# Patient Record
Sex: Male | Born: 1965
Health system: Southern US, Community
[De-identification: ages and names within clinical notes are randomized; demographics above are authoritative.]

## PROBLEM LIST (undated history)

## (undated) DIAGNOSIS — F419 Anxiety disorder, unspecified: Secondary | ICD-10-CM

## (undated) DIAGNOSIS — M51369 Other intervertebral disc degeneration, lumbar region without mention of lumbar back pain or lower extremity pain: Secondary | ICD-10-CM

## (undated) DIAGNOSIS — I471 Supraventricular tachycardia, unspecified: Secondary | ICD-10-CM

## (undated) DIAGNOSIS — E039 Hypothyroidism, unspecified: Secondary | ICD-10-CM

## (undated) DIAGNOSIS — E785 Hyperlipidemia, unspecified: Secondary | ICD-10-CM

## (undated) DIAGNOSIS — T8859XA Other complications of anesthesia, initial encounter: Secondary | ICD-10-CM

## (undated) DIAGNOSIS — F32A Depression, unspecified: Secondary | ICD-10-CM

## (undated) DIAGNOSIS — M5136 Other intervertebral disc degeneration, lumbar region: Secondary | ICD-10-CM

## (undated) DIAGNOSIS — I10 Essential (primary) hypertension: Secondary | ICD-10-CM

## (undated) HISTORY — DX: Other intervertebral disc degeneration, lumbar region without mention of lumbar back pain or lower extremity pain: M51.369

## (undated) HISTORY — PX: BACK SURGERY: SHX140

## (undated) HISTORY — DX: Other intervertebral disc degeneration, lumbar region: M51.36

## (undated) HISTORY — DX: Hyperlipidemia, unspecified: E78.5

## (undated) HISTORY — DX: Depression, unspecified: F32.A

## (undated) HISTORY — DX: Supraventricular tachycardia, unspecified: I47.10

## (undated) HISTORY — DX: Anxiety disorder, unspecified: F41.9

---

## 1998-04-01 ENCOUNTER — Ambulatory Visit (HOSPITAL_COMMUNITY): Admission: RE | Admit: 1998-04-01 | Discharge: 1998-04-01 | Payer: Self-pay

## 1998-08-24 ENCOUNTER — Encounter: Payer: Self-pay | Admitting: Family Medicine

## 1998-08-24 ENCOUNTER — Ambulatory Visit (HOSPITAL_COMMUNITY): Admission: RE | Admit: 1998-08-24 | Discharge: 1998-08-24 | Payer: Self-pay | Admitting: Family Medicine

## 2002-06-10 ENCOUNTER — Ambulatory Visit (HOSPITAL_COMMUNITY): Admission: RE | Admit: 2002-06-10 | Discharge: 2002-06-10 | Payer: Self-pay | Admitting: Family Medicine

## 2002-06-10 ENCOUNTER — Encounter: Payer: Self-pay | Admitting: Family Medicine

## 2006-03-05 ENCOUNTER — Encounter: Payer: Self-pay | Admitting: Cardiology

## 2006-03-05 ENCOUNTER — Ambulatory Visit: Payer: Self-pay

## 2008-09-10 HISTORY — PX: THYROIDECTOMY: SHX17

## 2008-10-18 ENCOUNTER — Encounter: Admission: RE | Admit: 2008-10-18 | Discharge: 2008-10-18 | Payer: Self-pay | Admitting: Endocrinology

## 2008-10-20 ENCOUNTER — Encounter: Admission: RE | Admit: 2008-10-20 | Discharge: 2008-10-20 | Payer: Self-pay | Admitting: Endocrinology

## 2008-10-20 ENCOUNTER — Encounter (INDEPENDENT_AMBULATORY_CARE_PROVIDER_SITE_OTHER): Payer: Self-pay | Admitting: Diagnostic Radiology

## 2008-10-20 ENCOUNTER — Other Ambulatory Visit: Admission: RE | Admit: 2008-10-20 | Discharge: 2008-10-20 | Payer: Self-pay | Admitting: Diagnostic Radiology

## 2009-01-04 ENCOUNTER — Ambulatory Visit (HOSPITAL_COMMUNITY): Admission: RE | Admit: 2009-01-04 | Discharge: 2009-01-05 | Payer: Self-pay | Admitting: Surgery

## 2009-01-04 ENCOUNTER — Encounter (INDEPENDENT_AMBULATORY_CARE_PROVIDER_SITE_OTHER): Payer: Self-pay | Admitting: Surgery

## 2010-09-11 ENCOUNTER — Emergency Department (HOSPITAL_COMMUNITY)
Admission: EM | Admit: 2010-09-11 | Discharge: 2010-09-11 | Payer: Self-pay | Source: Home / Self Care | Admitting: Emergency Medicine

## 2010-12-20 LAB — COMPREHENSIVE METABOLIC PANEL
ALT: 60 U/L — ABNORMAL HIGH (ref 0–53)
Albumin: 3.9 g/dL (ref 3.5–5.2)
Alkaline Phosphatase: 62 U/L (ref 39–117)
Potassium: 4.6 mEq/L (ref 3.5–5.1)
Sodium: 140 mEq/L (ref 135–145)
Total Protein: 6.9 g/dL (ref 6.0–8.3)

## 2010-12-20 LAB — CBC
Hemoglobin: 16.2 g/dL (ref 13.0–17.0)
Platelets: 226 10*3/uL (ref 150–400)
RDW: 12.8 % (ref 11.5–15.5)
WBC: 8.5 10*3/uL (ref 4.0–10.5)

## 2010-12-20 LAB — CALCIUM
Calcium: 8.5 mg/dL (ref 8.4–10.5)
Calcium: 8.8 mg/dL (ref 8.4–10.5)

## 2010-12-20 LAB — DIFFERENTIAL
Basophils Relative: 0 % (ref 0–1)
Eosinophils Absolute: 0.1 10*3/uL (ref 0.0–0.7)
Eosinophils Relative: 1 % (ref 0–5)
Monocytes Absolute: 0.7 10*3/uL (ref 0.1–1.0)
Monocytes Relative: 8 % (ref 3–12)

## 2010-12-20 LAB — URINALYSIS, ROUTINE W REFLEX MICROSCOPIC
Bilirubin Urine: NEGATIVE
Glucose, UA: NEGATIVE mg/dL
Ketones, ur: NEGATIVE mg/dL
pH: 6 (ref 5.0–8.0)

## 2011-01-23 NOTE — Op Note (Signed)
Kenneth [name], Kenneth [name]              ACCOUNT NO.:  [id number]   MEDICAL RECORD NO.:  [id number]          PATIENT TYPE:  OIB   LOCATION:  1527                         FACILITY:  [facility]   PHYSICIAN:  Velora Heckler, MD      DATE OF BIRTH:  Jan 29, 1966   DATE OF PROCEDURE:  01/04/2009  DATE OF DISCHARGE:                               OPERATIVE REPORT   PREOPERATIVE DIAGNOSIS:  Bilateral thyroid nodules with cytologic  atypia.   POSTOPERATIVE DIAGNOSIS:  Bilateral thyroid nodules with cytologic  atypia.   PROCEDURE:  Total thyroidectomy.   SURGEON:  Velora Heckler, MD, FACS   ASSISTANT:  Anselm Pancoast. Zachery Dakins, M.D., FACS   ANESTHESIA:  General per Dr. Ronelle Nigh.   ESTIMATED BLOOD LOSS:  Minimal.   PREPARATION:  ChloraPrep.   COMPLICATIONS:  None.   INDICATIONS:  The patient is a 45 year old white male from [place],  [place].  He had undergone a screening ultrasound examination in  November of 2009 at his church.  On imaging of the carotid arteries, he  was noted with an incidental finding of thyroid nodule.  His primary  physician arranged for endocrine consultation with Dr. Dorisann Frames.  The patient underwent thyroid ultrasound in February of 2010, which  showed a normal size thyroid gland.  However, there was a 1.7-cm complex  nodule with calcifications in the left thyroid lobe and a 1.2-cm nodule  in the right thyroid lobe, again, with calcifications.  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  The  patient is now referred for resection for definitive diagnosis.   DESCRIPTION OF PROCEDURE:  The procedure was done in OR #11 at the  [facility].  The patient was brought to the  operating room, placed in a supine position on the operating room table.  Following administration of general anesthesia, the patient was prepped  and draped in the usual strict aseptic fashion.  After ascertaining that  an adequate  level of anesthesia been achieved, a Kocher incision was  made a #15 blade.  Dissection was carried through subcutaneous tissues  and platysma.  Hemostasis was obtained with electrocautery.  Skin flaps  were elevated cephalad and caudad from the thyroid notch to the sternal  notch.  A Mahorner self-retaining retractor was placed for exposure.  The strap muscles were incised in the midline with the electrocautery.  Dissection was begun on the left.  The strap muscles were reflected  laterally.  The left thyroid lobe was exposed.  The middle thyroid vein  was divided between Ligaclips with the Harmonic scalpel.  The inferior  venous tributaries were divided between Ligaclips with the Harmonic  scalpel.  The superior pole was dissected out.  The superior pole  vessels were ligated in continuity with 2-0 silk ties and divided with  the Harmonic scalpel.  The gland was rolled anteriorly.  Branches of the  inferior thyroid artery were divided between small Ligaclips with the  Harmonic scalpel.  The superior parathyroid gland was identified and  preserved.  The recurrent laryngeal nerve was identified and preserved.  The ligament of Allyson Sabal was transected with the electrocautery and the  gland was mobilized up and onto the anterior trachea.  There was a small  pyramidal lobe, which was dissected off of the thyroid cartilage with  the electrocautery and resected en bloc with the thyroid.  The isthmus  was mobilized across the midline.  A dry pack was placed in the left  neck.   Next we turned our attention to the right thyroid lobe.  Again, the  strap muscles were reflected laterally.  The right lobe was exposed.  It  contained multiple palpable nodules.  The middle vein was divided  between Ligaclips with the Harmonic scalpel.  The inferior venous  tributaries were ligated in continuity with 2-0 silk ties and divided  with the Harmonic scalpel.  The superior pole was dissected out and  divided  between medium Ligaclips with the Harmonic scalpel.  The gland  was rolled anteriorly.  Branches of the inferior thyroid artery were  divided between small Ligaclips.  The recurrent nerve was identified and  preserved.  The superior parathyroid gland was identified and preserved.  The ligament of Allyson Sabal was transected with the electrocautery and the  gland was excised off the anterior trachea.  The specimen was marked  with a suture in the left superior pole.  The entire thyroid gland was  submitted to pathology for review.  The neck was irrigated with warm  saline.  Surgicel was placed in the operative field bilaterally.  The  strap muscles were reapproximated in the midline with interrupted 3-0  Vicryl sutures.  The platysma was closed with interrupted 3-0 Vicryl  sutures.  The skin was closed with running 4-0 Monocryl subcuticular  suture.  The wound was washed and dried and benzoin and Steri-Strips  were applied.  Sterile dressings were applied.  The patient was awakened  from anesthesia and brought to the recovery room in stable condition.  The patient tolerated the procedure well.      Velora Heckler, MD  Electronically Signed     TMG/MEDQ  D:  01/04/2009  T:  01/04/2009  Job:  [id number]   cc:   Dorisann Frames, M.D.  Fax: [phone]   Ernestina Penna, M.D.  Fax: [phone]

## 2011-03-28 ENCOUNTER — Other Ambulatory Visit: Payer: Self-pay | Admitting: Neurosurgery

## 2011-03-28 DIAGNOSIS — M549 Dorsalgia, unspecified: Secondary | ICD-10-CM

## 2011-03-28 DIAGNOSIS — M5410 Radiculopathy, site unspecified: Secondary | ICD-10-CM

## 2011-04-02 MED ORDER — DEXTROSE 5 % IV SOLN
1.0000 g | Freq: Once | INTRAVENOUS | Status: AC
  Administered 2011-04-03: 1 g via INTRAVENOUS

## 2011-04-02 MED ORDER — SODIUM CHLORIDE 0.9 % IV SOLN
Freq: Once | INTRAVENOUS | Status: AC
  Administered 2011-04-03: 08:00:00 via INTRAVENOUS

## 2011-04-03 ENCOUNTER — Ambulatory Visit
Admission: RE | Admit: 2011-04-03 | Discharge: 2011-04-03 | Disposition: A | Discharge status: Home / Self Care | Payer: BC Managed Care – PPO | Source: Ambulatory Visit | Attending: Neurosurgery | Admitting: Neurosurgery

## 2011-04-03 ENCOUNTER — Other Ambulatory Visit: Payer: Self-pay | Admitting: Neurosurgery

## 2011-04-03 VITALS — BP 105/69 | HR 75 | Temp 98.4°F | Resp 14 | Ht 70.0 in | Wt 168.0 lb

## 2011-04-03 DIAGNOSIS — M5410 Radiculopathy, site unspecified: Secondary | ICD-10-CM

## 2011-04-03 DIAGNOSIS — M549 Dorsalgia, unspecified: Secondary | ICD-10-CM

## 2011-04-03 HISTORY — DX: Hypothyroidism, unspecified: E03.9

## 2011-04-03 HISTORY — DX: Essential (primary) hypertension: I10

## 2011-04-03 MED ORDER — FENTANYL CITRATE 0.05 MG/ML IJ SOLN
25.0000 ug | INTRAMUSCULAR | Status: DC | PRN
  Administered 2011-04-03: 100 ug via INTRAVENOUS

## 2011-04-03 MED ORDER — IOHEXOL 180 MG/ML  SOLN: 4.0000 mL | Freq: Once | INTRAMUSCULAR | Status: DC | PRN

## 2011-04-03 MED ORDER — IOHEXOL 180 MG/ML  SOLN
4.0000 mL | Freq: Once | INTRAMUSCULAR | Status: AC | PRN
  Administered 2011-04-03: 4 mL

## 2011-04-03 MED ORDER — KETOROLAC TROMETHAMINE 30 MG/ML IJ SOLN
30.0000 mg | Freq: Once | INTRAMUSCULAR | Status: AC
  Administered 2011-04-03: 30 mg via INTRAVENOUS

## 2011-04-03 MED ORDER — DEXTROSE-NACL 5-0.45 % IV SOLN: INTRAVENOUS | Status: DC

## 2011-04-03 MED ORDER — MIDAZOLAM HCL 2 MG/2ML IJ SOLN
1.0000 mg | INTRAMUSCULAR | Status: DC | PRN
  Administered 2011-04-03 (×2): 1 mg via INTRAVENOUS

## 2011-04-03 NOTE — Progress Notes (Addendum)
Reviewed health hx. with pt.  Takes thyroid meds, and has high bp. No other surgeries or problems noted. [phone] lungs clear bilaterally and heart sounds regular. Denies smoking.dd

## 2011-07-10 ENCOUNTER — Ambulatory Visit: Payer: BC Managed Care – PPO | Attending: Neurosurgery | Admitting: Physical Therapy

## 2011-07-10 DIAGNOSIS — M545 Low back pain, unspecified: Secondary | ICD-10-CM | POA: Insufficient documentation

## 2011-07-10 DIAGNOSIS — IMO0001 Reserved for inherently not codable concepts without codable children: Secondary | ICD-10-CM | POA: Insufficient documentation

## 2011-07-10 DIAGNOSIS — R5381 Other malaise: Secondary | ICD-10-CM | POA: Insufficient documentation

## 2011-07-12 ENCOUNTER — Ambulatory Visit: Payer: BC Managed Care – PPO | Attending: Neurosurgery | Admitting: *Deleted

## 2011-07-12 DIAGNOSIS — M545 Low back pain, unspecified: Secondary | ICD-10-CM | POA: Insufficient documentation

## 2011-07-12 DIAGNOSIS — R5381 Other malaise: Secondary | ICD-10-CM | POA: Insufficient documentation

## 2011-07-12 DIAGNOSIS — IMO0001 Reserved for inherently not codable concepts without codable children: Secondary | ICD-10-CM | POA: Insufficient documentation

## 2011-07-17 ENCOUNTER — Ambulatory Visit: Payer: BC Managed Care – PPO | Admitting: *Deleted

## 2011-07-19 ENCOUNTER — Ambulatory Visit: Payer: BC Managed Care – PPO | Admitting: *Deleted

## 2011-07-24 ENCOUNTER — Ambulatory Visit: Payer: BC Managed Care – PPO | Admitting: *Deleted

## 2011-07-26 ENCOUNTER — Ambulatory Visit: Payer: BC Managed Care – PPO | Admitting: *Deleted

## 2011-07-31 ENCOUNTER — Ambulatory Visit: Payer: BC Managed Care – PPO | Admitting: *Deleted

## 2011-08-07 ENCOUNTER — Encounter: Payer: BC Managed Care – PPO | Admitting: *Deleted

## 2011-08-09 ENCOUNTER — Ambulatory Visit: Payer: BC Managed Care – PPO | Admitting: *Deleted

## 2011-08-14 ENCOUNTER — Ambulatory Visit: Payer: BC Managed Care – PPO | Attending: Neurosurgery | Admitting: *Deleted

## 2011-08-14 DIAGNOSIS — M545 Low back pain, unspecified: Secondary | ICD-10-CM | POA: Insufficient documentation

## 2011-08-14 DIAGNOSIS — R5381 Other malaise: Secondary | ICD-10-CM | POA: Insufficient documentation

## 2011-08-14 DIAGNOSIS — IMO0001 Reserved for inherently not codable concepts without codable children: Secondary | ICD-10-CM | POA: Insufficient documentation

## 2011-08-16 ENCOUNTER — Ambulatory Visit: Payer: BC Managed Care – PPO | Admitting: Physical Therapy

## 2011-08-21 ENCOUNTER — Ambulatory Visit: Payer: BC Managed Care – PPO | Admitting: *Deleted

## 2011-08-23 ENCOUNTER — Ambulatory Visit: Payer: BC Managed Care – PPO | Admitting: *Deleted

## 2011-08-28 ENCOUNTER — Ambulatory Visit: Payer: BC Managed Care – PPO | Admitting: *Deleted

## 2011-08-30 ENCOUNTER — Ambulatory Visit: Payer: BC Managed Care – PPO | Admitting: *Deleted

## 2011-09-06 ENCOUNTER — Ambulatory Visit: Payer: BC Managed Care – PPO | Admitting: Physical Therapy

## 2011-09-10 ENCOUNTER — Ambulatory Visit: Payer: BC Managed Care – PPO | Admitting: Physical Therapy

## 2011-09-13 ENCOUNTER — Encounter: Payer: BC Managed Care – PPO | Admitting: *Deleted

## 2012-12-04 ENCOUNTER — Other Ambulatory Visit: Payer: Self-pay | Admitting: Family Medicine

## 2012-12-04 MED ORDER — OXYCODONE-ACETAMINOPHEN 10-325 MG PO TABS: 1.0000 | ORAL_TABLET | Freq: Three times a day (TID) | ORAL | Status: DC | PRN

## 2012-12-04 MED ORDER — ZOLPIDEM TARTRATE 10 MG PO TABS: 10.0000 mg | ORAL_TABLET | Freq: Every evening | ORAL | Status: DC | PRN

## 2012-12-04 MED ORDER — ALPRAZOLAM 0.5 MG PO TABS: 0.5000 mg | ORAL_TABLET | Freq: Every evening | ORAL | Status: DC | PRN

## 2012-12-04 NOTE — Telephone Encounter (Signed)
RX UP FRONT TO PICK UP AND PT AWARE THAT HE NEEDS TO CALL PHARMACY AND REQUEST MEDS

## 2012-12-04 NOTE — Telephone Encounter (Signed)
Pt to pick up Rx. Have him call pharmacy in future to send electronically so can see when had last filled.

## 2012-12-06 ENCOUNTER — Telehealth: Payer: Self-pay | Admitting: *Deleted

## 2012-12-08 ENCOUNTER — Other Ambulatory Visit: Payer: Self-pay | Admitting: Nurse Practitioner

## 2012-12-08 MED ORDER — LISINOPRIL-HYDROCHLOROTHIAZIDE 10-12.5 MG PO TABS: 1.0000 | ORAL_TABLET | Freq: Every day | ORAL | Status: DC

## 2012-12-08 NOTE — Telephone Encounter (Signed)
Done this morning 

## 2012-12-08 NOTE — Telephone Encounter (Signed)
Needs his blood pressure med sent into the pharmacy

## 2012-12-08 NOTE — Telephone Encounter (Signed)
Thought this was about pain meds. Approved RX. Sent to [facility]

## 2012-12-08 NOTE — Telephone Encounter (Signed)
Patient notiifed that meds were sent to pharmacy

## 2013-01-15 ENCOUNTER — Other Ambulatory Visit: Payer: Self-pay | Admitting: Nurse Practitioner

## 2013-01-16 NOTE — Telephone Encounter (Signed)
Last TSH 11-06-11,  Has appt at the end of May

## 2013-01-26 ENCOUNTER — Ambulatory Visit (INDEPENDENT_AMBULATORY_CARE_PROVIDER_SITE_OTHER): Payer: BC Managed Care – PPO | Admitting: Nurse Practitioner

## 2013-01-26 ENCOUNTER — Encounter: Payer: Self-pay | Admitting: Nurse Practitioner

## 2013-01-26 VITALS — BP 142/89 | HR 84 | Temp 98.6°F | Wt 173.5 lb

## 2013-01-26 DIAGNOSIS — E785 Hyperlipidemia, unspecified: Secondary | ICD-10-CM

## 2013-01-26 DIAGNOSIS — M5030 Other cervical disc degeneration, unspecified cervical region: Secondary | ICD-10-CM

## 2013-01-26 DIAGNOSIS — F32A Depression, unspecified: Secondary | ICD-10-CM

## 2013-01-26 DIAGNOSIS — M5136 Other intervertebral disc degeneration, lumbar region: Secondary | ICD-10-CM | POA: Insufficient documentation

## 2013-01-26 DIAGNOSIS — R911 Solitary pulmonary nodule: Secondary | ICD-10-CM

## 2013-01-26 DIAGNOSIS — F3289 Other specified depressive episodes: Secondary | ICD-10-CM

## 2013-01-26 DIAGNOSIS — R9439 Abnormal result of other cardiovascular function study: Secondary | ICD-10-CM

## 2013-01-26 DIAGNOSIS — E039 Hypothyroidism, unspecified: Secondary | ICD-10-CM

## 2013-01-26 DIAGNOSIS — F329 Major depressive disorder, single episode, unspecified: Secondary | ICD-10-CM

## 2013-01-26 LAB — COMPLETE METABOLIC PANEL WITH GFR
ALT: 26 U/L (ref 0–53)
AST: 25 U/L (ref 0–37)
Albumin: 4.8 g/dL (ref 3.5–5.2)
Calcium: 9.5 mg/dL (ref 8.4–10.5)
Chloride: 105 mEq/L (ref 96–112)
Potassium: 4.1 mEq/L (ref 3.5–5.3)
Sodium: 140 mEq/L (ref 135–145)

## 2013-01-26 LAB — THYROID PANEL WITH TSH
Free Thyroxine Index: 3.3 (ref 1.0–3.9)
TSH: 19.561 u[IU]/mL — ABNORMAL HIGH (ref 0.350–4.500)

## 2013-01-26 MED ORDER — BUPROPION HCL ER (XL) 150 MG PO TB24: 150.0000 mg | ORAL_TABLET | Freq: Every day | ORAL | Status: DC

## 2013-01-26 MED ORDER — GABAPENTIN 300 MG PO CAPS: 300.0000 mg | ORAL_CAPSULE | Freq: Three times a day (TID) | ORAL | Status: DC

## 2013-01-26 NOTE — Progress Notes (Signed)
Subjective:    Patient ID: Kenneth [name], male    DOB: 08/06/1966, 47 y.o.   MRN: [id number]  Hyperlipidemia This is a chronic problem. The current episode started more than 1 year ago. The problem is controlled. Recent lipid tests were reviewed and are normal. Exacerbating diseases include hypothyroidism. There are no known factors aggravating his hyperlipidemia. Pertinent negatives include no chest pain, leg pain, myalgias or shortness of breath. Current antihyperlipidemic treatment includes fibric acid derivatives. The current treatment provides moderate improvement of lipids. Risk factors for coronary artery disease include hypertension and male sex.  Thyroid Problem Presents for follow-up visit. Patient reports no anxiety, diaphoresis, fatigue, hair loss, hoarse voice, leg swelling, nail problem, palpitations, weight gain or weight loss. The symptoms have been stable. His past medical history is significant for hyperlipidemia.  Hypertension This is a chronic problem. The current episode started more than 1 year ago. The problem is unchanged. The problem is controlled (had severla days at home where he was fatigued and blood pressure was in the 80's systolic. Stopped blood pressure meds for a few days. Came up and now back on all meds now.). Pertinent negatives include no chest pain, palpitations, peripheral edema or shortness of breath. There are no associated agents to hypertension. Risk factors for coronary artery disease include dyslipidemia and male gender. Past treatments include ACE inhibitors. The current treatment provides moderate improvement. Hypertensive end-organ damage includes a thyroid problem.  depression celexa- working well. Able to tolerate day to day situations with out getting angry- Doesn't seem to be working as well as it use to. Patient says he has been having sexual side effects- patient wants to switch to wellbutrin. Insomnia Sleeping okay on ambien. Usually feels  rested in AM. DDD Only having nerve pain- Only takes percocet as needed- Use o be on gabapentin and wondered if will help if takes again.   Review of Systems  Constitutional: Negative for weight loss, weight gain, diaphoresis and fatigue.  HENT: Negative for hoarse voice.   Respiratory: Negative for shortness of breath.   Cardiovascular: Negative for chest pain and palpitations.  Musculoskeletal: Negative for myalgias.  All other systems reviewed and are negative.       Objective:   Physical Exam  Constitutional: He is oriented to person, place, and time. He appears well-developed and well-nourished.  HENT:  Head: Normocephalic.  Right Ear: External ear normal.  Left Ear: External ear normal.  Nose: Nose normal.  Mouth/Throat: Oropharynx is clear and moist.  Eyes: EOM are normal. Pupils are equal, round, and reactive to light.  Neck: Normal range of motion. Neck supple. No thyromegaly present.  Cardiovascular: Normal rate, regular rhythm, normal heart sounds and intact distal pulses.   No murmur heard. Pulmonary/Chest: Effort normal and breath sounds normal. He has no wheezes. He has no rales.  Abdominal: Soft. Bowel sounds are normal.  Musculoskeletal: Normal range of motion.  Neurological: He is alert and oriented to person, place, and time.  Skin: Skin is warm and dry.  Psychiatric: He has a normal mood and affect. His behavior is normal. Judgment and thought content normal.   BP 142/89  Pulse 84  Temp(Src) 98.6 F (37 C) (Oral)  Wt 173 lb 8 oz (78.699 kg)  BMI 24.89 kg/m2        Assessment & Plan:  1. Hyperlipidemia Low fat diet and exercise - COMPLETE METABOLIC PANEL WITH GFR - NMR Lipoprofile with Lipids  2. Hypothyroidism Labs pending Continue current dose of  levothyroxine - Thyroid Panel With TSH  3. DDD (degenerative disc disease), cervical Rest - gabapentin (NEURONTIN) 300 MG capsule; Take 1 capsule (300 mg total) by mouth 3 (three) times daily.   Dispense: 90 capsule; Refill: 3  4. Depression Stress management Stop celexa - buPROPion (WELLBUTRIN XL) 150 MG 24 hr tablet; Take 1 tablet (150 mg total) by mouth daily.  Dispense: 30 tablet; Refill: 5  5. Abnormal stress test Repeat stress test from 5 years ago - Ambulatory referral to Cardiology  6. Lung nodule Follow- up chest ct   - CT Chest W Contrast; Future  Mary-Margaret Daphine Deutscher, FNP

## 2013-01-26 NOTE — Patient Instructions (Signed)
Health Maintenance, Males A healthy lifestyle and preventative care can promote health and wellness.  Maintain regular health, dental, and eye exams.  Eat a healthy diet. Foods like vegetables, fruits, whole grains, low-fat dairy products, and lean protein foods contain the nutrients you need without too many calories. Decrease your intake of foods high in solid fats, added sugars, and salt. Get information about a proper diet from your caregiver, if necessary.  Regular physical exercise is one of the most important things you can do for your health. Most adults should get at least 150 minutes of moderate-intensity exercise (any activity that increases your heart rate and causes you to sweat) each week. In addition, most adults need muscle-strengthening exercises on 2 or more days a week.   Maintain a healthy weight. The body mass index (BMI) is a screening tool to identify possible weight problems. It provides an estimate of body fat based on height and weight. Your caregiver can help determine your BMI, and can help you achieve or maintain a healthy weight. For adults 20 years and older:  A BMI below 18.5 is considered underweight.  A BMI of 18.5 to 24.9 is normal.  A BMI of 25 to 29.9 is considered overweight.  A BMI of 30 and above is considered obese.  Maintain normal blood lipids and cholesterol by exercising and minimizing your intake of saturated fat. Eat a balanced diet with plenty of fruits and vegetables. Blood tests for lipids and cholesterol should begin at age 20 and be repeated every 5 years. If your lipid or cholesterol levels are high, you are over 50, or you are a high risk for heart disease, you may need your cholesterol levels checked more frequently.Ongoing high lipid and cholesterol levels should be treated with medicines, if diet and exercise are not effective.  If you smoke, find out from your caregiver how to quit. If you do not use tobacco, do not start.  If you  choose to drink alcohol, do not exceed 2 drinks per day. One drink is considered to be 12 ounces (355 mL) of beer, 5 ounces (148 mL) of wine, or 1.5 ounces (44 mL) of liquor.  Avoid use of street drugs. Do not share needles with anyone. Ask for help if you need support or instructions about stopping the use of drugs.  High blood pressure causes heart disease and increases the risk of stroke. Blood pressure should be checked at least every 1 to 2 years. Ongoing high blood pressure should be treated with medicines if weight loss and exercise are not effective.  If you are 45 to 46 years old, ask your caregiver if you should take aspirin to prevent heart disease.  Diabetes screening involves taking a blood sample to check your fasting blood sugar level. This should be done once every 3 years, after age 45, if you are within normal weight and without risk factors for diabetes. Testing should be considered at a younger age or be carried out more frequently if you are overweight and have at least 1 risk factor for diabetes.  Colorectal cancer can be detected and often prevented. Most routine colorectal cancer screening begins at the age of 50 and continues through age 75. However, your caregiver may recommend screening at an earlier age if you have risk factors for colon cancer. On a yearly basis, your caregiver may provide home test kits to check for hidden blood in the stool. Use of a small camera at the end of a tube,   to directly examine the colon (sigmoidoscopy or colonoscopy), can detect the earliest forms of colorectal cancer. Talk to your caregiver about this at age 50, when routine screening begins. Direct examination of the colon should be repeated every 5 to 10 years through age 75, unless early forms of pre-cancerous polyps or small growths are found.  Hepatitis C blood testing is recommended for all people born from 1945 through 1965 and any individual with known risks for hepatitis C.  Healthy  men should no longer receive prostate-specific antigen (PSA) blood tests as part of routine cancer screening. Consult with your caregiver about prostate cancer screening.  Testicular cancer screening is not recommended for adolescents or adult males who have no symptoms. Screening includes self-exam, caregiver exam, and other screening tests. Consult with your caregiver about any symptoms you have or any concerns you have about testicular cancer.  Practice safe sex. Use condoms and avoid high-risk sexual practices to reduce the spread of sexually transmitted infections (STIs).  Use sunscreen with a sun protection factor (SPF) of 30 or greater. Apply sunscreen liberally and repeatedly throughout the day. You should seek shade when your shadow is shorter than you. Protect yourself by wearing long sleeves, pants, a wide-brimmed hat, and sunglasses year round, whenever you are outdoors.  Notify your caregiver of new moles or changes in moles, especially if there is a change in shape or color. Also notify your caregiver if a mole is larger than the size of a pencil eraser.  A one-time screening for abdominal aortic aneurysm (AAA) and surgical repair of large AAAs by sound wave imaging (ultrasonography) is recommended for ages 65 to 75 years who are current or former smokers.  Stay current with your immunizations. Document Released: 02/23/2008 Document Revised: 11/19/2011 Document Reviewed: 01/22/2011 Exit[employer] Patient Information 2013 [employer].  

## 2013-01-27 ENCOUNTER — Other Ambulatory Visit: Payer: Self-pay

## 2013-01-27 DIAGNOSIS — R918 Other nonspecific abnormal finding of lung field: Secondary | ICD-10-CM

## 2013-01-30 ENCOUNTER — Ambulatory Visit (HOSPITAL_COMMUNITY)
Admission: RE | Admit: 2013-01-30 | Discharge: 2013-01-30 | Disposition: A | Discharge status: Home / Self Care | Payer: BC Managed Care – PPO | Source: Ambulatory Visit | Attending: Nurse Practitioner | Admitting: Nurse Practitioner

## 2013-01-30 DIAGNOSIS — R918 Other nonspecific abnormal finding of lung field: Secondary | ICD-10-CM

## 2013-01-30 DIAGNOSIS — R911 Solitary pulmonary nodule: Secondary | ICD-10-CM | POA: Insufficient documentation

## 2013-01-30 LAB — NMR LIPOPROFILE WITH LIPIDS
Cholesterol, Total: 285 mg/dL — ABNORMAL HIGH (ref <200)
HDL Particle Number: 34.5 umol/L (ref >=30.5)
LDL (calc): 213 mg/dL — ABNORMAL HIGH (ref <100)
LP-IR Score: 77 — ABNORMAL HIGH (ref <=45)
Large VLDL-P: 4.6 nmol/L — ABNORMAL HIGH (ref <=2.7)
Small LDL Particle Number: 1319 nmol/L — ABNORMAL HIGH (ref <=527)

## 2013-02-03 ENCOUNTER — Other Ambulatory Visit: Payer: Self-pay | Admitting: Nurse Practitioner

## 2013-02-03 MED ORDER — ATORVASTATIN CALCIUM 40 MG PO TABS: 40.0000 mg | ORAL_TABLET | Freq: Every day | ORAL | Status: DC

## 2013-02-03 MED ORDER — CHOLINE FENOFIBRATE 135 MG PO CPDR: 135.0000 mg | DELAYED_RELEASE_CAPSULE | Freq: Every day | ORAL | Status: DC

## 2013-02-03 MED ORDER — LEVOTHYROXINE SODIUM 150 MCG PO TABS: 150.0000 ug | ORAL_TABLET | Freq: Every day | ORAL | Status: DC

## 2013-02-04 ENCOUNTER — Other Ambulatory Visit: Payer: Self-pay | Admitting: Nurse Practitioner

## 2013-02-04 MED ORDER — ZOLPIDEM TARTRATE 10 MG PO TABS: 10.0000 mg | ORAL_TABLET | Freq: Every evening | ORAL | Status: DC | PRN

## 2013-02-04 NOTE — Progress Notes (Signed)
Script called to [facility]. 

## 2013-02-15 ENCOUNTER — Other Ambulatory Visit: Payer: Self-pay | Admitting: Nurse Practitioner

## 2013-02-16 ENCOUNTER — Other Ambulatory Visit: Payer: Self-pay | Admitting: Nurse Practitioner

## 2013-02-16 MED ORDER — OXYCODONE-ACETAMINOPHEN 10-325 MG PO TABS: 1.0000 | ORAL_TABLET | Freq: Three times a day (TID) | ORAL | Status: DC | PRN

## 2013-03-11 ENCOUNTER — Encounter: Payer: Self-pay | Admitting: Cardiovascular Disease

## 2013-03-11 ENCOUNTER — Ambulatory Visit (INDEPENDENT_AMBULATORY_CARE_PROVIDER_SITE_OTHER): Payer: BC Managed Care – PPO | Admitting: Cardiovascular Disease

## 2013-03-11 VITALS — BP 132/90 | HR 80 | Ht 70.0 in | Wt 170.0 lb

## 2013-03-11 DIAGNOSIS — E785 Hyperlipidemia, unspecified: Secondary | ICD-10-CM

## 2013-03-11 NOTE — Progress Notes (Signed)
[name] Date of Birth  [birth year]       [facility]    [place] [address]  [address] [place], [place]  [address]   [place], [place]  [address] [phone]     [phone]   Fax  [phone]    Fax [phone]  Problem List: 1. hyperlipidemia 2. Hypertension 3. Hypothyroidism 4. S/p thyroidectomy   History of Present Illness:  Illene [name]  presents today for advice an cardiac disease.  Both his parents have had CAD and he wants to be proactive .  He has not had any problems of CP  Or dyspnea.  His mother has HOCM - his echo showed no HOCM.  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    He has not been exercising as much over the past 2 years because of back surgeries.    He has some nerve damage in his legs.  He is able to work out on the elliptical.    His triglycerides have been as high as 2300.  His LDL particle number is 2340.  Father has had CABG. Mother has HOCM, DM   Current Outpatient Prescriptions on File Prior to Visit  Medication Sig Dispense Refill  . ALPRAZolam (XANAX) 0.5 MG tablet Take 1 tablet (0.5 mg total) by mouth at bedtime as needed for sleep.  60 tablet  0  . atorvastatin (LIPITOR) 40 MG tablet Take 1 tablet (40 mg total) by mouth daily.  30 tablet  3  . buPROPion (WELLBUTRIN XL) 150 MG 24 hr tablet Take 1 tablet (150 mg total) by mouth daily.  30 tablet  5  . Choline Fenofibrate (TRILIPIX) 135 MG capsule Take 1 capsule (135 mg total) by mouth daily.  30 capsule  5  . gabapentin (NEURONTIN) 300 MG capsule Take 1 capsule (300 mg total) by mouth 3 (three) times daily.  90 capsule  3  . levothyroxine (SYNTHROID, LEVOTHROID) 150 MCG tablet Take 1 tablet (150 mcg total) by mouth daily.  30 tablet  3  . lisinopril-hydrochlorothiazide (PRINZIDE,ZESTORETIC) 10-12.5 MG per tablet Take 1 tablet by mouth daily.  90 tablet  1  . oxyCODONE-acetaminophen (PERCOCET) 10-325 MG per tablet Take 1  tablet by mouth every 8 (eight) hours as needed for pain.  60 tablet  0  . zolpidem (AMBIEN) 10 MG tablet Take 1 tablet (10 mg total) by mouth at bedtime as needed for sleep.  30 tablet  0   No current facility-administered medications on file prior to visit.    No Known Allergies  Past Medical History  Diagnosis Date  . Hypertension   . Hyperlipidemia   . DDD (degenerative disc disease), lumbar   . Hypothyroidism     Past Surgical History  Procedure Laterality Date  . Back surgery      disectomy  . Thyroidectomy  2010    History  Smoking status  . Never Smoker   Smokeless tobacco  . Not on file    History  Alcohol Use No    Family History  Problem Relation Age of Onset  . Diabetes Mother   . Hypertension Mother   . Stroke Mother   . Hyperlipidemia Father   . Hypertension Father   . Heart attack Father   . Diabetes Father     Reviw of Systems:  Reviewed in the HPI.  All other systems are negative.  Physical Exam: Blood pressure  132/90, pulse 80, height 5\' 10"  (1.778 m), weight 170 lb (77.111 kg). General: Well developed, well nourished, in no acute distress.  Head: Normocephalic, atraumatic, sclera non-icteric, mucus membranes are moist,   Neck: Supple. Carotids are 2 + without bruits. No JVD   Lungs: Clear   Heart: RR, normal S1, s2  Abdomen: Soft, non-tender, non-distended with normal bowel sounds.  Msk:  Strength and tone are normal   Extremities: No clubbing or cyanosis. No edema.  Distal pedal pulses are 2+ and equal    Neuro: CN II - XII intact.  Alert and oriented X 3.   Psych:  Normal   ECG: March 11, 2013:  NSR at 80.  Normal   Assessment / Plan:

## 2013-03-11 NOTE — Patient Instructions (Addendum)
Your physician recommends that you schedule a follow-up appointment in: as needed basis/ call if need cardiac appointment.  Your physician recommends that you continue on your current medications as directed. Please refer to the Current Medication list given to you today.

## 2013-03-11 NOTE — Assessment & Plan Note (Signed)
Kenneth [name] is has made progress with his hyperlipidemia. He's currently on atorvastatin and fenofibrate. His triglyceride levels have improved dramatically. His LDL particle number still very elevated-2340.  I would continue to titrate up his atorvastatin as needed. If the atorvastatin does not lower his cholesterol and off we could consider changing to Crestor. We also could consider adding Zetia for further cholesterol lowering.  We will see him on as needed basis.

## 2013-03-24 ENCOUNTER — Other Ambulatory Visit (INDEPENDENT_AMBULATORY_CARE_PROVIDER_SITE_OTHER): Payer: BC Managed Care – PPO

## 2013-03-24 DIAGNOSIS — I10 Essential (primary) hypertension: Secondary | ICD-10-CM

## 2013-03-24 DIAGNOSIS — E039 Hypothyroidism, unspecified: Secondary | ICD-10-CM

## 2013-03-24 LAB — BASIC METABOLIC PANEL WITH GFR
BUN: 18 mg/dL (ref 6–23)
Chloride: 103 mEq/L (ref 96–112)
Creat: 1.38 mg/dL — ABNORMAL HIGH (ref 0.50–1.35)
GFR, Est Non African American: 61 mL/min

## 2013-03-24 LAB — THYROID PANEL WITH TSH: Free Thyroxine Index: 4 — ABNORMAL HIGH (ref 1.0–3.9)

## 2013-03-27 ENCOUNTER — Telehealth: Payer: Self-pay | Admitting: Nurse Practitioner

## 2013-03-30 NOTE — Telephone Encounter (Signed)
Pt aware.

## 2013-04-01 ENCOUNTER — Other Ambulatory Visit: Payer: Self-pay

## 2013-04-01 MED ORDER — ALPRAZOLAM 0.5 MG PO TABS: 0.5000 mg | ORAL_TABLET | Freq: Every evening | ORAL | Status: DC | PRN

## 2013-04-01 NOTE — Telephone Encounter (Signed)
Last seen 01/26/13  MMM   If approved route to nurse to phone in and notify patient

## 2013-04-01 NOTE — Telephone Encounter (Signed)
This is okay.

## 2013-04-02 ENCOUNTER — Other Ambulatory Visit: Payer: Self-pay

## 2013-04-02 NOTE — Telephone Encounter (Signed)
Last seen MMM 01/26/13  If approved route and have nurse call in

## 2013-04-03 MED ORDER — ZOLPIDEM TARTRATE 10 MG PO TABS: 10.0000 mg | ORAL_TABLET | Freq: Every evening | ORAL | Status: DC | PRN

## 2013-04-06 NOTE — Telephone Encounter (Signed)
Called into [facility].  

## 2013-04-17 ENCOUNTER — Other Ambulatory Visit: Payer: Self-pay | Admitting: *Deleted

## 2013-04-17 MED ORDER — OXYCODONE-ACETAMINOPHEN 10-325 MG PO TABS: 1.0000 | ORAL_TABLET | Freq: Three times a day (TID) | ORAL | Status: DC | PRN

## 2013-04-17 NOTE — Telephone Encounter (Signed)
Patient last seen in office on 01-26-13. Rx last filled on 02-27-13 for #60. Please advise. Rx will print. Please route to Pool B so nurse can call pt to pick up

## 2013-04-17 NOTE — Telephone Encounter (Signed)
rx ready for pickup 

## 2013-04-17 NOTE — Telephone Encounter (Signed)
Up front 

## 2013-05-21 ENCOUNTER — Encounter: Payer: Self-pay | Admitting: Nurse Practitioner

## 2013-05-21 ENCOUNTER — Ambulatory Visit (INDEPENDENT_AMBULATORY_CARE_PROVIDER_SITE_OTHER): Payer: BC Managed Care – PPO | Admitting: Nurse Practitioner

## 2013-05-21 VITALS — BP 113/76 | HR 83 | Temp 98.3°F | Ht 71.0 in | Wt 172.0 lb

## 2013-05-21 DIAGNOSIS — M545 Low back pain, unspecified: Secondary | ICD-10-CM

## 2013-05-21 MED ORDER — ALPRAZOLAM 0.5 MG PO TABS: 0.5000 mg | ORAL_TABLET | Freq: Every evening | ORAL | Status: DC | PRN

## 2013-05-21 MED ORDER — OXYCODONE-ACETAMINOPHEN 10-325 MG PO TABS: 1.0000 | ORAL_TABLET | Freq: Three times a day (TID) | ORAL | Status: DC | PRN

## 2013-05-21 MED ORDER — METHYLPREDNISOLONE ACETATE 80 MG/ML IJ SUSP
80.0000 mg | Freq: Once | INTRAMUSCULAR | Status: AC
  Administered 2013-05-21: 80 mg via INTRAMUSCULAR

## 2013-05-21 MED ORDER — PREDNISONE 20 MG PO TABS: ORAL_TABLET | ORAL | Status: DC

## 2013-05-21 MED ORDER — ZOLPIDEM TARTRATE 10 MG PO TABS: 10.0000 mg | ORAL_TABLET | Freq: Every evening | ORAL | Status: DC | PRN

## 2013-05-21 NOTE — Addendum Note (Signed)
Addended by: Bennie Pierini on: 05/21/2013 10:49 AM   Modules accepted: Orders

## 2013-05-21 NOTE — Patient Instructions (Signed)
Back Pain, Adult  Low back pain is very common. About 1 in 5 people have back pain. The cause of low back pain is rarely dangerous. The pain often gets better over time. About half of people with a sudden onset of back pain feel better in just 2 weeks. About 8 in 10 people feel better by 6 weeks.   CAUSES  Some common causes of back pain include:  · Strain of the muscles or ligaments supporting the spine.  · Wear and tear (degeneration) of the spinal discs.  · Arthritis.  · Direct injury to the back.  DIAGNOSIS  Most of the time, the direct cause of low back pain is not known. However, back pain can be treated effectively even when the exact cause of the pain is unknown. Answering your caregiver's questions about your overall health and symptoms is one of the most accurate ways to make sure the cause of your pain is not dangerous. If your caregiver needs more information, he or she may order lab work or imaging tests (X-rays or MRIs). However, even if imaging tests show changes in your back, this usually does not require surgery.  HOME CARE INSTRUCTIONS  For many people, back pain returns. Since low back pain is rarely dangerous, it is often a condition that people can learn to manage on their own.   · Remain active. It is stressful on the back to sit or stand in one place. Do not sit, drive, or stand in one place for more than 30 minutes at a time. Take short walks on level surfaces as soon as pain allows. Try to increase the length of time you walk each day.  · Do not stay in bed. Resting more than 1 or 2 days can delay your recovery.  · Do not avoid exercise or work. Your body is made to move. It is not dangerous to be active, even though your back may hurt. Your back will likely heal faster if you return to being active before your pain is gone.  · Pay attention to your body when you  bend and lift. Many people have less discomfort when lifting if they bend their knees, keep the load close to their bodies, and  avoid twisting. Often, the most comfortable positions are those that put less stress on your recovering back.  · Find a comfortable position to sleep. Use a firm mattress and lie on your side with your knees slightly bent. If you lie on your back, put a pillow under your knees.  · Only take over-the-counter or prescription medicines as directed by your caregiver. Over-the-counter medicines to reduce pain and inflammation are often the most helpful. Your caregiver may prescribe muscle relaxant drugs. These medicines help dull your pain so you can more quickly return to your normal activities and healthy exercise.  · Put ice on the injured area.  · Put ice in a plastic bag.  · Place a towel between your skin and the bag.  · Leave the ice on for 15-20 minutes, 3-4 times a day for the first 2 to 3 days. After that, ice and heat may be alternated to reduce pain and spasms.  · Ask your caregiver about trying back exercises and gentle massage. This may be of some benefit.  · Avoid feeling anxious or stressed. Stress increases muscle tension and can worsen back pain. It is important to recognize when you are anxious or stressed and learn ways to manage it. Exercise is a great option.  SEEK MEDICAL CARE IF:  · You have pain that is not relieved with rest or   medicine.  · You have pain that does not improve in 1 week.  · You have new symptoms.  · You are generally not feeling well.  SEEK IMMEDIATE MEDICAL CARE IF:   · You have pain that radiates from your back into your legs.  · You develop new bowel or bladder control problems.  · You have unusual weakness or numbness in your arms or legs.  · You develop nausea or vomiting.  · You develop abdominal pain.  · You feel faint.  Document Released: 08/27/2005 Document Revised: 02/26/2012 Document Reviewed: 01/15/2011  Exit[employer]® Patient Information ©2014 [employer].

## 2013-05-21 NOTE — Progress Notes (Signed)
  Subjective:    Patient ID: Kenneth [name], male    DOB: 1966/05/18, 47 y.o.   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 Pain meds help.    Review of Systems  All other systems reviewed and are negative.       Objective:   Physical Exam  Constitutional: He appears well-developed and well-nourished.  Cardiovascular: Normal rate, regular rhythm and normal heart sounds.   Pulmonary/Chest: Effort normal and breath sounds normal.  Musculoskeletal:  Decreased ROM of lumbar spine with pain on flexion and extension-  Motor strength and sensation distally intact (+) SLR at 45 degrees on right (-) SLR left    BP 113/76  Pulse 83  Temp(Src) 98.3 F (36.8 C) (Oral)  Ht 5\' 11"  (1.803 m)  Wt 172 lb (78.019 kg)  BMI 24 kg/m2       Assessment & Plan:  1. Low back pain Moist heat Rest No lifting ,bending or stooping RTO prn - methylPREDNISolone acetate (DEPO-MEDROL) injection 80 mg; Inject 1 mL (80 mg total) into the muscle once. - predniSONE (DELTASONE) 20 MG tablet; 2 po at sametime daily for 5 days- start Friday  Dispense: 10 tablet; Refill: 0 - oxyCODONE-acetaminophen (PERCOCET) 10-325 MG per tablet; Take 1 tablet by mouth every 8 (eight) hours as needed for pain.  Dispense: 60 tablet; Refill: 0  Mary-Margaret Daphine Deutscher, FNP

## 2013-05-27 ENCOUNTER — Other Ambulatory Visit: Payer: Self-pay | Admitting: Nurse Practitioner

## 2013-05-27 MED ORDER — ALPRAZOLAM 0.5 MG PO TABS: 0.5000 mg | ORAL_TABLET | Freq: Every evening | ORAL | Status: DC | PRN

## 2013-05-27 NOTE — Progress Notes (Signed)
Called in.

## 2013-06-02 ENCOUNTER — Encounter: Payer: Self-pay | Admitting: Nurse Practitioner

## 2013-06-02 ENCOUNTER — Other Ambulatory Visit: Payer: Self-pay | Admitting: Nurse Practitioner

## 2013-06-02 MED ORDER — GABAPENTIN 600 MG PO TABS: 600.0000 mg | ORAL_TABLET | Freq: Three times a day (TID) | ORAL | Status: DC

## 2013-06-09 ENCOUNTER — Encounter: Payer: Self-pay | Admitting: Nurse Practitioner

## 2013-06-12 ENCOUNTER — Other Ambulatory Visit: Payer: Self-pay | Admitting: Nurse Practitioner

## 2013-07-06 ENCOUNTER — Other Ambulatory Visit: Payer: Self-pay | Admitting: Nurse Practitioner

## 2013-07-08 NOTE — Telephone Encounter (Signed)
LAST LIPIDS 01/26/13. LAST OV 05/21/13.

## 2013-08-11 ENCOUNTER — Other Ambulatory Visit: Payer: Self-pay | Admitting: Nurse Practitioner

## 2013-09-09 ENCOUNTER — Other Ambulatory Visit: Payer: Self-pay | Admitting: Nurse Practitioner

## 2013-09-14 NOTE — Telephone Encounter (Signed)
NTBS- please call in xanax rx 

## 2013-09-14 NOTE — Telephone Encounter (Signed)
Last seen 05/21/13  Last lipid 01/26/13  MMM  If approved route to nurse to call into [facility]

## 2013-09-14 NOTE — Telephone Encounter (Signed)
rx called into pharmacy

## 2013-10-13 ENCOUNTER — Other Ambulatory Visit: Payer: Self-pay | Admitting: Nurse Practitioner

## 2013-10-14 NOTE — Telephone Encounter (Signed)
Patient last seen in office on 05-21-13 and rx last filled then from [place] as well. Please advise. If approved please print and route to Pool B so nurse can call patient to pick up

## 2013-10-14 NOTE — Telephone Encounter (Signed)
Patient aware rx up front to pick up 

## 2013-10-14 NOTE — Telephone Encounter (Signed)
rx ready for pickup 

## 2013-10-26 ENCOUNTER — Other Ambulatory Visit: Payer: Self-pay | Admitting: Nurse Practitioner

## 2013-10-29 NOTE — Telephone Encounter (Signed)
Called in.

## 2013-10-29 NOTE — Telephone Encounter (Signed)
Last seen 05/21/13  MMM Last lipid 5/14

## 2013-10-29 NOTE — Telephone Encounter (Signed)
Please call in xanax with 1 refills 

## 2013-12-07 ENCOUNTER — Other Ambulatory Visit: Payer: Self-pay | Admitting: Nurse Practitioner

## 2013-12-08 NOTE — Telephone Encounter (Signed)
Last ov 9/14 

## 2013-12-15 ENCOUNTER — Other Ambulatory Visit: Payer: Self-pay | Admitting: Nurse Practitioner

## 2013-12-16 NOTE — Telephone Encounter (Signed)
Patient NTBS for follow up and lab work Please call in [place] with 0 refills

## 2013-12-16 NOTE — Telephone Encounter (Signed)
Last ov 9/14. Last lipomed 5/14. Ntbs. Last refill on ambien 10/13/13 and last refill on xanax 10/29/13. If approved call in xanax and [place] to [facility].

## 2013-12-17 ENCOUNTER — Telehealth: Payer: Self-pay

## 2013-12-17 NOTE — Telephone Encounter (Signed)
REFILLS CALLED

## 2013-12-17 NOTE — Telephone Encounter (Signed)
rx for alprazolam and zolpoidem called to [facility] rx per mmm request

## 2013-12-29 ENCOUNTER — Other Ambulatory Visit: Payer: Self-pay | Admitting: Nurse Practitioner

## 2013-12-31 NOTE — Telephone Encounter (Signed)
Patient NTBS for follow up and lab work  

## 2013-12-31 NOTE — Telephone Encounter (Signed)
Last seen 05/21/13  MMM  Requesting 90 day supply

## 2014-01-13 ENCOUNTER — Other Ambulatory Visit: Payer: Self-pay | Admitting: Nurse Practitioner

## 2014-01-15 NOTE — Telephone Encounter (Signed)
rx ready for pick up Patient NTBS for follow up and lab work  

## 2014-01-15 NOTE — Telephone Encounter (Signed)
Last seen 05/2013 

## 2014-01-15 NOTE — Telephone Encounter (Signed)
Patient aware up front  

## 2014-01-25 ENCOUNTER — Other Ambulatory Visit: Payer: Self-pay | Admitting: Nurse Practitioner

## 2014-01-26 ENCOUNTER — Encounter: Payer: Self-pay | Admitting: Nurse Practitioner

## 2014-01-26 NOTE — Telephone Encounter (Signed)
Patient aware and will call to schedule

## 2014-01-26 NOTE — Telephone Encounter (Signed)
Patient NTBS for follow up and lab work  

## 2014-01-26 NOTE — Telephone Encounter (Signed)
Last seen 05/2013 

## 2014-01-28 ENCOUNTER — Encounter: Payer: Self-pay | Admitting: Nurse Practitioner

## 2014-01-28 ENCOUNTER — Ambulatory Visit (INDEPENDENT_AMBULATORY_CARE_PROVIDER_SITE_OTHER): Payer: BC Managed Care – PPO | Admitting: Nurse Practitioner

## 2014-01-28 VITALS — BP 127/84 | HR 85 | Temp 98.0°F | Ht 71.0 in | Wt 178.8 lb

## 2014-01-28 DIAGNOSIS — F32A Depression, unspecified: Secondary | ICD-10-CM

## 2014-01-28 DIAGNOSIS — F3289 Other specified depressive episodes: Secondary | ICD-10-CM

## 2014-01-28 DIAGNOSIS — F411 Generalized anxiety disorder: Secondary | ICD-10-CM | POA: Insufficient documentation

## 2014-01-28 DIAGNOSIS — M5030 Other cervical disc degeneration, unspecified cervical region: Secondary | ICD-10-CM

## 2014-01-28 DIAGNOSIS — E039 Hypothyroidism, unspecified: Secondary | ICD-10-CM

## 2014-01-28 DIAGNOSIS — G4700 Insomnia, unspecified: Secondary | ICD-10-CM

## 2014-01-28 DIAGNOSIS — Z125 Encounter for screening for malignant neoplasm of prostate: Secondary | ICD-10-CM

## 2014-01-28 DIAGNOSIS — F329 Major depressive disorder, single episode, unspecified: Secondary | ICD-10-CM

## 2014-01-28 DIAGNOSIS — E785 Hyperlipidemia, unspecified: Secondary | ICD-10-CM

## 2014-01-28 MED ORDER — ATORVASTATIN CALCIUM 40 MG PO TABS: ORAL_TABLET | ORAL | Status: DC

## 2014-01-28 MED ORDER — BUPROPION HCL ER (XL) 150 MG PO TB24: ORAL_TABLET | ORAL | Status: DC

## 2014-01-28 MED ORDER — FENOFIBRIC ACID 135 MG PO CPDR: DELAYED_RELEASE_CAPSULE | ORAL | Status: DC

## 2014-01-28 MED ORDER — GABAPENTIN 600 MG PO TABS: ORAL_TABLET | ORAL | Status: DC

## 2014-01-28 NOTE — Progress Notes (Signed)
Subjective:    Patient ID: Kenneth [name], male    DOB: 02-05-1966, 48 y.o.   MRN: [id number]  Patient here today for follow up of chronic medical problems.  Hyperlipidemia This is a chronic problem. The current episode started more than 1 year ago. The problem is controlled. Recent lipid tests were reviewed and are normal. Exacerbating diseases include hypothyroidism. There are no known factors aggravating his hyperlipidemia. Pertinent negatives include no chest pain, leg pain, myalgias or shortness of breath. Current antihyperlipidemic treatment includes fibric acid derivatives. The current treatment provides moderate improvement of lipids. Risk factors for coronary artery disease include hypertension and male sex.  Thyroid Problem Presents for follow-up visit. Patient reports no anxiety, diaphoresis, fatigue, hair loss, hoarse voice, leg swelling, nail problem, palpitations, weight gain or weight loss. The symptoms have been stable. His past medical history is significant for hyperlipidemia.  Hypertension This is a chronic problem. The current episode started more than 1 year ago. The problem is unchanged. The problem is controlled (had severla days at home where he was fatigued and blood pressure was in the 59'R systolic. Stopped blood pressure meds for a few days. Came up and now back on all meds now.). Pertinent negatives include no chest pain, palpitations, peripheral edema or shortness of breath. There are no associated agents to hypertension. Risk factors for coronary artery disease include dyslipidemia and male gender. Past treatments include ACE inhibitors. The current treatment provides moderate improvement. Hypertensive end-organ damage includes a thyroid problem.  depression celexa- working well. Able to tolerate day to day situations with out getting angry- Doesn't seem to be working as well as it use to. Patient says he has been having sexual side effects- patient wants to switch to  wellbutrin. Insomnia Sleeping okay on ambien. Usually feels rested in AM. DDD Only having nerve pain- Only takes percocet as needed- Use o be on gabapentin and wondered if will help if takes again.   Review of Systems  Constitutional: Negative for weight loss, weight gain, diaphoresis and fatigue.  HENT: Negative for hoarse voice.   Respiratory: Negative for shortness of breath.   Cardiovascular: Negative for chest pain and palpitations.  Musculoskeletal: Negative for myalgias.  All other systems reviewed and are negative.      Objective:   Physical Exam  Constitutional: He is oriented to person, place, and time. He appears well-developed and well-nourished.  HENT:  Head: Normocephalic.  Right Ear: External ear normal.  Left Ear: External ear normal.  Nose: Nose normal.  Mouth/Throat: Oropharynx is clear and moist.  Eyes: EOM are normal. Pupils are equal, round, and reactive to light.  Neck: Normal range of motion. Neck supple. No thyromegaly present.  Cardiovascular: Normal rate, regular rhythm, normal heart sounds and intact distal pulses.   No murmur heard. Pulmonary/Chest: Effort normal and breath sounds normal. He has no wheezes. He has no rales.  Abdominal: Soft. Bowel sounds are normal.  Musculoskeletal: Normal range of motion.  Neurological: He is alert and oriented to person, place, and time.  Skin: Skin is warm and dry.  Psychiatric: He has a normal mood and affect. His behavior is normal. Judgment and thought content normal.   BP 127/84  Pulse 85  Temp(Src) 98 F (36.7 C) (Oral)  Ht '5\' 11"'  (1.803 m)  Wt 178 lb 12.8 oz (81.103 kg)  BMI 24.95 kg/m2        Assessment & Plan:   1. Hypothyroidism   2. Hyperlipidemia   3. GAD (  generalized anxiety disorder)   4. Depression   5. Insomnia   6. DDD (degenerative disc disease), cervical   7. Prostate cancer screening    Orders Placed This Encounter  Procedures  . CMP14+EGFR  . NMR, lipoprofile  . PSA,  total and free  . Thyroid Panel With TSH   Meds ordered this encounter  Medications  . atorvastatin (LIPITOR) 40 MG tablet    Sig: TAKE 1 TABLET ONCE A DAY    Dispense:  30 tablet    Refill:  0    Order Specific Question:  Supervising Provider    Answer:  Chipper Herb [[id number]]  . buPROPion (WELLBUTRIN XL) 150 MG 24 hr tablet    Sig: TAKE 1 TABLET ONCE A DAY    Dispense:  30 tablet    Refill:  5    Order Specific Question:  Supervising Provider    Answer:  Chipper Herb [[id number]]  . Choline Fenofibrate (FENOFIBRIC ACID) 135 MG CPDR    Sig: TAKE (1) CAPSULE DAILY    Dispense:  30 capsule    Refill:  5    Order Specific Question:  Supervising Provider    Answer:  Chipper Herb [[id number]]  . gabapentin (NEURONTIN) 600 MG tablet    Sig: TAKE (1) TABLET THREE TIMES DAILY.    Dispense:  90 tablet    Refill:  5    Order Specific Question:  Supervising Provider    Answer:  Chipper Herb [[id number]]    Labs pending Health maintenance reviewed Diet and exercise encouraged Continue all meds Follow up  In 3 months   [place], FNP

## 2014-01-28 NOTE — Patient Instructions (Signed)

## 2014-01-29 ENCOUNTER — Other Ambulatory Visit: Payer: Self-pay | Admitting: Nurse Practitioner

## 2014-01-29 LAB — CMP14+EGFR
A/G RATIO: 2.1 (ref 1.1–2.5)
ALBUMIN: 4.5 g/dL (ref 3.5–5.5)
ALT: 87 IU/L — ABNORMAL HIGH (ref 0–44)
AST: 41 IU/L — ABNORMAL HIGH (ref 0–40)
Alkaline Phosphatase: 119 IU/L — ABNORMAL HIGH (ref 39–117)
BUN / CREAT RATIO: 10 (ref 9–20)
BUN: 12 mg/dL (ref 6–24)
CALCIUM: 9.3 mg/dL (ref 8.7–10.2)
CO2: 23 mmol/L (ref 18–29)
Chloride: 102 mmol/L (ref 97–108)
Creatinine, Ser: 1.22 mg/dL (ref 0.76–1.27)
GFR calc Af Amer: 82 mL/min/{1.73_m2} (ref >59)
GFR, EST NON AFRICAN AMERICAN: 71 mL/min/{1.73_m2} (ref >59)
GLOBULIN, TOTAL: 2.1 g/dL (ref 1.5–4.5)
Glucose: 107 mg/dL — ABNORMAL HIGH (ref 65–99)
Potassium: 4.1 mmol/L (ref 3.5–5.2)
SODIUM: 143 mmol/L (ref 134–144)
Total Bilirubin: 1.3 mg/dL — ABNORMAL HIGH (ref 0.0–1.2)
Total Protein: 6.6 g/dL (ref 6.0–8.5)

## 2014-01-29 LAB — THYROID PANEL WITH TSH
FREE THYROXINE INDEX: 3.4 (ref 1.2–4.9)
T3 UPTAKE RATIO: 29 % (ref 24–39)
T4 TOTAL: 11.7 ug/dL (ref 4.5–12.0)
TSH: 0.133 u[IU]/mL — AB (ref 0.450–4.500)

## 2014-01-29 LAB — NMR, LIPOPROFILE
CHOLESTEROL: 224 mg/dL — AB (ref 100–199)
HDL CHOLESTEROL BY NMR: 38 mg/dL — AB (ref >39)
HDL Particle Number: 31.3 umol/L (ref >=30.5)
LDL Particle Number: 1579 nmol/L — ABNORMAL HIGH (ref <1000)
LDL Size: 20 nm (ref >20.5)
LDLC SERPL CALC-MCNC: 133 mg/dL — AB (ref 0–99)
LP-IR Score: 74 — ABNORMAL HIGH (ref <=45)
Small LDL Particle Number: 1036 nmol/L — ABNORMAL HIGH (ref <=527)
Triglycerides by NMR: 265 mg/dL — ABNORMAL HIGH (ref 0–149)

## 2014-01-29 LAB — PSA, TOTAL AND FREE
PSA, Free Pct: 31.3 %
PSA, Free: 0.25 ng/mL
PSA: 0.8 ng/mL (ref 0.0–4.0)

## 2014-01-29 MED ORDER — LEVOTHYROXINE SODIUM 125 MCG PO TABS: 125.0000 ug | ORAL_TABLET | Freq: Every day | ORAL | Status: DC

## 2014-03-10 ENCOUNTER — Encounter: Payer: Self-pay | Admitting: Nurse Practitioner

## 2014-03-11 ENCOUNTER — Other Ambulatory Visit: Payer: Self-pay | Admitting: Nurse Practitioner

## 2014-03-11 MED ORDER — HYDROCORTISONE 2.5 % RE CREA: 1.0000 "application " | TOPICAL_CREAM | Freq: Two times a day (BID) | RECTAL | Status: DC

## 2014-03-29 ENCOUNTER — Other Ambulatory Visit: Payer: Self-pay | Admitting: Nurse Practitioner

## 2014-03-29 ENCOUNTER — Encounter: Payer: Self-pay | Admitting: Nurse Practitioner

## 2014-03-29 MED ORDER — HYDROCORTISONE 2.5 % RE CREA: 1.0000 "application " | TOPICAL_CREAM | Freq: Two times a day (BID) | RECTAL | Status: DC

## 2014-04-02 ENCOUNTER — Other Ambulatory Visit: Payer: Self-pay | Admitting: Nurse Practitioner

## 2014-04-07 ENCOUNTER — Other Ambulatory Visit: Payer: Self-pay | Admitting: Nurse Practitioner

## 2014-04-08 NOTE — Telephone Encounter (Signed)
Patient last seen in office on 01-28-14. Rx last filled on 01-18-14 for#60. Please advise. If approved please print and route to Pool B so nurse can call patient to pick up

## 2014-04-09 NOTE — Telephone Encounter (Signed)
rx ready for pickup 

## 2014-04-20 ENCOUNTER — Other Ambulatory Visit: Payer: Self-pay | Admitting: Nurse Practitioner

## 2014-04-21 NOTE — Telephone Encounter (Signed)
Patient last seen in office on 01-28-14. Rx last filled on 03-09-14 for #30. Please advise. If approved please route to Pool B so nurse can phone in to pharmacy

## 2014-04-21 NOTE — Telephone Encounter (Signed)
Called into pharmacy

## 2014-04-21 NOTE — Telephone Encounter (Signed)
Please call in ambien  with 0 refills 

## 2014-05-06 ENCOUNTER — Encounter: Payer: Self-pay | Admitting: Nurse Practitioner

## 2014-05-06 ENCOUNTER — Ambulatory Visit (INDEPENDENT_AMBULATORY_CARE_PROVIDER_SITE_OTHER): Payer: BC Managed Care – PPO | Admitting: Nurse Practitioner

## 2014-05-06 VITALS — BP 107/71 | HR 73 | Temp 97.6°F | Ht 71.0 in | Wt 165.0 lb

## 2014-05-06 DIAGNOSIS — F3289 Other specified depressive episodes: Secondary | ICD-10-CM

## 2014-05-06 DIAGNOSIS — F329 Major depressive disorder, single episode, unspecified: Secondary | ICD-10-CM

## 2014-05-06 DIAGNOSIS — E034 Atrophy of thyroid (acquired): Secondary | ICD-10-CM

## 2014-05-06 DIAGNOSIS — E785 Hyperlipidemia, unspecified: Secondary | ICD-10-CM

## 2014-05-06 DIAGNOSIS — E038 Other specified hypothyroidism: Secondary | ICD-10-CM

## 2014-05-06 DIAGNOSIS — G4700 Insomnia, unspecified: Secondary | ICD-10-CM

## 2014-05-06 DIAGNOSIS — M5030 Other cervical disc degeneration, unspecified cervical region: Secondary | ICD-10-CM

## 2014-05-06 DIAGNOSIS — I10 Essential (primary) hypertension: Secondary | ICD-10-CM

## 2014-05-06 DIAGNOSIS — F32A Depression, unspecified: Secondary | ICD-10-CM

## 2014-05-06 DIAGNOSIS — E0789 Other specified disorders of thyroid: Secondary | ICD-10-CM

## 2014-05-06 DIAGNOSIS — F411 Generalized anxiety disorder: Secondary | ICD-10-CM

## 2014-05-06 MED ORDER — FENTANYL 25 MCG/HR TD PT72: 25.0000 ug | MEDICATED_PATCH | TRANSDERMAL | Status: DC

## 2014-05-06 NOTE — Progress Notes (Signed)
Subjective:    Patient ID: Kenneth [name], male    DOB: 08-09-1966, 48 y.o.   MRN: [id number]  Patient here today for follow up of chronic medical problems.  Hyperlipidemia This is a chronic problem. The current episode started more than 1 year ago. The problem is controlled. Recent lipid tests were reviewed and are normal. Exacerbating diseases include hypothyroidism. There are no known factors aggravating his hyperlipidemia. Pertinent negatives include no chest pain, leg pain, myalgias or shortness of breath. Current antihyperlipidemic treatment includes fibric acid derivatives. The current treatment provides moderate improvement of lipids. Risk factors for coronary artery disease include hypertension and male sex.  Thyroid Problem Presents for follow-up visit. Patient reports no anxiety, diaphoresis, fatigue, hair loss, hoarse voice, leg swelling, nail problem, palpitations, weight gain or weight loss. The symptoms have been stable. His past medical history is significant for hyperlipidemia.  Hypertension This is a chronic problem. The current episode started more than 1 year ago. The problem is unchanged. The problem is controlled (had severla days at home where he was fatigued and blood pressure was in the 68'L systolic. Stopped blood pressure meds for a few days. Came up and now back on all meds now.). Pertinent negatives include no chest pain, palpitations, peripheral edema or shortness of breath. There are no associated agents to hypertension. Risk factors for coronary artery disease include dyslipidemia and male gender. Past treatments include ACE inhibitors. The current treatment provides moderate improvement. Hypertensive end-organ damage includes a thyroid problem.  depression celexa- working well. Able to tolerate day to day situations with out getting angry- Doesn't seem to be working as well as it use to. Patient says he has been having sexual side effects- patient wants to switch to  wellbutrin. Insomnia Sleeping okay on ambien. Usually feels rested in AM. DDD Only having nerve pain- Only takes percocet as needed- IS  on gabapentin which helps a little No real change on his back pain. Some days are worse then others. He does have constipation and insomnia from pain meds. ( has had 2 back surgeries and they want him to have another one)   Review of Systems  Constitutional: Negative for weight loss, weight gain, diaphoresis and fatigue.  HENT: Negative for hoarse voice.   Respiratory: Negative for shortness of breath.   Cardiovascular: Negative for chest pain and palpitations.  Musculoskeletal: Negative for myalgias.  All other systems reviewed and are negative.      Objective:   Physical Exam  Constitutional: He is oriented to person, place, and time. He appears well-developed and well-nourished.  HENT:  Head: Normocephalic.  Right Ear: External ear normal.  Left Ear: External ear normal.  Nose: Nose normal.  Mouth/Throat: Oropharynx is clear and moist.  Eyes: EOM are normal. Pupils are equal, round, and reactive to light.  Neck: Normal range of motion. Neck supple. No thyromegaly present.  Cardiovascular: Normal rate, regular rhythm, normal heart sounds and intact distal pulses.   No murmur heard. Pulmonary/Chest: Effort normal and breath sounds normal. He has no wheezes. He has no rales.  Abdominal: Soft. Bowel sounds are normal.  Musculoskeletal: Normal range of motion.  Neurological: He is alert and oriented to person, place, and time.  Skin: Skin is warm and dry.  Psychiatric: He has a normal mood and affect. His behavior is normal. Judgment and thought content normal.   BP 107/71  Pulse 73  Temp(Src) 97.6 F (36.4 C) (Oral)  Ht '5\' 11"'  (1.803 m)  Wt 165  lb (74.844 kg)  BMI 23.02 kg/m2        Assessment & Plan:   1. Insomnia   2. Hypothyroidism due to acquired atrophy of thyroid   3. Hyperlipidemia   4. GAD (generalized anxiety disorder)    5. Depression   6. DDD (degenerative disc disease), cervical    Orders Placed This Encounter  Procedures  . CMP14+EGFR  . NMR, lipoprofile  . Thyroid Panel With TSH     Meds ordered this encounter  Medications  . fentaNYL (DURAGESIC) 25 MCG/HR patch    Sig: Place 1 patch (25 mcg total) onto the skin every 3 (three) days.    Dispense:  10 patch    Refill:  0    Order Specific Question:  Supervising Provider    Answer:  Chipper Herb [[id number]]   Pain contract signed Labs pending Health maintenance reviewed Diet and exercise encouraged Continue all meds Follow up  In 3 months   [place], FNP

## 2014-05-06 NOTE — Patient Instructions (Signed)

## 2014-05-07 LAB — CMP14+EGFR
ALK PHOS: 63 IU/L (ref 39–117)
ALT: 29 IU/L (ref 0–44)
AST: 22 IU/L (ref 0–40)
Albumin/Globulin Ratio: 2 (ref 1.1–2.5)
Albumin: 4.5 g/dL (ref 3.5–5.5)
BILIRUBIN TOTAL: 0.8 mg/dL (ref 0.0–1.2)
BUN / CREAT RATIO: 11 (ref 9–20)
BUN: 14 mg/dL (ref 6–24)
CALCIUM: 9.2 mg/dL (ref 8.7–10.2)
CO2: 25 mmol/L (ref 18–29)
CREATININE: 1.26 mg/dL (ref 0.76–1.27)
Chloride: 99 mmol/L (ref 97–108)
GFR, EST AFRICAN AMERICAN: 78 mL/min/{1.73_m2} (ref >59)
GFR, EST NON AFRICAN AMERICAN: 67 mL/min/{1.73_m2} (ref >59)
GLOBULIN, TOTAL: 2.3 g/dL (ref 1.5–4.5)
Glucose: 93 mg/dL (ref 65–99)
POTASSIUM: 4.8 mmol/L (ref 3.5–5.2)
Sodium: 139 mmol/L (ref 134–144)
Total Protein: 6.8 g/dL (ref 6.0–8.5)

## 2014-05-07 LAB — NMR, LIPOPROFILE
Cholesterol: 171 mg/dL (ref 100–199)
HDL CHOLESTEROL BY NMR: 41 mg/dL (ref >39)
HDL PARTICLE NUMBER: 28.6 umol/L — AB (ref >=30.5)
LDL Particle Number: 1363 nmol/L — ABNORMAL HIGH (ref <1000)
LDL SIZE: 20.4 nm (ref >20.5)
LDLC SERPL CALC-MCNC: 111 mg/dL — ABNORMAL HIGH (ref 0–99)
LP-IR Score: 69 — ABNORMAL HIGH (ref <=45)
SMALL LDL PARTICLE NUMBER: 731 nmol/L — AB (ref <=527)
Triglycerides by NMR: 93 mg/dL (ref 0–149)

## 2014-05-07 LAB — THYROID PANEL WITH TSH
Free Thyroxine Index: 2.9 (ref 1.2–4.9)
T3 Uptake Ratio: 27 % (ref 24–39)
T4 TOTAL: 10.6 ug/dL (ref 4.5–12.0)
TSH: 2.77 u[IU]/mL (ref 0.450–4.500)

## 2014-05-10 ENCOUNTER — Other Ambulatory Visit: Payer: Self-pay | Admitting: Nurse Practitioner

## 2014-06-03 ENCOUNTER — Encounter: Payer: Self-pay | Admitting: Nurse Practitioner

## 2014-06-03 ENCOUNTER — Other Ambulatory Visit: Payer: Self-pay | Admitting: Nurse Practitioner

## 2014-06-03 MED ORDER — FENTANYL 25 MCG/HR TD PT72: 25.0000 ug | MEDICATED_PATCH | TRANSDERMAL | Status: DC

## 2014-06-09 ENCOUNTER — Other Ambulatory Visit: Payer: Self-pay | Admitting: Nurse Practitioner

## 2014-06-10 NOTE — Telephone Encounter (Signed)
Patient last seen in office on 05-06-14. Ambien last filled on 04-21-14 for #30. Xanax last filled on 10-29-13 for #60. Please advise. If approved please route to pool B so nurse can phone in to pharmacy

## 2014-06-13 NOTE — Telephone Encounter (Signed)
Please call in anbien and xanax with 0 refills

## 2014-06-14 NOTE — Telephone Encounter (Signed)
Called into pharmacy

## 2014-06-28 ENCOUNTER — Other Ambulatory Visit: Payer: Self-pay | Admitting: Nurse Practitioner

## 2014-06-28 ENCOUNTER — Encounter: Payer: Self-pay | Admitting: Nurse Practitioner

## 2014-06-28 MED ORDER — FENTANYL 12 MCG/HR TD PT72
12.5000 ug | MEDICATED_PATCH | TRANSDERMAL | Status: DC
Start: 2014-06-28 — End: 2014-07-22

## 2014-07-22 ENCOUNTER — Other Ambulatory Visit: Payer: Self-pay | Admitting: Nurse Practitioner

## 2014-07-23 NOTE — Telephone Encounter (Signed)
Last written 06/28/14 for 10 patches please advise and route pack to pool b

## 2014-07-26 ENCOUNTER — Other Ambulatory Visit: Payer: Self-pay | Admitting: Nurse Practitioner

## 2014-07-26 MED ORDER — FENTANYL 12 MCG/HR TD PT72: MEDICATED_PATCH | TRANSDERMAL | Status: DC

## 2014-07-26 NOTE — Telephone Encounter (Signed)
[facility] rx ready for pick up

## 2014-07-26 NOTE — Telephone Encounter (Signed)
Aware ,script ready. 

## 2014-08-02 ENCOUNTER — Encounter: Payer: Self-pay | Admitting: Nurse Practitioner

## 2014-08-03 ENCOUNTER — Other Ambulatory Visit (INDEPENDENT_AMBULATORY_CARE_PROVIDER_SITE_OTHER): Payer: BC Managed Care – PPO

## 2014-08-03 DIAGNOSIS — E0590 Thyrotoxicosis, unspecified without thyrotoxic crisis or storm: Secondary | ICD-10-CM

## 2014-08-04 LAB — THYROID PANEL WITH TSH
Free Thyroxine Index: 3.2 (ref 1.2–4.9)
T3 Uptake Ratio: 29 % (ref 24–39)
T4 TOTAL: 11.1 ug/dL (ref 4.5–12.0)
TSH: 1.01 u[IU]/mL (ref 0.450–4.500)

## 2014-08-16 ENCOUNTER — Encounter: Payer: Self-pay | Admitting: Nurse Practitioner

## 2014-08-24 ENCOUNTER — Encounter: Payer: Self-pay | Admitting: Nurse Practitioner

## 2014-08-24 ENCOUNTER — Ambulatory Visit (INDEPENDENT_AMBULATORY_CARE_PROVIDER_SITE_OTHER): Payer: BC Managed Care – PPO | Admitting: Nurse Practitioner

## 2014-08-24 ENCOUNTER — Other Ambulatory Visit: Payer: Self-pay | Admitting: Nurse Practitioner

## 2014-08-24 ENCOUNTER — Other Ambulatory Visit: Payer: Self-pay | Admitting: *Deleted

## 2014-08-24 VITALS — BP 131/88 | HR 80 | Temp 97.3°F | Ht 71.0 in | Wt 160.0 lb

## 2014-08-24 DIAGNOSIS — M5136 Other intervertebral disc degeneration, lumbar region: Secondary | ICD-10-CM

## 2014-08-24 DIAGNOSIS — F411 Generalized anxiety disorder: Secondary | ICD-10-CM

## 2014-08-24 DIAGNOSIS — F32A Depression, unspecified: Secondary | ICD-10-CM

## 2014-08-24 DIAGNOSIS — F329 Major depressive disorder, single episode, unspecified: Secondary | ICD-10-CM

## 2014-08-24 MED ORDER — OXYCODONE-ACETAMINOPHEN 10-325 MG PO TABS: ORAL_TABLET | ORAL | Status: DC

## 2014-08-24 MED ORDER — ESCITALOPRAM OXALATE 20 MG PO TABS: 20.0000 mg | ORAL_TABLET | Freq: Every day | ORAL | Status: DC

## 2014-08-24 MED ORDER — ALPRAZOLAM 0.5 MG PO TABS: ORAL_TABLET | ORAL | Status: DC

## 2014-08-24 NOTE — Telephone Encounter (Signed)
Please call in ambien with 1 refills 

## 2014-08-24 NOTE — Progress Notes (Signed)
   Subjective:    Patient ID: Kenneth [name], male    DOB: Nov 06, 1965, 48 y.o.   MRN: [id number]  HPI Patient in today to discuss thyroid levels- they were drawn several weeks ago and TSH was 1.010. He says that he is really here to discuss his anxiety level. He says that he is very anxious and depressed- Says he doesn't want to get out of bed, go to work and basically not do anything. He thinks that this started when we switched him to the fentanyl patch. He is taking hs wellbutrin daily and has been taking xanax more then he should over the last several weeks- use to only tae prn but is needing it daily now.    Review of Systems  Constitutional: Negative.   HENT: Negative.   Respiratory: Negative.   Cardiovascular: Negative.   Gastrointestinal: Negative.   Genitourinary: Negative.   Musculoskeletal: Positive for back pain (chronic).  Neurological: Negative.   Psychiatric/Behavioral: Positive for dysphoric mood. Negative for suicidal ideas. The patient is nervous/anxious.        Objective:   Physical Exam  Constitutional: He is oriented to person, place, and time. He appears well-developed and well-nourished.  Cardiovascular: Normal rate, regular rhythm and normal heart sounds.   Pulmonary/Chest: Effort normal and breath sounds normal.  Neurological: He is alert and oriented to person, place, and time.  Skin: Skin is warm and dry.  Psychiatric:  Good eye contact Answers all questions appropriately    BP 131/88 mmHg  Pulse 80  Temp(Src) 97.3 F (36.3 C) (Oral)  Ht 5\' 11"  (1.803 m)  Wt 160 lb (72.576 kg)  BMI 22.33 kg/m2       Assessment & Plan:  1. DDD (degenerative disc disease), lumbar Stop fentanyl patch - oxyCODONE-acetaminophen (PERCOCET) 10-325 MG per tablet; TAKE  (1)  TABLET  EVERY EIGHT HOURS AS NEEDED FOR PAIN.    - MAY MAKE DROWSY -  Dispense: 60 tablet; Refill: 0  2. Depression Stop wellbutrin - escitalopram (LEXAPRO) 20 MG tablet; Take 1 tablet (20 mg  total) by mouth daily.  Dispense: 30 tablet; Refill: 3  3. GAD (generalized anxiety disorder) Stress management - ALPRAZolam (XANAX) 0.5 MG tablet; TAKE  (1)  TABLET TWICE A DAY AS NEEDED.  Dispense: 60 tablet; Refill: 0   Mary-Margaret Hassell Done, FNP

## 2014-08-24 NOTE — Patient Instructions (Signed)
Stress and Stress Management Stress is a normal reaction to life events. It is what you feel when life demands more than you are used to or more than you can handle. Some stress can be useful. For example, the stress reaction can help you catch the last bus of the day, study for a test, or meet a deadline at work. But stress that occurs too often or for too long can cause problems. It can affect your emotional health and interfere with relationships and normal daily activities. Too much stress can weaken your immune system and increase your risk for physical illness. If you already have a medical problem, stress can make it worse. CAUSES  All sorts of life events may cause stress. An event that causes stress for one person may not be stressful for another person. Major life events commonly cause stress. These may be positive or negative. Examples include losing your job, moving into a new home, getting married, having a baby, or losing a loved one. Less obvious life events may also cause stress, especially if they occur day after day or in combination. Examples include working long hours, driving in traffic, caring for children, being in debt, or being in a difficult relationship. SIGNS AND SYMPTOMS Stress may cause emotional symptoms including, the following:  Anxiety. This is feeling worried, afraid, on edge, overwhelmed, or out of control.  Anger. This is feeling irritated or impatient.  Depression. This is feeling sad, down, helpless, or guilty.  Difficulty focusing, remembering, or making decisions. Stress may cause physical symptoms, including the following:   Aches and pains. These may affect your head, neck, back, stomach, or other areas of your body.  Tight muscles or clenched jaw.  Low energy or trouble sleeping. Stress may cause unhealthy behaviors, including the following:   Eating to feel better (overeating) or skipping meals.  Sleeping too little, too much, or both.  Working  too much or putting off tasks (procrastination).  Smoking, drinking alcohol, or using drugs to feel better. DIAGNOSIS  Stress is diagnosed through an assessment by your health care provider. Your health care provider will ask questions about your symptoms and any stressful life events.Your health care provider will also ask about your medical history and may order blood tests or other tests. Certain medical conditions and medicine can cause physical symptoms similar to stress. Mental illness can cause emotional symptoms and unhealthy behaviors similar to stress. Your health care provider may refer you to a mental health professional for further evaluation.  TREATMENT  Stress management is the recommended treatment for stress.The goals of stress management are reducing stressful life events and coping with stress in healthy ways.  Techniques for reducing stressful life events include the following:  Stress identification. Self-monitor for stress and identify what causes stress for you. These skills may help you to avoid some stressful events.  Time management. Set your priorities, keep a calendar of events, and learn to say "no." These tools can help you avoid making too many commitments. Techniques for coping with stress include the following:  Rethinking the problem. Try to think realistically about stressful events rather than ignoring them or overreacting. Try to find the positives in a stressful situation rather than focusing on the negatives.  Exercise. Physical exercise can release both physical and emotional tension. The key is to find a form of exercise you enjoy and do it regularly.  Relaxation techniques. These relax the body and mind. Examples include yoga, meditation, tai chi, biofeedback, deep  breathing, progressive muscle relaxation, listening to music, being out in nature, journaling, and other hobbies. Again, the key is to find one or more that you enjoy and can do  regularly.  Healthy lifestyle. Eat a balanced diet, get plenty of sleep, and do not smoke. Avoid using alcohol or drugs to relax.  Strong support network. Spend time with family, friends, or other people you enjoy being around.Express your feelings and talk things over with someone you trust. Counseling or talktherapy with a mental health professional may be helpful if you are having difficulty managing stress on your own. Medicine is typically not recommended for the treatment of stress.Talk to your health care provider if you think you need medicine for symptoms of stress. HOME CARE INSTRUCTIONS  Keep all follow-up visits as directed by your health care provider.  Take all medicines as directed by your health care provider. SEEK MEDICAL CARE IF:  Your symptoms get worse or you start having new symptoms.  You feel overwhelmed by your problems and can no longer manage them on your own. SEEK IMMEDIATE MEDICAL CARE IF:  You feel like hurting yourself or someone else. Document Released: 02/20/2001 Document Revised: 01/11/2014 Document Reviewed: 04/21/2013 Exit[employer] Patient Information 2015 [employer]. This information is not intended to replace advice given to you by your health care provider. Make sure you discuss any questions you have with your health care provider.  

## 2014-08-24 NOTE — Telephone Encounter (Signed)
Last seen 08/24/14 MMM If approved route to nurse to call into [facility] 

## 2014-08-24 NOTE — Telephone Encounter (Signed)
Refill called in. 

## 2014-10-05 ENCOUNTER — Other Ambulatory Visit: Payer: Self-pay | Admitting: Nurse Practitioner

## 2014-10-06 NOTE — Telephone Encounter (Signed)
Percocet rx ready for pick up.

## 2014-10-06 NOTE — Telephone Encounter (Signed)
Last seen 08/24/14  MMM If approved print and route to nurse

## 2014-10-07 NOTE — Telephone Encounter (Signed)
Patient notified that rx up front ready for pick up

## 2014-11-18 ENCOUNTER — Other Ambulatory Visit: Payer: Self-pay | Admitting: Nurse Practitioner

## 2014-11-18 NOTE — Telephone Encounter (Signed)
Last filled 10/07/14, last seen 08/24/14.

## 2014-11-18 NOTE — Telephone Encounter (Signed)
Percocet rx ready for pick up no more refills without being seen

## 2014-11-19 NOTE — Telephone Encounter (Signed)
Patient notified that rx up front and ready to pick up. 

## 2014-12-10 ENCOUNTER — Other Ambulatory Visit: Payer: Self-pay | Admitting: Nurse Practitioner

## 2014-12-10 NOTE — Telephone Encounter (Signed)
Please call in ambien with 1 refills 

## 2014-12-10 NOTE — Telephone Encounter (Signed)
Last seen 08/24/14 MMM If approved route to nurse to call into [facility]

## 2014-12-11 NOTE — Telephone Encounter (Signed)
rx called into pharmacy

## 2014-12-22 ENCOUNTER — Other Ambulatory Visit: Payer: Self-pay | Admitting: Nurse Practitioner

## 2014-12-23 NOTE — Telephone Encounter (Signed)
Last seen 08/24/14 MMM  If approved route to nurse to call into [facility]

## 2014-12-23 NOTE — Telephone Encounter (Signed)
Please call in xanax with 1 refills 

## 2014-12-24 ENCOUNTER — Telehealth: Payer: Self-pay | Admitting: *Deleted

## 2014-12-24 NOTE — Telephone Encounter (Signed)
Script for xanax called in.

## 2015-01-03 ENCOUNTER — Other Ambulatory Visit: Payer: Self-pay | Admitting: Nurse Practitioner

## 2015-01-03 NOTE — Telephone Encounter (Signed)
rx ready for pickup 

## 2015-01-03 NOTE — Telephone Encounter (Signed)
Please advise on refill- patient last seen 08/24/14- no refill scheduled.

## 2015-01-04 NOTE — Telephone Encounter (Signed)
Pt aware written refill is at front desk ready to be picked up

## 2015-01-24 ENCOUNTER — Other Ambulatory Visit: Payer: Self-pay | Admitting: Nurse Practitioner

## 2015-02-16 ENCOUNTER — Other Ambulatory Visit: Payer: Self-pay | Admitting: Nurse Practitioner

## 2015-03-01 ENCOUNTER — Other Ambulatory Visit: Payer: Self-pay | Admitting: Nurse Practitioner

## 2015-03-02 ENCOUNTER — Other Ambulatory Visit: Payer: Self-pay | Admitting: *Deleted

## 2015-03-02 DIAGNOSIS — E785 Hyperlipidemia, unspecified: Secondary | ICD-10-CM

## 2015-03-02 DIAGNOSIS — E034 Atrophy of thyroid (acquired): Secondary | ICD-10-CM

## 2015-03-02 NOTE — Telephone Encounter (Signed)
rx ready for pickup 

## 2015-03-02 NOTE — Telephone Encounter (Signed)
Last seen 08/24/14 MMM if approved print

## 2015-03-04 ENCOUNTER — Other Ambulatory Visit (INDEPENDENT_AMBULATORY_CARE_PROVIDER_SITE_OTHER): Payer: BLUE CROSS/BLUE SHIELD

## 2015-03-04 DIAGNOSIS — E785 Hyperlipidemia, unspecified: Secondary | ICD-10-CM

## 2015-03-04 DIAGNOSIS — E034 Atrophy of thyroid (acquired): Secondary | ICD-10-CM

## 2015-03-04 NOTE — Progress Notes (Signed)
Lab only 

## 2015-03-05 LAB — THYROID PANEL WITH TSH
Free Thyroxine Index: 3.5 (ref 1.2–4.9)
T3 UPTAKE RATIO: 30 % (ref 24–39)
T4 TOTAL: 11.6 ug/dL (ref 4.5–12.0)
TSH: 1.68 u[IU]/mL (ref 0.450–4.500)

## 2015-03-05 LAB — LIPID PANEL
CHOLESTEROL TOTAL: 267 mg/dL — AB (ref 100–199)
Chol/HDL Ratio: 6.5 ratio units — ABNORMAL HIGH (ref 0.0–5.0)
HDL: 41 mg/dL (ref >39)
LDL Calculated: 158 mg/dL — ABNORMAL HIGH (ref 0–99)
TRIGLYCERIDES: 341 mg/dL — AB (ref 0–149)
VLDL Cholesterol Cal: 68 mg/dL — ABNORMAL HIGH (ref 5–40)

## 2015-03-05 LAB — CMP14+EGFR
A/G RATIO: 1.8 (ref 1.1–2.5)
ALBUMIN: 4.4 g/dL (ref 3.5–5.5)
ALT: 58 IU/L — AB (ref 0–44)
AST: 38 IU/L (ref 0–40)
Alkaline Phosphatase: 71 IU/L (ref 39–117)
BUN/Creatinine Ratio: 10 (ref 9–20)
BUN: 10 mg/dL (ref 6–24)
Bilirubin Total: 0.8 mg/dL (ref 0.0–1.2)
CHLORIDE: 99 mmol/L (ref 97–108)
CO2: 25 mmol/L (ref 18–29)
CREATININE: 1.01 mg/dL (ref 0.76–1.27)
Calcium: 9.1 mg/dL (ref 8.7–10.2)
GFR calc Af Amer: 102 mL/min/{1.73_m2} (ref >59)
GFR, EST NON AFRICAN AMERICAN: 88 mL/min/{1.73_m2} (ref >59)
Globulin, Total: 2.5 g/dL (ref 1.5–4.5)
Glucose: 85 mg/dL (ref 65–99)
Potassium: 4.3 mmol/L (ref 3.5–5.2)
Sodium: 140 mmol/L (ref 134–144)
TOTAL PROTEIN: 6.9 g/dL (ref 6.0–8.5)

## 2015-03-08 ENCOUNTER — Encounter: Payer: Self-pay | Admitting: Nurse Practitioner

## 2015-03-08 ENCOUNTER — Ambulatory Visit (INDEPENDENT_AMBULATORY_CARE_PROVIDER_SITE_OTHER): Payer: BLUE CROSS/BLUE SHIELD | Admitting: Nurse Practitioner

## 2015-03-08 VITALS — BP 122/84 | HR 79 | Temp 97.8°F | Ht 71.0 in | Wt 170.8 lb

## 2015-03-08 DIAGNOSIS — F411 Generalized anxiety disorder: Secondary | ICD-10-CM

## 2015-03-08 DIAGNOSIS — G4700 Insomnia, unspecified: Secondary | ICD-10-CM | POA: Diagnosis not present

## 2015-03-08 DIAGNOSIS — E038 Other specified hypothyroidism: Secondary | ICD-10-CM | POA: Diagnosis not present

## 2015-03-08 DIAGNOSIS — E034 Atrophy of thyroid (acquired): Secondary | ICD-10-CM | POA: Diagnosis not present

## 2015-03-08 DIAGNOSIS — F329 Major depressive disorder, single episode, unspecified: Secondary | ICD-10-CM | POA: Diagnosis not present

## 2015-03-08 DIAGNOSIS — F32A Depression, unspecified: Secondary | ICD-10-CM

## 2015-03-08 DIAGNOSIS — E785 Hyperlipidemia, unspecified: Secondary | ICD-10-CM

## 2015-03-08 DIAGNOSIS — I10 Essential (primary) hypertension: Secondary | ICD-10-CM | POA: Diagnosis not present

## 2015-03-08 DIAGNOSIS — M5136 Other intervertebral disc degeneration, lumbar region: Secondary | ICD-10-CM

## 2015-03-08 MED ORDER — FENOFIBRIC ACID 135 MG PO CPDR: DELAYED_RELEASE_CAPSULE | ORAL | Status: DC

## 2015-03-08 MED ORDER — LEVOTHYROXINE SODIUM 125 MCG PO TABS: 125.0000 ug | ORAL_TABLET | Freq: Every day | ORAL | Status: DC

## 2015-03-08 MED ORDER — ATORVASTATIN CALCIUM 40 MG PO TABS: ORAL_TABLET | ORAL | Status: DC

## 2015-03-08 MED ORDER — ALPRAZOLAM 1 MG PO TABS: 1.0000 mg | ORAL_TABLET | Freq: Two times a day (BID) | ORAL | Status: DC | PRN

## 2015-03-08 MED ORDER — LISINOPRIL-HYDROCHLOROTHIAZIDE 10-12.5 MG PO TABS: 1.0000 | ORAL_TABLET | Freq: Every day | ORAL | Status: DC

## 2015-03-08 NOTE — Patient Instructions (Signed)
Fat and Cholesterol Control Diet Fat and cholesterol levels in your blood and organs are influenced by your diet. High levels of fat and cholesterol may lead to diseases of the heart, small and large blood vessels, gallbladder, liver, and pancreas. CONTROLLING FAT AND CHOLESTEROL WITH DIET Although exercise and lifestyle factors are important, your diet is key. That is because certain foods are known to raise cholesterol and others to lower it. The goal is to balance foods for their effect on cholesterol and more importantly, to replace saturated and trans fat with other types of fat, such as monounsaturated fat, polyunsaturated fat, and omega-3 fatty acids. On average, a person should consume no more than 15 to 17 g of saturated fat daily. Saturated and trans fats are considered "bad" fats, and they will raise LDL cholesterol. Saturated fats are primarily found in animal products such as meats, butter, and cream. However, that does not mean you need to give up all your favorite foods. Today, there are good tasting, low-fat, low-cholesterol substitutes for most of the things you like to eat. Choose low-fat or nonfat alternatives. Choose round or loin cuts of red meat. These types of cuts are lowest in fat and cholesterol. Chicken (without the skin), fish, veal, and ground turkey breast are great choices. Eliminate fatty meats, such as hot dogs and salami. Even shellfish have little or no saturated fat. Have a 3 oz (85 g) portion when you eat lean meat, poultry, or fish. Trans fats are also called "partially hydrogenated oils." They are oils that have been scientifically manipulated so that they are solid at room temperature resulting in a longer shelf life and improved taste and texture of foods in which they are added. Trans fats are found in stick margarine, some tub margarines, cookies, crackers, and baked goods.  When baking and cooking, oils are a great substitute for butter. The monounsaturated oils are  especially beneficial since it is believed they lower LDL and raise HDL. The oils you should avoid entirely are saturated tropical oils, such as coconut and palm.  Remember to eat a lot from food groups that are naturally free of saturated and trans fat, including fish, fruit, vegetables, beans, grains (barley, rice, couscous, bulgur wheat), and pasta (without cream sauces).  IDENTIFYING FOODS THAT LOWER FAT AND CHOLESTEROL  Soluble fiber may lower your cholesterol. This type of fiber is found in fruits such as apples, vegetables such as broccoli, potatoes, and carrots, legumes such as beans, peas, and lentils, and grains such as barley. Foods fortified with plant sterols (phytosterol) may also lower cholesterol. You should eat at least 2 g per day of these foods for a cholesterol lowering effect.  Read package labels to identify low-saturated fats, trans fat free, and low-fat foods at the supermarket. Select cheeses that have only 2 to 3 g saturated fat per ounce. Use a heart-healthy tub margarine that is free of trans fats or partially hydrogenated oil. When buying baked goods (cookies, crackers), avoid partially hydrogenated oils. Breads and muffins should be made from whole grains (whole-wheat or whole oat flour, instead of "flour" or "enriched flour"). Buy non-creamy canned soups with reduced salt and no added fats.  FOOD PREPARATION TECHNIQUES  Never deep-fry. If you must fry, either stir-fry, which uses very little fat, or use non-stick cooking sprays. When possible, broil, bake, or roast meats, and steam vegetables. Instead of putting butter or margarine on vegetables, use lemon and herbs, applesauce, and cinnamon (for squash and sweet potatoes). Use nonfat   yogurt, salsa, and low-fat dressings for salads.  LOW-SATURATED FAT / LOW-FAT FOOD SUBSTITUTES Meats / Saturated Fat (g)  Avoid: Steak, marbled (3 oz/85 g) / 11 g  Choose: Steak, lean (3 oz/85 g) / 4 g  Avoid: Hamburger (3 oz/85 g) / 7  g  Choose: Hamburger, lean (3 oz/85 g) / 5 g  Avoid: Ham (3 oz/85 g) / 6 g  Choose: Ham, lean cut (3 oz/85 g) / 2.4 g  Avoid: Chicken, with skin, dark meat (3 oz/85 g) / 4 g  Choose: Chicken, skin removed, dark meat (3 oz/85 g) / 2 g  Avoid: Chicken, with skin, light meat (3 oz/85 g) / 2.5 g  Choose: Chicken, skin removed, light meat (3 oz/85 g) / 1 g Dairy / Saturated Fat (g)  Avoid: Whole milk (1 cup) / 5 g  Choose: Low-fat milk, 2% (1 cup) / 3 g  Choose: Low-fat milk, 1% (1 cup) / 1.5 g  Choose: Skim milk (1 cup) / 0.3 g  Avoid: Hard cheese (1 oz/28 g) / 6 g  Choose: Skim milk cheese (1 oz/28 g) / 2 to 3 g  Avoid: Cottage cheese, 4% fat (1 cup) / 6.5 g  Choose: Low-fat cottage cheese, 1% fat (1 cup) / 1.5 g  Avoid: Ice cream (1 cup) / 9 g  Choose: Sherbet (1 cup) / 2.5 g  Choose: Nonfat frozen yogurt (1 cup) / 0.3 g  Choose: Frozen fruit bar / trace  Avoid: Whipped cream (1 tbs) / 3.5 g  Choose: Nondairy whipped topping (1 tbs) / 1 g Condiments / Saturated Fat (g)  Avoid: Mayonnaise (1 tbs) / 2 g  Choose: Low-fat mayonnaise (1 tbs) / 1 g  Avoid: Butter (1 tbs) / 7 g  Choose: Extra light margarine (1 tbs) / 1 g  Avoid: Coconut oil (1 tbs) / 11.8 g  Choose: Olive oil (1 tbs) / 1.8 g  Choose: Corn oil (1 tbs) / 1.7 g  Choose: Safflower oil (1 tbs) / 1.2 g  Choose: Sunflower oil (1 tbs) / 1.4 g  Choose: Soybean oil (1 tbs) / 2.4 g  Choose: Canola oil (1 tbs) / 1 g Document Released: 08/27/2005 Document Revised: 12/22/2012 Document Reviewed: 11/25/2013 ExitCare Patient Information 2015 [employer]. This information is not intended to replace advice given to you by your health care provider. Make sure you discuss any questions you have with your health care provider.  

## 2015-03-08 NOTE — Addendum Note (Signed)
Addended by: Chevis Pretty on: 03/08/2015 04:33 PM   Modules accepted: Orders, Medications

## 2015-03-08 NOTE — Progress Notes (Signed)
Subjective:    Patient ID: Kenneth [name], male    DOB: 03/10/1966, 49 y.o.   MRN: [id number]  Patient here today for follow up of chronic medical problems. No change since previous visit.  Hyperlipidemia This is a chronic problem. The current episode started more than 1 year ago. The problem is controlled. Exacerbating diseases include hypothyroidism. He has no history of diabetes or obesity. Pertinent negatives include no chest pain, myalgias or shortness of breath. Current antihyperlipidemic treatment includes statins and fibric acid derivatives. The current treatment provides moderate improvement of lipids. Compliance problems include adherence to diet and adherence to exercise.  Risk factors for coronary artery disease include dyslipidemia, hypertension and male sex.  Thyroid Problem Presents for follow-up visit. Patient reports no diaphoresis, fatigue or palpitations. The symptoms have been stable. His past medical history is significant for hyperlipidemia. There is no history of diabetes.  Hypertension This is a chronic problem. The current episode started more than 1 year ago. The problem is unchanged. The problem is controlled. Pertinent negatives include no chest pain, palpitations or shortness of breath. Risk factors for coronary artery disease include dyslipidemia and male gender. Past treatments include ACE inhibitors and diuretics. The current treatment provides moderate improvement. Compliance problems include diet and exercise.  Hypertensive end-organ damage includes a thyroid problem. There is no history of CAD/MI or CVA.  depression celexa- working well. Able to tolerate day to day situations with out getting angry- Doesn't seem to be working as well as it use to. Patient says he has been having sexual side effects- patient wants to switch to wellbutrin. Insomnia Sleeping okay on ambien. Usually feels rested in AM. DDD Only having nerve pain- Only takes percocet as needed- IS  on  gabapentin which helps a little No real change on his back pain. Some days are worse then others. He does have constipation and insomnia from pain meds. ( has had 2 back surgeries and they want him to have another one) His pain going down his leg has gone away- patient wants ti stoppercocet and has not had one since last Thursday- pain is 4/10 which is tolerable- If back gets worse he may go ahead and have surgery. Has increased his xanax to stay of  Pain pills.  Review of Systems  Constitutional: Negative for diaphoresis and fatigue.  HENT: Negative.   Respiratory: Negative for shortness of breath.   Cardiovascular: Negative for chest pain and palpitations.  Genitourinary: Negative.   Musculoskeletal: Negative for myalgias.  Neurological: Negative.   Psychiatric/Behavioral: Negative.   All other systems reviewed and are negative.      Objective:   Physical Exam  Constitutional: He is oriented to person, place, and time. He appears well-developed and well-nourished.  HENT:  Head: Normocephalic.  Right Ear: External ear normal.  Left Ear: External ear normal.  Nose: Nose normal.  Mouth/Throat: Oropharynx is clear and moist.  Eyes: EOM are normal. Pupils are equal, round, and reactive to light.  Neck: Normal range of motion. Neck supple. No thyromegaly present.  Cardiovascular: Normal rate, regular rhythm, normal heart sounds and intact distal pulses.   No murmur heard. Pulmonary/Chest: Effort normal and breath sounds normal. He has no wheezes. He has no rales.  Abdominal: Soft. Bowel sounds are normal.  Musculoskeletal: Normal range of motion.  Neurological: He is alert and oriented to person, place, and time.  Skin: Skin is warm and dry.  Psychiatric: He has a normal mood and affect. His behavior is normal.  Judgment and thought content normal.     BP 122/84 mmHg  Pulse 79  Temp(Src) 97.8 F (36.6 C) (Oral)  Ht 5\' 11"  (1.803 m)  Wt 170 lb 12.8 oz (77.474 kg)  BMI 23.83  kg/m2       Assessment & Plan:   1. Hypothyroidism due to acquired atrophy of thyroid - levothyroxine (SYNTHROID, LEVOTHROID) 125 MCG tablet; Take 1 tablet (125 mcg total) by mouth daily.  Dispense: 90 tablet; Refill: 3  2. DDD (degenerative disc disease), lumbar  3. Insomnia Bedtime ritual  4. Hyperlipidemia Low fat diet - Choline Fenofibrate (FENOFIBRIC ACID) 135 MG CPDR; TAKE (1) CAPSULE DAILY  Dispense: 30 capsule; Refill: 5 - atorvastatin (LIPITOR) 40 MG tablet; TAKE 1 TABLET ONCE A DAY  Dispense: 30 tablet; Refill: 0  5. Depression Stress management  6. GAD (generalized anxiety disorder) - ALPRAZolam (XANAX) 1 MG tablet; Take 1 tablet (1 mg total) by mouth 2 (two) times daily as needed for anxiety.  Dispense: 60 tablet; Refill: 1  7. Essential hypertension Do not add salt to diet - lisinopril-hydrochlorothiazide (PRINZIDE,ZESTORETIC) 10-12.5 MG per tablet; Take 1 tablet by mouth daily.  Dispense: 90 tablet; Refill: 1    Labs pending Health maintenance reviewed Diet and exercise encouraged Continue all meds Follow up  In 3 months    [place], FNP

## 2015-03-16 ENCOUNTER — Other Ambulatory Visit: Payer: Self-pay | Admitting: Nurse Practitioner

## 2015-03-17 NOTE — Telephone Encounter (Signed)
Please call in ambien with 1 refills 

## 2015-03-17 NOTE — Telephone Encounter (Signed)
Refill called to pharmacy.

## 2015-03-17 NOTE — Telephone Encounter (Signed)
Last seen 03/08/15 MMM If approved route to nurse to call into [facility]

## 2015-03-21 ENCOUNTER — Other Ambulatory Visit: Payer: Self-pay | Admitting: Nurse Practitioner

## 2015-05-11 ENCOUNTER — Other Ambulatory Visit: Payer: Self-pay | Admitting: Nurse Practitioner

## 2015-05-11 NOTE — Telephone Encounter (Signed)
Please call in xanax with 1 refills 

## 2015-05-11 NOTE — Telephone Encounter (Signed)
Last filled 04/11/15, last seen 03/08/15. Call in at [facility]

## 2015-05-12 NOTE — Telephone Encounter (Signed)
rx called to pharmacy 

## 2015-07-04 ENCOUNTER — Other Ambulatory Visit: Payer: Self-pay | Admitting: Nurse Practitioner

## 2015-07-05 NOTE — Telephone Encounter (Signed)
Last seen 03/08/15 MMM If approved route to nurse to call into [facility] 

## 2015-07-07 NOTE — Telephone Encounter (Signed)
Please call in ambien with 1 refills 

## 2015-07-07 NOTE — Telephone Encounter (Signed)
Called into [facility].  

## 2015-07-13 ENCOUNTER — Other Ambulatory Visit: Payer: Self-pay | Admitting: Nurse Practitioner

## 2015-07-14 NOTE — Telephone Encounter (Signed)
Last seen 03/08/15 MMM If approved route to nurse to call into [facility] 

## 2015-07-15 NOTE — Telephone Encounter (Signed)
Called to pharmacy 

## 2015-07-15 NOTE — Telephone Encounter (Signed)
Please call in xanax with 1 refills 

## 2015-08-26 ENCOUNTER — Other Ambulatory Visit: Payer: Self-pay | Admitting: Family

## 2015-09-29 ENCOUNTER — Other Ambulatory Visit: Payer: Self-pay | Admitting: Nurse Practitioner

## 2015-09-29 NOTE — Telephone Encounter (Signed)
Last refill without being seen 

## 2015-09-29 NOTE — Telephone Encounter (Signed)
Last seen 03/08/15 MMM

## 2015-10-11 ENCOUNTER — Other Ambulatory Visit: Payer: Self-pay | Admitting: Nurse Practitioner

## 2015-10-11 NOTE — Telephone Encounter (Signed)
Last filled 08/15/15, last seen 03/08/15. Pt uses [facility]

## 2015-10-11 NOTE — Telephone Encounter (Signed)
Please call in xanax with 1 refills 

## 2015-10-12 NOTE — Telephone Encounter (Signed)
Refill called to [facility] 

## 2015-11-04 ENCOUNTER — Other Ambulatory Visit: Payer: Self-pay | Admitting: Nurse Practitioner

## 2015-11-04 NOTE — Telephone Encounter (Signed)
Last seen 03/08/15 MMM If approved route to nurse to call into [facility] 

## 2015-11-04 NOTE — Telephone Encounter (Signed)
Please call in ambien with 1 refills 

## 2015-11-04 NOTE — Telephone Encounter (Signed)
rx called into pharmacy

## 2015-11-23 ENCOUNTER — Other Ambulatory Visit: Payer: Self-pay | Admitting: Nurse Practitioner

## 2015-12-06 ENCOUNTER — Encounter: Payer: Self-pay | Admitting: Nurse Practitioner

## 2015-12-06 ENCOUNTER — Ambulatory Visit (INDEPENDENT_AMBULATORY_CARE_PROVIDER_SITE_OTHER): Payer: BLUE CROSS/BLUE SHIELD | Admitting: Nurse Practitioner

## 2015-12-06 VITALS — BP 122/87 | HR 85 | Temp 97.4°F | Ht 71.0 in | Wt 189.0 lb

## 2015-12-06 DIAGNOSIS — E785 Hyperlipidemia, unspecified: Secondary | ICD-10-CM | POA: Diagnosis not present

## 2015-12-06 DIAGNOSIS — E034 Atrophy of thyroid (acquired): Secondary | ICD-10-CM | POA: Diagnosis not present

## 2015-12-06 DIAGNOSIS — F329 Major depressive disorder, single episode, unspecified: Secondary | ICD-10-CM | POA: Diagnosis not present

## 2015-12-06 DIAGNOSIS — Z125 Encounter for screening for malignant neoplasm of prostate: Secondary | ICD-10-CM | POA: Diagnosis not present

## 2015-12-06 DIAGNOSIS — M5136 Other intervertebral disc degeneration, lumbar region: Secondary | ICD-10-CM | POA: Diagnosis not present

## 2015-12-06 DIAGNOSIS — G4700 Insomnia, unspecified: Secondary | ICD-10-CM

## 2015-12-06 DIAGNOSIS — E038 Other specified hypothyroidism: Secondary | ICD-10-CM | POA: Diagnosis not present

## 2015-12-06 DIAGNOSIS — I10 Essential (primary) hypertension: Secondary | ICD-10-CM | POA: Diagnosis not present

## 2015-12-06 DIAGNOSIS — F411 Generalized anxiety disorder: Secondary | ICD-10-CM

## 2015-12-06 DIAGNOSIS — F32A Depression, unspecified: Secondary | ICD-10-CM

## 2015-12-06 MED ORDER — LISINOPRIL-HYDROCHLOROTHIAZIDE 10-12.5 MG PO TABS: 1.0000 | ORAL_TABLET | Freq: Every day | ORAL | Status: DC

## 2015-12-06 MED ORDER — FENOFIBRIC ACID 135 MG PO CPDR: DELAYED_RELEASE_CAPSULE | ORAL | Status: DC

## 2015-12-06 MED ORDER — LEVOTHYROXINE SODIUM 125 MCG PO TABS: 125.0000 ug | ORAL_TABLET | Freq: Every day | ORAL | Status: DC

## 2015-12-06 MED ORDER — ALPRAZOLAM 1 MG PO TABS: ORAL_TABLET | ORAL | Status: DC

## 2015-12-06 MED ORDER — ZOLPIDEM TARTRATE 10 MG PO TABS: 10.0000 mg | ORAL_TABLET | Freq: Every day | ORAL | Status: DC

## 2015-12-06 MED ORDER — GABAPENTIN 300 MG PO CAPS: 300.0000 mg | ORAL_CAPSULE | Freq: Three times a day (TID) | ORAL | Status: DC

## 2015-12-06 MED ORDER — ATORVASTATIN CALCIUM 40 MG PO TABS: ORAL_TABLET | ORAL | Status: DC

## 2015-12-06 MED ORDER — ESCITALOPRAM OXALATE 20 MG PO TABS: 20.0000 mg | ORAL_TABLET | Freq: Every day | ORAL | Status: DC

## 2015-12-06 NOTE — Addendum Note (Signed)
Addended by: Chevis Pretty on: 12/06/2015 09:57 AM   Modules accepted: Orders

## 2015-12-06 NOTE — Progress Notes (Addendum)
Subjective:    Patient ID: IRVEN INGALSBE, male    DOB: 11-Dec-1965, 50 y.o.   MRN: [id number]  Patient here today for follow up of chronic medical problems.  Outpatient Encounter Prescriptions as of 12/06/2015  Medication Sig  . ALPRAZolam (XANAX) 1 MG tablet TAKE  (1)  TABLET TWICE A DAY AS NEEDED.  Marland Kitchen atorvastatin (LIPITOR) 40 MG tablet TAKE 1 TABLET ONCE A DAY  . Choline Fenofibrate (FENOFIBRIC ACID) 135 MG CPDR TAKE (1) CAPSULE DAILY  . escitalopram (LEXAPRO) 20 MG tablet TAKE 1 TABLET DAILY  . hydrocortisone (ANUSOL-HC) 2.5 % rectal cream Place 1 application rectally 2 (two) times daily. Let patient know when rx is ready for pick up  . levothyroxine (SYNTHROID, LEVOTHROID) 125 MCG tablet Take 1 tablet (125 mcg total) by mouth daily.  Marland Kitchen lisinopril-hydrochlorothiazide (PRINZIDE,ZESTORETIC) 10-12.5 MG tablet Take 1 tablet by mouth daily.  Marland Kitchen zolpidem (AMBIEN) 10 MG tablet TAKE ONE TABLET AT BEDTIME  . gabapentin (NEURONTIN) 600 MG tablet TAKE (1) TABLET THREE TIMES DAILY. (Patient not taking: Reported on 03/08/2015)    Hyperlipidemia This is a chronic problem. The current episode started more than 1 year ago. The problem is controlled. Exacerbating diseases include hypothyroidism. He has no history of diabetes or obesity. Pertinent negatives include no chest pain, myalgias or shortness of breath. Current antihyperlipidemic treatment includes statins and fibric acid derivatives. The current treatment provides moderate improvement of lipids. Compliance problems include adherence to diet and adherence to exercise.  Risk factors for coronary artery disease include dyslipidemia, hypertension and male sex.  Thyroid Problem Presents for follow-up visit. Patient reports no diaphoresis, fatigue or palpitations. The symptoms have been stable. His past medical history is significant for hyperlipidemia. There is no history of diabetes.  Hypertension This is a chronic problem. The current episode started  more than 1 year ago. The problem is unchanged. The problem is controlled. Pertinent negatives include no chest pain, palpitations or shortness of breath. Risk factors for coronary artery disease include dyslipidemia and male gender. Past treatments include ACE inhibitors and diuretics. The current treatment provides moderate improvement. Compliance problems include diet and exercise.  Hypertensive end-organ damage includes a thyroid problem. There is no history of CAD/MI or CVA.  depression lexapro- working well. Able to tolerate day to day situations with out getting angry- Doesn't seem to be working as well as it use to. Patient says he has been having sexual side effects- patient wants to switch to wellbutrin. Insomnia Sleeping okay on ambien. Usually feels rested in AM. DDD Patient has had back problems for many years. He is currently not taking anything other than goody powders- was on percocet in the past and gabapentin. Has not had theses in [place]. Has been taking a lot of tylenol and has had elevated LFT's. Rates pain right now a 4-5/10 and it radiates down left leg at times- says left toes are num a lot. Joint pain and stiffness worse in mornings. Would like to try backon gabapentin and hod off on percocet for now.     Review of Systems  Constitutional: Negative for diaphoresis and fatigue.  HENT: Negative.   Respiratory: Negative for shortness of breath.   Cardiovascular: Negative for chest pain and palpitations.  Genitourinary: Negative.   Musculoskeletal: Negative for myalgias.  Neurological: Negative.   Psychiatric/Behavioral: Negative.   All other systems reviewed and are negative.      Objective:   Physical Exam  Constitutional: He is oriented to person, place, and time.  He appears well-developed and well-nourished.  HENT:  Head: Normocephalic.  Right Ear: External ear normal.  Left Ear: External ear normal.  Nose: Nose normal.  Mouth/Throat: Oropharynx is clear and  moist.  Eyes: EOM are normal. Pupils are equal, round, and reactive to light.  Neck: Normal range of motion. Neck supple. No thyromegaly present.  Cardiovascular: Normal rate, regular rhythm, normal heart sounds and intact distal pulses.   No murmur heard. Pulmonary/Chest: Effort normal and breath sounds normal. He has no wheezes. He has no rales.  Abdominal: Soft. Bowel sounds are normal.  Musculoskeletal: Normal range of motion.  Neurological: He is alert and oriented to person, place, and time.  Skin: Skin is warm and dry.  Psychiatric: He has a normal mood and affect. His behavior is normal. Judgment and thought content normal.     BP 122/87 mmHg  Pulse 85  Temp(Src) 97.4 F (36.3 C) (Oral)  Ht '5\' 11"'  (1.803 m)  Wt 189 lb (85.73 kg)  BMI 26.37 kg/m2       Assessment & Plan:   1. Hyperlipidemia  Low fat diet - Choline Fenofibrate (FENOFIBRIC ACID) 135 MG CPDR; TAKE (1) CAPSULE DAILY  Dispense: 30 capsule; Refill: 5 - atorvastatin (LIPITOR) 40 MG tablet; TAKE 1 TABLET ONCE A DAY  Dispense: 30 tablet; Refill: 0  2. Hypothyroidism due to acquired atrophy of thyroid - levothyroxine (SYNTHROID, LEVOTHROID) 125 MCG tablet; Take 1 tablet (125 mcg total) by mouth daily.  Dispense: 90 tablet; Refill: 3  3. Insomnia Bedtime ritual - zolpidem (AMBIEN) 10 MG tablet; Take 1 tablet (10 mg total) by mouth at bedtime.  Dispense: 30 tablet; Refill: 2  4. Depression Stress management - escitalopram (LEXAPRO) 20 MG tablet; Take 1 tablet (20 mg total) by mouth daily.  Dispense: 90 tablet; Refill: 1  5. GAD (generalized anxiety disorder) - ALPRAZolam (XANAX) 1 MG tablet; TAKE  (1)  TABLET TWICE A DAY AS NEEDED.  Dispense: 60 tablet; Refill: 2  6. DDD (degenerative disc disease), lumbar Added gabapentin - gabapentin (NEURONTIN) 300 MG capsule; Take 1 capsule (300 mg total) by mouth 3 (three) times daily.  Dispense: 90 capsule; Refill: 3  7. Essential hypertension Do not add salt to  diet - lisinopril-hydrochlorothiazide (PRINZIDE,ZESTORETIC) 10-12.5 MG tablet; Take 1 tablet by mouth daily.  Dispense: 90 tablet; Refill: 1  Orders Placed This Encounter  Procedures  . CMP14+EGFR  . Lipid panel  . PSA, total and free     Labs pending Health maintenance reviewed Diet and exercise encouraged Continue all meds Follow up  In 6 month   [place], FNP

## 2015-12-06 NOTE — Patient Instructions (Signed)

## 2015-12-07 ENCOUNTER — Other Ambulatory Visit: Payer: Self-pay | Admitting: Nurse Practitioner

## 2015-12-07 LAB — CMP14+EGFR
ALBUMIN: 4.5 g/dL (ref 3.5–5.5)
ALT: 123 IU/L — ABNORMAL HIGH (ref 0–44)
AST: 44 IU/L — ABNORMAL HIGH (ref 0–40)
Albumin/Globulin Ratio: 1.8 (ref 1.2–2.2)
Alkaline Phosphatase: 120 IU/L — ABNORMAL HIGH (ref 39–117)
BILIRUBIN TOTAL: 0.9 mg/dL (ref 0.0–1.2)
BUN / CREAT RATIO: 13 (ref 9–20)
BUN: 13 mg/dL (ref 6–24)
CO2: 26 mmol/L (ref 18–29)
CREATININE: 0.99 mg/dL (ref 0.76–1.27)
Calcium: 9.3 mg/dL (ref 8.7–10.2)
Chloride: 100 mmol/L (ref 96–106)
GFR calc non Af Amer: 90 mL/min/{1.73_m2} (ref >59)
GFR, EST AFRICAN AMERICAN: 104 mL/min/{1.73_m2} (ref >59)
GLOBULIN, TOTAL: 2.5 g/dL (ref 1.5–4.5)
GLUCOSE: 110 mg/dL — AB (ref 65–99)
Potassium: 4.3 mmol/L (ref 3.5–5.2)
SODIUM: 142 mmol/L (ref 134–144)
TOTAL PROTEIN: 7 g/dL (ref 6.0–8.5)

## 2015-12-07 LAB — LIPID PANEL
CHOL/HDL RATIO: 5.5 ratio — AB (ref 0.0–5.0)
Cholesterol, Total: 231 mg/dL — ABNORMAL HIGH (ref 100–199)
HDL: 42 mg/dL (ref >39)
LDL CALC: 149 mg/dL — AB (ref 0–99)
Triglycerides: 199 mg/dL — ABNORMAL HIGH (ref 0–149)
VLDL CHOLESTEROL CAL: 40 mg/dL (ref 5–40)

## 2015-12-07 LAB — PSA, TOTAL AND FREE
PROSTATE SPECIFIC AG, SERUM: 0.6 ng/mL (ref 0.0–4.0)
PSA FREE PCT: 33.3 %
PSA FREE: 0.2 ng/mL

## 2015-12-09 ENCOUNTER — Encounter: Payer: Self-pay | Admitting: Nurse Practitioner

## 2015-12-09 LAB — HEPATITIS PANEL, ACUTE
HEP A IGM: NEGATIVE
Hep B C IgM: NEGATIVE
Hepatitis B Surface Ag: NEGATIVE

## 2015-12-09 LAB — SPECIMEN STATUS REPORT

## 2015-12-10 LAB — THYROID PANEL WITH TSH
FREE THYROXINE INDEX: 2.6 (ref 1.2–4.9)
T3 Uptake Ratio: 24 % (ref 24–39)
T4 TOTAL: 10.8 ug/dL (ref 4.5–12.0)
TSH: 0.957 u[IU]/mL (ref 0.450–4.500)

## 2015-12-10 LAB — SPECIMEN STATUS REPORT

## 2015-12-26 ENCOUNTER — Other Ambulatory Visit: Payer: Self-pay | Admitting: Nurse Practitioner

## 2015-12-26 NOTE — Telephone Encounter (Signed)
rx called into pharmacy

## 2015-12-26 NOTE — Telephone Encounter (Signed)
Last seen 12/06/15  MMM If approved route to nurse to call into [facility]

## 2015-12-26 NOTE — Telephone Encounter (Signed)
Please call in xanax with 1 refills 

## 2016-02-14 ENCOUNTER — Other Ambulatory Visit: Payer: Self-pay | Admitting: Nurse Practitioner

## 2016-02-14 NOTE — Telephone Encounter (Signed)
rx called into pharmacy

## 2016-02-14 NOTE — Telephone Encounter (Signed)
Please call in ambien with 1 refills 

## 2016-02-14 NOTE — Telephone Encounter (Signed)
Last seen 12/06/15  MMM  If approved route to nurse to call into [facility]

## 2016-03-01 ENCOUNTER — Other Ambulatory Visit: Payer: Self-pay | Admitting: Nurse Practitioner

## 2016-03-01 NOTE — Telephone Encounter (Signed)
rx called into pharmacy

## 2016-03-01 NOTE — Telephone Encounter (Signed)
Last seen 12/06/15 MMM If approved route to nurse to call into [facility]

## 2016-04-26 ENCOUNTER — Encounter: Payer: Self-pay | Admitting: Nurse Practitioner

## 2016-04-26 ENCOUNTER — Ambulatory Visit (INDEPENDENT_AMBULATORY_CARE_PROVIDER_SITE_OTHER): Payer: BLUE CROSS/BLUE SHIELD | Admitting: Nurse Practitioner

## 2016-04-26 VITALS — BP 127/79 | HR 83 | Temp 97.3°F | Ht 71.0 in | Wt 192.0 lb

## 2016-04-26 DIAGNOSIS — J0101 Acute recurrent maxillary sinusitis: Secondary | ICD-10-CM | POA: Diagnosis not present

## 2016-04-26 MED ORDER — DOXYCYCLINE HYCLATE 100 MG PO TABS: 100.0000 mg | ORAL_TABLET | Freq: Two times a day (BID) | ORAL | 0 refills | Status: DC

## 2016-04-26 NOTE — Progress Notes (Signed)
Subjective:     Kenneth [name] is a 50 y.o. male who presents for evaluation of sinus pain. Symptoms include: clear rhinorrhea, congestion, cough, foul rhinorrhea, headaches, nasal congestion and sore throat. Onset of symptoms was 2 weeks ago. Symptoms have been gradually worsening since that time. Past history is significant for frequent episodes of bronchitis. Patient is a non-smoker.  * he is also C/co severe fatigue that started about a week after getting a tick bite. The following portions of the patient's history were reviewed and updated as appropriate: allergies, current medications, past family history, past medical history, past social history, past surgical history and problem list.  Review of Systems Pertinent items noted in HPI and remainder of comprehensive ROS otherwise negative.   Objective:    BP 127/79   Pulse 83   Temp 97.3 F (36.3 C) (Oral)   Ht 5\' 11"  (1.803 m)   Wt 192 lb (87.1 kg)   BMI 26.78 kg/m  General appearance: alert and cooperative Ears: normal TM's and external ear canals both ears Nose: clear and copious discharge, mild congestion, turbinates red, sinus tenderness bilateral Throat: lips, mucosa, and tongue normal; teeth and gums normal Neck: no adenopathy, no JVD, supple, symmetrical, trachea midline and thyroid not enlarged, symmetric, no tenderness/mass/nodules Lungs: clear to auscultation bilaterally Heart: regular rate and rhythm, S1, S2 normal, no murmur, click, rub or gallop    Assessment:    Acute bacterial sinusitis Fatigue/tick bite    Plan:   1. Take meds as prescribed 2. Use a cool mist humidifier especially during the winter months and when heat has been humid. 3. Use saline nose sprays frequently 4. Saline irrigations of the nose can be very helpful if done frequently.  * 4X daily for 1 week*  * Use of a nettie pot can be helpful with this. Follow directions with this* 5. Drink plenty of fluids 6. Keep thermostat turn down  low 7.For any cough or congestion  Use plain Mucinex- regular strength or max strength is fine   * Children- consult with Pharmacist for dosing 8. For fever or aces or pains- take tylenol or ibuprofen appropriate for age and weight.  * for fevers greater than 101 orally you may alternate ibuprofen and tylenol every  3 hours.  Meds ordered this encounter  Medications  . doxycycline (VIBRA-TABS) 100 MG tablet    Sig: Take 1 tablet (100 mg total) by mouth 2 (two) times daily. 1 po bid    Dispense:  28 tablet    Refill:  0    Order Specific Question:   Supervising Provider    Answer:   Eustaquio Maize [[id number]]   Mary-Margaret Hassell Done, FNP

## 2016-04-26 NOTE — Patient Instructions (Signed)

## 2016-05-08 ENCOUNTER — Other Ambulatory Visit: Payer: Self-pay | Admitting: Nurse Practitioner

## 2016-05-08 NOTE — Telephone Encounter (Signed)
Please call in ambien with 1 refills 

## 2016-05-08 NOTE — Telephone Encounter (Signed)
Last filled 02/14/16. If approved please route to pool and have nurse call into pharmacy

## 2016-05-08 NOTE — Telephone Encounter (Signed)
Prescription called in

## 2016-05-30 ENCOUNTER — Encounter: Payer: Self-pay | Admitting: Nurse Practitioner

## 2016-05-31 ENCOUNTER — Other Ambulatory Visit: Payer: Self-pay | Admitting: Nurse Practitioner

## 2016-05-31 ENCOUNTER — Encounter: Payer: Self-pay | Admitting: Nurse Practitioner

## 2016-05-31 DIAGNOSIS — M5136 Other intervertebral disc degeneration, lumbar region: Secondary | ICD-10-CM

## 2016-06-05 ENCOUNTER — Encounter: Payer: Self-pay | Admitting: Nurse Practitioner

## 2016-06-05 ENCOUNTER — Ambulatory Visit (INDEPENDENT_AMBULATORY_CARE_PROVIDER_SITE_OTHER): Payer: BLUE CROSS/BLUE SHIELD | Admitting: Nurse Practitioner

## 2016-06-05 VITALS — BP 120/86 | HR 77 | Temp 96.9°F | Ht 71.0 in | Wt 191.0 lb

## 2016-06-05 DIAGNOSIS — M5136 Other intervertebral disc degeneration, lumbar region: Secondary | ICD-10-CM

## 2016-06-05 DIAGNOSIS — Z23 Encounter for immunization: Secondary | ICD-10-CM

## 2016-06-05 MED ORDER — TRAMADOL HCL 50 MG PO TABS
50.0000 mg | ORAL_TABLET | Freq: Three times a day (TID) | ORAL | 0 refills | Status: DC | PRN
Start: 2016-06-05 — End: 2016-08-24

## 2016-06-05 MED ORDER — GABAPENTIN 600 MG PO TABS: 600.0000 mg | ORAL_TABLET | Freq: Three times a day (TID) | ORAL | 2 refills | Status: DC

## 2016-06-05 NOTE — Progress Notes (Signed)
   Subjective:    Patient ID: Kenneth [name], male    DOB: Jul 16, 1966, 50 y.o.   MRN: [id number]  HPI Patient in today c/o increasing back pain- he increased his neurotin 300mg  TID which has helped some. He use to be percocet but was abe to come off of those over a year ago. He wants to increase the neurotin and try to avoid percocet. Pain is in lower back and radiates down right leg all the way down to toes. Neurosurgeon says that he has nerve damage because did not have back surgery soon enough.    Review of Systems  Constitutional: Negative.   HENT: Negative.   Respiratory: Negative.   Cardiovascular: Negative.   Genitourinary: Negative.   Neurological: Negative.   Psychiatric/Behavioral: Negative.   All other systems reviewed and are negative.      Objective:   Physical Exam  Constitutional: He is oriented to person, place, and time. He appears well-developed and well-nourished.  Cardiovascular: Normal rate, regular rhythm and normal heart sounds.   Pulmonary/Chest: Effort normal.  Musculoskeletal:  FROM of lumbar spine with pain on flexion and extension (+) SLR on right at 90 degrees Right leg weaker then left  Neurological: He is alert and oriented to person, place, and time. He has normal reflexes. No cranial nerve deficit.  Skin: Skin is warm.  Psychiatric: He has a normal mood and affect. His behavior is normal. Judgment and thought content normal.   BP 120/86   Pulse 77   Temp (!) 96.9 F (36.1 C) (Oral)   Ht 5\' 11"  (1.803 m)   Wt 191 lb (86.6 kg)   BMI 26.64 kg/m       Assessment & Plan:  1. DDD (degenerative disc disease), lumbar Back stretches Moist heat RTO prn - gabapentin (NEURONTIN) 600 MG tablet; Take 1 tablet (600 mg total) by mouth 3 (three) times daily. Gradually increase to full tablet TID  Dispense: 90 tablet; Refill: 2 - traMADol (ULTRAM) 50 MG tablet; Take 1 tablet (50 mg total) by mouth every 8 (eight) hours as needed.  Dispense: 30 tablet;  Refill: 0  Mary-Margaret Hassell Done, FNP

## 2016-06-12 ENCOUNTER — Other Ambulatory Visit: Payer: Self-pay | Admitting: Family

## 2016-06-12 NOTE — Telephone Encounter (Signed)
Please call in xanax with 1 refills 

## 2016-06-12 NOTE — Telephone Encounter (Signed)
Refill called to [facility] 

## 2016-06-20 ENCOUNTER — Other Ambulatory Visit: Payer: Self-pay | Admitting: *Deleted

## 2016-06-20 DIAGNOSIS — I10 Essential (primary) hypertension: Secondary | ICD-10-CM

## 2016-06-20 MED ORDER — LISINOPRIL-HYDROCHLOROTHIAZIDE 10-12.5 MG PO TABS: 1.0000 | ORAL_TABLET | Freq: Every day | ORAL | 1 refills | Status: DC

## 2016-07-11 ENCOUNTER — Other Ambulatory Visit: Payer: Self-pay | Admitting: Nurse Practitioner

## 2016-07-11 NOTE — Telephone Encounter (Signed)
Last filled 04/28/16. Call in

## 2016-07-12 NOTE — Telephone Encounter (Signed)
Please call in amben with 1 refills

## 2016-08-20 ENCOUNTER — Other Ambulatory Visit: Payer: Self-pay | Admitting: Nurse Practitioner

## 2016-08-20 DIAGNOSIS — M5136 Other intervertebral disc degeneration, lumbar region: Secondary | ICD-10-CM

## 2016-08-21 ENCOUNTER — Other Ambulatory Visit: Payer: Self-pay | Admitting: Nurse Practitioner

## 2016-08-21 DIAGNOSIS — M5136 Other intervertebral disc degeneration, lumbar region: Secondary | ICD-10-CM

## 2016-08-24 ENCOUNTER — Encounter: Payer: Self-pay | Admitting: Nurse Practitioner

## 2016-08-24 ENCOUNTER — Ambulatory Visit (INDEPENDENT_AMBULATORY_CARE_PROVIDER_SITE_OTHER): Payer: BLUE CROSS/BLUE SHIELD | Admitting: Nurse Practitioner

## 2016-08-24 VITALS — BP 130/89 | HR 79 | Temp 97.8°F | Ht 71.0 in | Wt 199.0 lb

## 2016-08-24 DIAGNOSIS — M5136 Other intervertebral disc degeneration, lumbar region: Secondary | ICD-10-CM

## 2016-08-24 DIAGNOSIS — Z0289 Encounter for other administrative examinations: Secondary | ICD-10-CM

## 2016-08-24 MED ORDER — TRAMADOL HCL 50 MG PO TABS: 50.0000 mg | ORAL_TABLET | Freq: Three times a day (TID) | ORAL | 0 refills | Status: DC | PRN

## 2016-08-24 NOTE — Telephone Encounter (Signed)
Appt made for today

## 2016-08-24 NOTE — Progress Notes (Signed)
Subjective:    Patient ID: Kenneth [name], male    DOB: 1966/05/23, 50 y.o.   MRN: [id number]  HPI Patient comes in today for pain assessment. [place] Controlled Substance Abuse database reviewed- Yes If yes- were their any concerning findings : no suspicious activiy Depression screen [facility] 2/9 08/24/2016 06/05/2016 04/26/2016 12/06/2015 03/08/2015  Decreased Interest 1 1 0 2 2  Down, Depressed, Hopeless 1 1 0 1 2  PHQ - 2 Score 2 2 0 3 4  Altered sleeping 1 2 - 3 2  Tired, decreased energy 2 3 - 3 3  Change in appetite 0 1 - 1 0  Feeling bad or failure about yourself  1 0 - 1 1  Trouble concentrating 0 0 - 0 0  Moving slowly or fidgety/restless 1 1 - 0 3  Suicidal thoughts 0 1 - 0 1  PHQ-9 Score 7 10 - 11 14    GAD 7 : Generalized Anxiety Score 08/24/2016  Nervous, Anxious, on Edge 1  Control/stop worrying 3  Worry too much - different things 3  Trouble relaxing 3  Restless 0  Easily annoyed or irritable 0  Afraid - awful might happen 1  Total GAD 7 Score 11  Anxiety Difficulty Not difficult at all   [place] drug screen performed- Yes  SOAPP  0= never  1= seldom  2=sometimes  3= often  4= very often  How often do you have mood swings? 1 How often do you smoke a cigarette within an hour after waling up? 0 How often have you taken medication other than the way that it was prescribed?0 How often have you used illegal drugs in the past 5 years? 0 How often, in your lifetime, have you had legal problems or been arrested? 0  Score 1  Alcohol Audit - How often during the last year have found that you: 0-Never   1- Less than monthly   2- Monthly     3-Weekly     4-daily or almost daily  - found that you were not able to stop drinking once you started- 0 -failed to do what was normally expected of you because of drinking- 0 -needed a first drink in the morning- 0 -had a feeling of guilt or remorse after drinking- 0 -are/were unable to remember what happened the  night before because of your drinking- 0  0- NO   2- yes but not in last year  4- yes during last year -Have you or someone else been injured because of your drinking- 0 - Has anyone been concerned about your drinking or suggested you cut down- 0        TOTAL- 0  ( 0-7- alcohol education, 8-15- simple advice, 16-19 simple advice plus counseling, 20-40 referral for evaluation and treatment 0   [place]  Pain assessment: Cause of pain- back pain- unknown cause Pain location- lower back Pain on scale of 1-10- 3-4/10 today Frequency- daily What increases pain-activity What makes pain Better-resting Effects on ADL - sometimes but not every day.  Prior treatments tried and failed- has had 2 previous back surgeries and physical Therapy Current treatments- neurotin decreases tha amount of pain meds he takes- Last ultram rx of 30 tablet slasted him 3 months Morphine mg equivalent- 5  Pain management agreement reviewed and signed- Yes  * Joint pain is increasing and would like rheumatology referral after first of year.   Review of Systems  Constitutional: Negative.  HENT: Negative.   Respiratory: Negative.   Cardiovascular: Negative.   Genitourinary: Negative.   Musculoskeletal: Positive for back pain.  Neurological: Negative.   Psychiatric/Behavioral: Negative.   All other systems reviewed and are negative.      Objective:   Physical Exam  Constitutional: He is oriented to person, place, and time. He appears well-developed and well-nourished. No distress.  Cardiovascular: Normal rate, regular rhythm and normal heart sounds.   Pulmonary/Chest: Effort normal and breath sounds normal.  Musculoskeletal:  FROM of lower back with pain on flexion and extension (-) SLR bil Motor strength and sensation distally intact  Neurological: He is alert and oriented to person, place, and time.  Skin: Skin is warm.  Psychiatric: He has a normal mood and affect.  His behavior is normal. Judgment and thought content normal.   BP 130/89   Pulse 79   Temp 97.8 F (36.6 C) (Oral)   Ht 5\' 11"  (1.803 m)   Wt 199 lb (90.3 kg)   BMI 27.75 kg/m        Assessment & Plan:  1. DDD (degenerative disc disease), lumbar Moist heat Stretching exercises - traMADol (ULTRAM) 50 MG tablet; Take 1 tablet (50 mg total) by mouth every 8 (eight) hours as needed.  Dispense: 30 tablet; Refill: 0  2. Pain management contract agreement   Mary-Margaret Hassell Done, FNP

## 2016-08-24 NOTE — Telephone Encounter (Signed)
Cannot get pain meds without being seen

## 2016-09-12 ENCOUNTER — Telehealth: Payer: Self-pay | Admitting: *Deleted

## 2016-09-12 ENCOUNTER — Other Ambulatory Visit: Payer: Self-pay | Admitting: Nurse Practitioner

## 2016-09-12 DIAGNOSIS — F329 Major depressive disorder, single episode, unspecified: Secondary | ICD-10-CM

## 2016-09-12 DIAGNOSIS — F32A Depression, unspecified: Secondary | ICD-10-CM

## 2016-09-12 NOTE — Telephone Encounter (Signed)
Please call in ambien with 1 refills 

## 2016-09-12 NOTE — Telephone Encounter (Signed)
Script for Ambien called in.

## 2016-09-29 ENCOUNTER — Other Ambulatory Visit: Payer: Self-pay | Admitting: Nurse Practitioner

## 2016-09-29 DIAGNOSIS — M5136 Other intervertebral disc degeneration, lumbar region: Secondary | ICD-10-CM

## 2016-10-24 ENCOUNTER — Other Ambulatory Visit: Payer: Self-pay | Admitting: Nurse Practitioner

## 2016-10-24 NOTE — Telephone Encounter (Signed)
OK to call in xanax 1mg  take BID prn #60 tabs no refills

## 2016-10-24 NOTE — Telephone Encounter (Signed)
last seen MMM 08/2016 - if xanax is approved - route to nurse to phone in

## 2016-10-25 NOTE — Telephone Encounter (Signed)
rx called into pharmacy and pt is aware. 

## 2016-11-02 ENCOUNTER — Other Ambulatory Visit: Payer: Self-pay | Admitting: Nurse Practitioner

## 2016-11-02 DIAGNOSIS — M5136 Other intervertebral disc degeneration, lumbar region: Secondary | ICD-10-CM

## 2016-11-03 NOTE — Telephone Encounter (Signed)
Cannot fill ultram without an appointment- office policy

## 2016-11-22 ENCOUNTER — Other Ambulatory Visit: Payer: Self-pay | Admitting: Nurse Practitioner

## 2016-11-22 DIAGNOSIS — M5136 Other intervertebral disc degeneration, lumbar region: Secondary | ICD-10-CM

## 2016-11-27 ENCOUNTER — Other Ambulatory Visit: Payer: Self-pay | Admitting: Nurse Practitioner

## 2016-11-27 NOTE — Telephone Encounter (Signed)
Please call in alprazolam and ambien with 1 each refills

## 2016-11-27 NOTE — Telephone Encounter (Signed)
Refills called to [facility]

## 2017-01-01 ENCOUNTER — Other Ambulatory Visit: Payer: Self-pay | Admitting: Nurse Practitioner

## 2017-01-01 DIAGNOSIS — E034 Atrophy of thyroid (acquired): Secondary | ICD-10-CM

## 2017-01-01 DIAGNOSIS — M5136 Other intervertebral disc degeneration, lumbar region: Secondary | ICD-10-CM

## 2017-01-01 DIAGNOSIS — I10 Essential (primary) hypertension: Secondary | ICD-10-CM

## 2017-01-30 ENCOUNTER — Other Ambulatory Visit: Payer: Self-pay | Admitting: Nurse Practitioner

## 2017-01-30 DIAGNOSIS — M5136 Other intervertebral disc degeneration, lumbar region: Secondary | ICD-10-CM

## 2017-01-30 DIAGNOSIS — E034 Atrophy of thyroid (acquired): Secondary | ICD-10-CM

## 2017-01-30 DIAGNOSIS — I10 Essential (primary) hypertension: Secondary | ICD-10-CM

## 2017-01-31 NOTE — Telephone Encounter (Signed)
Rx called in 

## 2017-01-31 NOTE — Telephone Encounter (Signed)
Please call in xanax and ambien with 0 refills Last refill without being seen

## 2017-01-31 NOTE — Telephone Encounter (Signed)
Last filled 01/01/17, last seen 08/24/17. Call both in

## 2017-03-01 ENCOUNTER — Other Ambulatory Visit: Payer: Self-pay | Admitting: Nurse Practitioner

## 2017-03-01 DIAGNOSIS — I10 Essential (primary) hypertension: Secondary | ICD-10-CM

## 2017-03-01 DIAGNOSIS — M5136 Other intervertebral disc degeneration, lumbar region: Secondary | ICD-10-CM

## 2017-03-01 DIAGNOSIS — E034 Atrophy of thyroid (acquired): Secondary | ICD-10-CM

## 2017-03-04 NOTE — Telephone Encounter (Signed)
Last seen 12/17  MMM   Last thyroid level 12/06/15   If approved route to nurse to call into [facility]

## 2017-03-04 NOTE — Telephone Encounter (Signed)
Phoned in both

## 2017-03-04 NOTE — Telephone Encounter (Signed)
Please call in alprazolam and ambien with o refills Last refill without being seen

## 2017-04-02 ENCOUNTER — Other Ambulatory Visit: Payer: Self-pay | Admitting: Nurse Practitioner

## 2017-04-02 DIAGNOSIS — M5136 Other intervertebral disc degeneration, lumbar region: Secondary | ICD-10-CM

## 2017-04-02 DIAGNOSIS — I10 Essential (primary) hypertension: Secondary | ICD-10-CM

## 2017-04-02 DIAGNOSIS — E034 Atrophy of thyroid (acquired): Secondary | ICD-10-CM

## 2017-04-03 NOTE — Telephone Encounter (Signed)
LMOVM for pt to call back NTBS

## 2017-04-08 ENCOUNTER — Telehealth: Payer: Self-pay | Admitting: Nurse Practitioner

## 2017-04-08 DIAGNOSIS — E034 Atrophy of thyroid (acquired): Secondary | ICD-10-CM

## 2017-04-08 DIAGNOSIS — I10 Essential (primary) hypertension: Secondary | ICD-10-CM

## 2017-04-08 MED ORDER — LEVOTHYROXINE SODIUM 125 MCG PO TABS: 125.0000 ug | ORAL_TABLET | Freq: Every day | ORAL | 0 refills | Status: DC

## 2017-04-08 MED ORDER — LISINOPRIL-HYDROCHLOROTHIAZIDE 10-12.5 MG PO TABS: 1.0000 | ORAL_TABLET | Freq: Every day | ORAL | 0 refills | Status: DC

## 2017-04-08 NOTE — Telephone Encounter (Signed)
Please advise Last lab 11/2015

## 2017-04-23 ENCOUNTER — Ambulatory Visit (INDEPENDENT_AMBULATORY_CARE_PROVIDER_SITE_OTHER): Payer: 59 | Admitting: Nurse Practitioner

## 2017-04-23 ENCOUNTER — Encounter: Payer: Self-pay | Admitting: Nurse Practitioner

## 2017-04-23 VITALS — BP 109/79 | HR 90 | Temp 97.5°F | Ht 71.0 in | Wt 195.0 lb

## 2017-04-23 DIAGNOSIS — F5101 Primary insomnia: Secondary | ICD-10-CM | POA: Diagnosis not present

## 2017-04-23 DIAGNOSIS — M5136 Other intervertebral disc degeneration, lumbar region: Secondary | ICD-10-CM

## 2017-04-23 DIAGNOSIS — E034 Atrophy of thyroid (acquired): Secondary | ICD-10-CM

## 2017-04-23 DIAGNOSIS — F3342 Major depressive disorder, recurrent, in full remission: Secondary | ICD-10-CM

## 2017-04-23 DIAGNOSIS — E7800 Pure hypercholesterolemia, unspecified: Secondary | ICD-10-CM | POA: Diagnosis not present

## 2017-04-23 DIAGNOSIS — F411 Generalized anxiety disorder: Secondary | ICD-10-CM

## 2017-04-23 DIAGNOSIS — I10 Essential (primary) hypertension: Secondary | ICD-10-CM

## 2017-04-23 MED ORDER — CITALOPRAM HYDROBROMIDE 20 MG PO TABS: 20.0000 mg | ORAL_TABLET | Freq: Every day | ORAL | 5 refills | Status: DC

## 2017-04-23 MED ORDER — LEVOTHYROXINE SODIUM 125 MCG PO TABS: 125.0000 ug | ORAL_TABLET | Freq: Every day | ORAL | 1 refills | Status: DC

## 2017-04-23 MED ORDER — ZOLPIDEM TARTRATE 10 MG PO TABS: 10.0000 mg | ORAL_TABLET | Freq: Every day | ORAL | 3 refills | Status: DC

## 2017-04-23 MED ORDER — ALPRAZOLAM 1 MG PO TABS: ORAL_TABLET | ORAL | 3 refills | Status: DC

## 2017-04-23 MED ORDER — GABAPENTIN 600 MG PO TABS: ORAL_TABLET | ORAL | 1 refills | Status: DC

## 2017-04-23 MED ORDER — LISINOPRIL-HYDROCHLOROTHIAZIDE 10-12.5 MG PO TABS: 1.0000 | ORAL_TABLET | Freq: Every day | ORAL | 1 refills | Status: DC

## 2017-04-23 NOTE — Addendum Note (Signed)
Addended by: Chevis Pretty on: 04/23/2017 03:31 PM   Modules accepted: Orders

## 2017-04-23 NOTE — Patient Instructions (Signed)

## 2017-04-23 NOTE — Addendum Note (Signed)
Addended by: Chevis Pretty on: 04/23/2017 03:28 PM   Modules accepted: Orders

## 2017-04-23 NOTE — Progress Notes (Addendum)
Subjective:    Patient ID: Kenneth [name], male    DOB: 04/09/1966, 51 y.o.   MRN: [id number]  HPI  Kenneth [name] is here today for follow up of chronic medical problem.  Outpatient Encounter Prescriptions as of 04/23/2017  Medication Sig  . gabapentin (NEURONTIN) 600 MG tablet Take 1 tablet 3 times daily. Gradually increase to full tablet 3 times daily  . hydrocortisone (ANUSOL-HC) 2.5 % rectal cream Place 1 application rectally 2 (two) times daily. Let patient know when rx is ready for pick up  . levothyroxine (SYNTHROID, LEVOTHROID) 125 MCG tablet Take 1 tablet (125 mcg total) by mouth daily.  Marland Kitchen lisinopril-hydrochlorothiazide (PRINZIDE,ZESTORETIC) 10-12.5 MG tablet Take 1 tablet by mouth daily.  Marland Kitchen PROCTOZONE-HC 2.5 % rectal cream APPLY RECTALLY 2 TIMES A DAY AS DIRECTED  . zolpidem (AMBIEN) 10 MG tablet TAKE ONE TABLET AT BEDTIME  . [DISCONTINUED] escitalopram (LEXAPRO) 20 MG tablet Take 1 tablet (20 mg total) by mouth daily.  Marland Kitchen ALPRAZolam (XANAX) 1 MG tablet TAKE  (1)  TABLET TWICE A DAY AS NEEDED.     1. Essential hypertension  No c/o chest pain, sob or headache. Does not check blood pressures at home  2. Hypothyroidism due to acquired atrophy of thyroid  No problems that he is awareof  3. DDD (degenerative disc disease), lumbar  Has had several back surgeries. Has chronic pain. Stop pain medication for awhile after surgery then pain returned. He does ot want to be on pain meds because he cannot go without xanax and he would have to stop that. Still on neurotin daily  4. Primary insomnia  Cannot sleep without ambien  5. Pure hypercholesterolemia  Has been walking for exercise 2-3 days a week- tries to avoid fried foods. Has not been taking cholesterol meds.  6. GAD (generalized anxiety disorder)  Takes xanax 2 x a day  7. Recurrent major depressive disorder, in full remission ([place])  Was on lexapro which caused weight gain so he stopped taking. Overall felt better on  lexapro but could not handle the weight gain.    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    Review of Systems  Constitutional: Negative for activity change and appetite change.  HENT: Negative.   Eyes: Negative for pain.  Respiratory: Negative for shortness of breath.   Cardiovascular: Negative for chest pain, palpitations and leg swelling.  Gastrointestinal: Negative for abdominal pain.  Endocrine: Negative for polydipsia.  Genitourinary: Negative.   Musculoskeletal: Positive for back pain.  Skin: Negative for rash.  Neurological: Negative for dizziness, weakness and headaches.  Hematological: Does not bruise/bleed easily.  Psychiatric/Behavioral: Negative.   All other systems reviewed and are negative.      Objective:   Physical Exam  Constitutional: He is oriented to person, place, and time. He appears well-developed and well-nourished.  HENT:  Head: Normocephalic.  Right Ear: External ear normal.  Left Ear: External ear normal.  Nose: Nose normal.  Mouth/Throat: Oropharynx is clear and moist.  Eyes: Pupils are equal, round, and reactive to light. EOM are normal.  Neck: Normal range of motion. Neck supple. No JVD present. No thyromegaly present.  Cardiovascular: Normal rate, regular rhythm, normal heart sounds and intact distal pulses.  Exam reveals no gallop and no friction rub.   No murmur heard. Pulmonary/Chest: Effort normal and breath sounds normal. No respiratory distress. He has no wheezes. He  has no rales. He exhibits no tenderness.  Abdominal: Soft. Bowel sounds are normal. He exhibits no mass. There is no tenderness.  Genitourinary: Prostate normal and penis normal.  Musculoskeletal: Normal range of motion. He exhibits no edema.  Low grad back opian with flexion and extension  Lymphadenopathy:    He has no cervical adenopathy.  Neurological: He is alert  and oriented to person, place, and time. No cranial nerve deficit.  Skin: Skin is warm and dry.  Psychiatric: He has a normal mood and affect. His behavior is normal. Judgment and thought content normal.    BP 109/79   Pulse 90   Temp (!) 97.5 F (36.4 C) (Oral)   Ht '5\' 11"'  (1.803 m)   Wt 195 lb (88.5 kg)   BMI 27.20 kg/m       Assessment & Plan:  1. Essential hypertension Low sodium diet - lisinopril-hydrochlorothiazide (PRINZIDE,ZESTORETIC) 10-12.5 MG tablet; Take 1 tablet by mouth daily.  Dispense: 90 tablet; Refill: 1 - CMP14+EGFR  2. Hypothyroidism due to acquired atrophy of thyroid Labs pending - levothyroxine (SYNTHROID, LEVOTHROID) 125 MCG tablet; Take 1 tablet (125 mcg total) by mouth daily.  Dispense: 90 tablet; Refill: 1  3. DDD (degenerative disc disease), lumbar Stretching exercises - gabapentin (NEURONTIN) 600 MG tablet; Take 1 tablet 3 times daily. Gradually increase to full tablet 3 times daily  Dispense: 90 tablet; Refill: 1  4. Primary insomnia Bedtime routine - zolpidem (AMBIEN) 10 MG tablet; Take 1 tablet (10 mg total) by mouth at bedtime.  Dispense: 30 tablet; Refill: 3  5. Pure hypercholesterolemia Low fat diet - Lipid panel  6. GAD (generalized anxiety disorder) Stress management - ALPRAZolam (XANAX) 1 MG tablet; TAKE  (1)  TABLET TWICE A DAY AS NEEDED.  Dispense: 60 tablet; Refill: 3  7. Recurrent major depressive disorder, in full remission ([place]) - started celexa 79m daily- #30 3 refills    Labs pending Health maintenance reviewed Diet and exercise encouraged Continue all meds Follow up  In 3 month   [place] FNP

## 2017-04-24 LAB — CMP14+EGFR
A/G RATIO: 1.7 (ref 1.2–2.2)
ALT: 109 IU/L — ABNORMAL HIGH (ref 0–44)
AST: 58 IU/L — ABNORMAL HIGH (ref 0–40)
Albumin: 4.4 g/dL (ref 3.5–5.5)
Alkaline Phosphatase: 95 IU/L (ref 39–117)
BUN/Creatinine Ratio: 13 (ref 9–20)
BUN: 14 mg/dL (ref 6–24)
Bilirubin Total: 0.7 mg/dL (ref 0.0–1.2)
CALCIUM: 9.2 mg/dL (ref 8.7–10.2)
CO2: 26 mmol/L (ref 20–29)
Chloride: 98 mmol/L (ref 96–106)
Creatinine, Ser: 1.06 mg/dL (ref 0.76–1.27)
GFR, EST AFRICAN AMERICAN: 94 mL/min/{1.73_m2} (ref >59)
GFR, EST NON AFRICAN AMERICAN: 81 mL/min/{1.73_m2} (ref >59)
GLOBULIN, TOTAL: 2.6 g/dL (ref 1.5–4.5)
Glucose: 120 mg/dL — ABNORMAL HIGH (ref 65–99)
POTASSIUM: 3.7 mmol/L (ref 3.5–5.2)
SODIUM: 140 mmol/L (ref 134–144)
TOTAL PROTEIN: 7 g/dL (ref 6.0–8.5)

## 2017-04-24 LAB — LIPID PANEL
CHOL/HDL RATIO: 8 ratio — AB (ref 0.0–5.0)
Cholesterol, Total: 257 mg/dL — ABNORMAL HIGH (ref 100–199)
HDL: 32 mg/dL — AB (ref >39)
Triglycerides: 826 mg/dL (ref 0–149)

## 2017-06-04 ENCOUNTER — Other Ambulatory Visit: Payer: Self-pay | Admitting: Nurse Practitioner

## 2017-06-04 DIAGNOSIS — F329 Major depressive disorder, single episode, unspecified: Secondary | ICD-10-CM

## 2017-06-04 DIAGNOSIS — F32A Depression, unspecified: Secondary | ICD-10-CM

## 2017-07-25 ENCOUNTER — Encounter: Payer: Self-pay | Admitting: Nurse Practitioner

## 2017-07-25 ENCOUNTER — Ambulatory Visit (INDEPENDENT_AMBULATORY_CARE_PROVIDER_SITE_OTHER): Payer: 59 | Admitting: Nurse Practitioner

## 2017-07-25 VITALS — BP 114/80 | HR 85 | Temp 96.8°F | Ht 71.0 in | Wt 190.0 lb

## 2017-07-25 DIAGNOSIS — Z125 Encounter for screening for malignant neoplasm of prostate: Secondary | ICD-10-CM | POA: Diagnosis not present

## 2017-07-25 DIAGNOSIS — E034 Atrophy of thyroid (acquired): Secondary | ICD-10-CM | POA: Diagnosis not present

## 2017-07-25 DIAGNOSIS — E7800 Pure hypercholesterolemia, unspecified: Secondary | ICD-10-CM | POA: Diagnosis not present

## 2017-07-25 DIAGNOSIS — I10 Essential (primary) hypertension: Secondary | ICD-10-CM

## 2017-07-25 DIAGNOSIS — F329 Major depressive disorder, single episode, unspecified: Secondary | ICD-10-CM

## 2017-07-25 DIAGNOSIS — M5136 Other intervertebral disc degeneration, lumbar region: Secondary | ICD-10-CM | POA: Diagnosis not present

## 2017-07-25 DIAGNOSIS — F411 Generalized anxiety disorder: Secondary | ICD-10-CM

## 2017-07-25 DIAGNOSIS — F5101 Primary insomnia: Secondary | ICD-10-CM

## 2017-07-25 DIAGNOSIS — F3342 Major depressive disorder, recurrent, in full remission: Secondary | ICD-10-CM | POA: Diagnosis not present

## 2017-07-25 DIAGNOSIS — F32A Depression, unspecified: Secondary | ICD-10-CM

## 2017-07-25 MED ORDER — ZOLPIDEM TARTRATE 10 MG PO TABS: 10.0000 mg | ORAL_TABLET | Freq: Every day | ORAL | 3 refills | Status: DC

## 2017-07-25 MED ORDER — ESCITALOPRAM OXALATE 20 MG PO TABS: 20.0000 mg | ORAL_TABLET | Freq: Every day | ORAL | 0 refills | Status: DC

## 2017-07-25 MED ORDER — LISINOPRIL-HYDROCHLOROTHIAZIDE 10-12.5 MG PO TABS: 1.0000 | ORAL_TABLET | Freq: Every day | ORAL | 1 refills | Status: DC

## 2017-07-25 MED ORDER — ALPRAZOLAM 1 MG PO TABS: ORAL_TABLET | ORAL | 3 refills | Status: DC

## 2017-07-25 MED ORDER — LEVOTHYROXINE SODIUM 125 MCG PO TABS: 125.0000 ug | ORAL_TABLET | Freq: Every day | ORAL | 1 refills | Status: DC

## 2017-07-25 NOTE — Progress Notes (Signed)
Subjective:    Patient ID: Kenneth [name], male    DOB: 10/29/1965, 51 y.o.   MRN: [id number]  HPI  Kenneth [name] is here today for follow up of chronic medical problem.  Outpatient Encounter Medications as of 07/25/2017  Medication Sig  . ALPRAZolam (XANAX) 1 MG tablet TAKE  (1)  TABLET TWICE A DAY AS NEEDED.  Marland Kitchen escitalopram (LEXAPRO) 20 MG tablet Take 1 tablet (20 mg total) by mouth daily.  Marland Kitchen gabapentin (NEURONTIN) 600 MG tablet Take 1 tablet 3 times daily. Gradually increase to full tablet 3 times daily (Patient taking differently: Take 600 mg 2 (two) times daily by mouth. Take 1 tablet 3 times daily. Gradually increase to full tablet 3 times daily)  . hydrocortisone (ANUSOL-HC) 2.5 % rectal cream Place 1 application rectally 2 (two) times daily. Let patient know when rx is ready for pick up  . levothyroxine (SYNTHROID, LEVOTHROID) 125 MCG tablet Take 1 tablet (125 mcg total) by mouth daily.  Marland Kitchen lisinopril-hydrochlorothiazide (PRINZIDE,ZESTORETIC) 10-12.5 MG tablet Take 1 tablet by mouth daily.  Marland Kitchen PROCTOZONE-HC 2.5 % rectal cream APPLY RECTALLY 2 TIMES A DAY AS DIRECTED  . zolpidem (AMBIEN) 10 MG tablet Take 1 tablet (10 mg total) by mouth at bedtime.  . citalopram (CELEXA) 20 MG tablet Take 1 tablet (20 mg total) by mouth daily.   No facility-administered encounter medications on file as of 07/25/2017.     1. Essential hypertension  No c/o chest pain, sob or headache. Does noyt check blood pressure at home. BP Readings from Last 3 Encounters:  07/25/17 114/80  04/23/17 109/79  08/24/16 130/89     2. Hypothyroidism due to acquired atrophy of thyroid  No problems that he is aware of  3. DDD (degenerative disc disease), lumbar  Patient still having occasional back pain. Is not taking anything other then neurotin for pain- no more pain meds.  4. Pure hypercholesterolemia  Time to repeat labs. Has not been watching diet  5. GAD (generalized anxiety disorder)  Takes xanax  usually 1x a days and occasionally in in the evening  6. Recurrent major depressive disorder, in full remission [facility])  Patient has been on lexapro for awhile and is doing quite well Depression screen [facility] 2/9 07/25/2017 04/23/2017 08/24/2016  Decreased Interest '1 1 1  ' Down, Depressed, Hopeless '1 1 1  ' PHQ - 2 Score '2 2 2  ' Altered sleeping '2 3 1  ' Tired, decreased energy '3 2 2  ' Change in appetite 0 1 0  Feeling bad or failure about yourself  0 1 1  Trouble concentrating 0 0 0  Moving slowly or fidgety/restless 0 1 1  Suicidal thoughts 0 0 0  PHQ-9 Score '7 10 7     ' 7. 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  HENT: Negative.   Eyes: Negative for pain.  Respiratory: Negative for shortness of breath.   Cardiovascular: Negative for chest pain, palpitations and leg swelling.  Gastrointestinal: Negative for abdominal pain.  Endocrine: Negative for polydipsia.  Genitourinary: Negative.   Skin: Negative for rash.  Neurological: Negative for dizziness, weakness and headaches.  Hematological: Does not bruise/bleed easily.  Psychiatric/Behavioral: Negative.   All other systems reviewed and are negative.      Objective:   Physical Exam  Constitutional: He  is oriented to person, place, and time. He appears well-developed and well-nourished.  HENT:  Head: Normocephalic.  Right Ear: External ear normal.  Left Ear: External ear normal.  Nose: Nose normal.  Mouth/Throat: Oropharynx is clear and moist.  Eyes: EOM are normal. Pupils are equal, round, and reactive to light.  Neck: Normal range of motion. Neck supple. No JVD present. No thyromegaly present.  Cardiovascular: Normal rate, regular rhythm, normal heart sounds and intact distal pulses. Exam reveals no gallop and no friction rub.  No murmur  heard. Pulmonary/Chest: Effort normal and breath sounds normal. No respiratory distress. He has no wheezes. He has no rales. He exhibits no tenderness.  Abdominal: Soft. Bowel sounds are normal. He exhibits no mass. There is no tenderness.  Genitourinary: Prostate normal and penis normal.  Musculoskeletal: Normal range of motion. He exhibits no edema.  Lymphadenopathy:    He has no cervical adenopathy.  Neurological: He is alert and oriented to person, place, and time. No cranial nerve deficit.  Skin: Skin is warm and dry.  Psychiatric: He has a normal mood and affect. His behavior is normal. Judgment and thought content normal.   BP 114/80   Pulse 85   Temp (!) 96.8 F (36 C) (Oral)   Ht '5\' 11"'  (1.803 m)   Wt 190 lb (86.2 kg)   BMI 26.50 kg/m       Assessment & Plan:  1. Essential hypertension Low sodium diet - lisinopril-hydrochlorothiazide (PRINZIDE,ZESTORETIC) 10-12.5 MG tablet; Take 1 tablet daily by mouth.  Dispense: 90 tablet; Refill: 1 - CMP14+EGFR  2. Hypothyroidism due to acquired atrophy of thyroid - levothyroxine (SYNTHROID, LEVOTHROID) 125 MCG tablet; Take 1 tablet (125 mcg total) daily by mouth.  Dispense: 90 tablet; Refill: 1 - Thyroid Panel With TSH  3. DDD (degenerative disc disease), lumbar Back stretching  4. Pure hypercholesterolemia Low fat diet - Lipid panel  5. GAD (generalized anxiety disorder) Stress management - ALPRAZolam (XANAX) 1 MG tablet; TAKE  (1)  TABLET TWICE A DAY AS NEEDED.  Dispense: 60 tablet; Refill: 3  6. Recurrent major depressive disorder, in full remission ([place])   7. Primary insomnia Bedtime routine - zolpidem (AMBIEN) 10 MG tablet; Take 1 tablet (10 mg total) at bedtime by mouth.  Dispense: 30 tablet; Refill: 3  8. Depression - escitalopram (LEXAPRO) 20 MG tablet; Take 1 tablet (20 mg total) daily by mouth.  Dispense: 90 tablet; Refill: 0  9. Prostate cancer screening - PSA, total and free    Labs pending Health  maintenance reviewed Diet and exercise encouraged Continue all meds Follow up  In 6 months   [place], FNP

## 2017-07-26 ENCOUNTER — Other Ambulatory Visit: Payer: Self-pay | Admitting: Nurse Practitioner

## 2017-07-26 LAB — CMP14+EGFR
ALT: 88 IU/L — AB (ref 0–44)
AST: 61 IU/L — ABNORMAL HIGH (ref 0–40)
Albumin/Globulin Ratio: 1.9 (ref 1.2–2.2)
Albumin: 4.5 g/dL (ref 3.5–5.5)
Alkaline Phosphatase: 93 IU/L (ref 39–117)
BUN/Creatinine Ratio: 16 (ref 9–20)
BUN: 18 mg/dL (ref 6–24)
Bilirubin Total: 0.6 mg/dL (ref 0.0–1.2)
CALCIUM: 9 mg/dL (ref 8.7–10.2)
CHLORIDE: 101 mmol/L (ref 96–106)
CO2: 23 mmol/L (ref 20–29)
CREATININE: 1.13 mg/dL (ref 0.76–1.27)
GFR, EST AFRICAN AMERICAN: 87 mL/min/{1.73_m2} (ref >59)
GFR, EST NON AFRICAN AMERICAN: 75 mL/min/{1.73_m2} (ref >59)
GLUCOSE: 83 mg/dL (ref 65–99)
Globulin, Total: 2.4 g/dL (ref 1.5–4.5)
Potassium: 4.1 mmol/L (ref 3.5–5.2)
Sodium: 143 mmol/L (ref 134–144)
TOTAL PROTEIN: 6.9 g/dL (ref 6.0–8.5)

## 2017-07-26 LAB — PSA, TOTAL AND FREE
PROSTATE SPECIFIC AG, SERUM: 0.7 ng/mL (ref 0.0–4.0)
PSA, Free Pct: 30 %
PSA, Free: 0.21 ng/mL

## 2017-07-26 LAB — THYROID PANEL WITH TSH
Free Thyroxine Index: 2.4 (ref 1.2–4.9)
T3 Uptake Ratio: 27 % (ref 24–39)
T4, Total: 8.9 ug/dL (ref 4.5–12.0)
TSH: 7.11 u[IU]/mL — AB (ref 0.450–4.500)

## 2017-07-26 LAB — LIPID PANEL
Chol/HDL Ratio: 7.6 ratio — ABNORMAL HIGH (ref 0.0–5.0)
Cholesterol, Total: 252 mg/dL — ABNORMAL HIGH (ref 100–199)
HDL: 33 mg/dL — AB (ref >39)
Triglycerides: 588 mg/dL (ref 0–149)

## 2017-07-26 MED ORDER — LEVOTHYROXINE SODIUM 150 MCG PO TABS: 150.0000 ug | ORAL_TABLET | Freq: Every day | ORAL | 11 refills | Status: DC

## 2017-08-09 ENCOUNTER — Telehealth: Payer: Self-pay | Admitting: Nurse Practitioner

## 2017-08-09 NOTE — Telephone Encounter (Signed)
Forward to pcp/mmm

## 2017-08-09 NOTE — Telephone Encounter (Signed)
Covering PCP- please advise  

## 2017-08-13 MED ORDER — PREDNISONE 10 MG (21) PO TBPK: ORAL_TABLET | ORAL | 0 refills | Status: DC

## 2017-08-13 NOTE — Telephone Encounter (Deleted)
Patient knows she has to be seen to get pain medication

## 2017-08-13 NOTE — Telephone Encounter (Signed)
Pt aware rx sent into pharmacy. 

## 2017-08-13 NOTE — Telephone Encounter (Signed)
Prednisone rx sent to pharmacy.

## 2017-10-25 DIAGNOSIS — M21371 Foot drop, right foot: Secondary | ICD-10-CM | POA: Diagnosis not present

## 2017-11-18 ENCOUNTER — Other Ambulatory Visit: Payer: Self-pay | Admitting: Nurse Practitioner

## 2017-11-18 DIAGNOSIS — M5136 Other intervertebral disc degeneration, lumbar region: Secondary | ICD-10-CM

## 2017-11-18 DIAGNOSIS — F5101 Primary insomnia: Secondary | ICD-10-CM

## 2017-11-18 DIAGNOSIS — F411 Generalized anxiety disorder: Secondary | ICD-10-CM

## 2017-12-13 ENCOUNTER — Other Ambulatory Visit: Payer: Self-pay | Admitting: Nurse Practitioner

## 2017-12-13 DIAGNOSIS — F32A Depression, unspecified: Secondary | ICD-10-CM

## 2017-12-13 DIAGNOSIS — F329 Major depressive disorder, single episode, unspecified: Secondary | ICD-10-CM

## 2017-12-13 DIAGNOSIS — M5136 Other intervertebral disc degeneration, lumbar region: Secondary | ICD-10-CM

## 2017-12-13 DIAGNOSIS — F411 Generalized anxiety disorder: Secondary | ICD-10-CM

## 2017-12-13 DIAGNOSIS — F5101 Primary insomnia: Secondary | ICD-10-CM

## 2018-01-13 ENCOUNTER — Other Ambulatory Visit: Payer: Self-pay | Admitting: Nurse Practitioner

## 2018-01-13 DIAGNOSIS — M5136 Other intervertebral disc degeneration, lumbar region: Secondary | ICD-10-CM

## 2018-01-13 DIAGNOSIS — F32A Depression, unspecified: Secondary | ICD-10-CM

## 2018-01-13 DIAGNOSIS — F329 Major depressive disorder, single episode, unspecified: Secondary | ICD-10-CM

## 2018-02-10 ENCOUNTER — Other Ambulatory Visit: Payer: Self-pay | Admitting: Nurse Practitioner

## 2018-02-10 DIAGNOSIS — F32A Depression, unspecified: Secondary | ICD-10-CM

## 2018-02-10 DIAGNOSIS — F329 Major depressive disorder, single episode, unspecified: Secondary | ICD-10-CM

## 2018-02-10 DIAGNOSIS — F5101 Primary insomnia: Secondary | ICD-10-CM

## 2018-02-10 DIAGNOSIS — F411 Generalized anxiety disorder: Secondary | ICD-10-CM

## 2018-02-20 ENCOUNTER — Ambulatory Visit (INDEPENDENT_AMBULATORY_CARE_PROVIDER_SITE_OTHER): Payer: 59 | Admitting: Nurse Practitioner

## 2018-02-20 ENCOUNTER — Encounter: Payer: Self-pay | Admitting: Nurse Practitioner

## 2018-02-20 VITALS — BP 132/88 | HR 67 | Temp 97.0°F | Ht 71.0 in | Wt 186.0 lb

## 2018-02-20 DIAGNOSIS — F3342 Major depressive disorder, recurrent, in full remission: Secondary | ICD-10-CM | POA: Diagnosis not present

## 2018-02-20 DIAGNOSIS — Z1211 Encounter for screening for malignant neoplasm of colon: Secondary | ICD-10-CM

## 2018-02-20 DIAGNOSIS — Z1212 Encounter for screening for malignant neoplasm of rectum: Secondary | ICD-10-CM | POA: Diagnosis not present

## 2018-02-20 DIAGNOSIS — Z0289 Encounter for other administrative examinations: Secondary | ICD-10-CM | POA: Diagnosis not present

## 2018-02-20 DIAGNOSIS — E7800 Pure hypercholesterolemia, unspecified: Secondary | ICD-10-CM | POA: Diagnosis not present

## 2018-02-20 DIAGNOSIS — F411 Generalized anxiety disorder: Secondary | ICD-10-CM | POA: Diagnosis not present

## 2018-02-20 DIAGNOSIS — F329 Major depressive disorder, single episode, unspecified: Secondary | ICD-10-CM | POA: Diagnosis not present

## 2018-02-20 DIAGNOSIS — I10 Essential (primary) hypertension: Secondary | ICD-10-CM | POA: Diagnosis not present

## 2018-02-20 DIAGNOSIS — E034 Atrophy of thyroid (acquired): Secondary | ICD-10-CM | POA: Diagnosis not present

## 2018-02-20 DIAGNOSIS — F32A Depression, unspecified: Secondary | ICD-10-CM

## 2018-02-20 DIAGNOSIS — M51369 Other intervertebral disc degeneration, lumbar region without mention of lumbar back pain or lower extremity pain: Secondary | ICD-10-CM

## 2018-02-20 DIAGNOSIS — M5136 Other intervertebral disc degeneration, lumbar region: Secondary | ICD-10-CM | POA: Diagnosis not present

## 2018-02-20 DIAGNOSIS — F5101 Primary insomnia: Secondary | ICD-10-CM

## 2018-02-20 MED ORDER — ALPRAZOLAM 1 MG PO TABS: 1.0000 mg | ORAL_TABLET | Freq: Two times a day (BID) | ORAL | 2 refills | Status: DC | PRN

## 2018-02-20 MED ORDER — ZOLPIDEM TARTRATE 10 MG PO TABS: 10.0000 mg | ORAL_TABLET | Freq: Every day | ORAL | 2 refills | Status: DC

## 2018-02-20 MED ORDER — ESCITALOPRAM OXALATE 20 MG PO TABS: 20.0000 mg | ORAL_TABLET | Freq: Every day | ORAL | 0 refills | Status: DC

## 2018-02-20 MED ORDER — GABAPENTIN 600 MG PO TABS
ORAL_TABLET | ORAL | 1 refills | Status: DC
Start: 2018-02-20 — End: 2019-07-27

## 2018-02-20 MED ORDER — LEVOTHYROXINE SODIUM 150 MCG PO TABS: 150.0000 ug | ORAL_TABLET | Freq: Every day | ORAL | 11 refills | Status: DC

## 2018-02-20 MED ORDER — LISINOPRIL-HYDROCHLOROTHIAZIDE 10-12.5 MG PO TABS: 1.0000 | ORAL_TABLET | Freq: Every day | ORAL | 1 refills | Status: DC

## 2018-02-20 NOTE — Patient Instructions (Signed)
Stress and Stress Management Stress is a normal reaction to life events. It is what you feel when life demands more than you are used to or more than you can handle. Some stress can be useful. For example, the stress reaction can help you catch the last bus of the day, study for a test, or meet a deadline at work. But stress that occurs too often or for too long can cause problems. It can affect your emotional health and interfere with relationships and normal daily activities. Too much stress can weaken your immune system and increase your risk for physical illness. If you already have a medical problem, stress can make it worse. What are the causes? All sorts of life events may cause stress. An event that causes stress for one person may not be stressful for another person. Major life events commonly cause stress. These may be positive or negative. Examples include losing your job, moving into a new home, getting married, having a baby, or losing a loved one. Less obvious life events may also cause stress, especially if they occur day after day or in combination. Examples include working long hours, driving in traffic, caring for children, being in debt, or being in a difficult relationship. What are the signs or symptoms? Stress may cause emotional symptoms including, the following:  Anxiety. This is feeling worried, afraid, on edge, overwhelmed, or out of control.  Anger. This is feeling irritated or impatient.  Depression. This is feeling sad, down, helpless, or guilty.  Difficulty focusing, remembering, or making decisions.  Stress may cause physical symptoms, including the following:  Aches and pains. These may affect your head, neck, back, stomach, or other areas of your body.  Tight muscles or clenched jaw.  Low energy or trouble sleeping.  Stress may cause unhealthy behaviors, including the following:  Eating to feel better (overeating) or skipping meals.  Sleeping too little,  too much, or both.  Working too much or putting off tasks (procrastination).  Smoking, drinking alcohol, or using drugs to feel better.  How is this diagnosed? Stress is diagnosed through an assessment by your health care provider. Your health care provider will ask questions about your symptoms and any stressful life events.Your health care provider will also ask about your medical history and may order blood tests or other tests. Certain medical conditions and medicine can cause physical symptoms similar to stress. Mental illness can cause emotional symptoms and unhealthy behaviors similar to stress. Your health care provider may refer you to a mental health professional for further evaluation. How is this treated? Stress management is the recommended treatment for stress.The goals of stress management are reducing stressful life events and coping with stress in healthy ways. Techniques for reducing stressful life events include the following:  Stress identification. Self-monitor for stress and identify what causes stress for you. These skills may help you to avoid some stressful events.  Time management. Set your priorities, keep a calendar of events, and learn to say "no." These tools can help you avoid making too many commitments.  Techniques for coping with stress include the following:  Rethinking the problem. Try to think realistically about stressful events rather than ignoring them or overreacting. Try to find the positives in a stressful situation rather than focusing on the negatives.  Exercise. Physical exercise can release both physical and emotional tension. The key is to find a form of exercise you enjoy and do it regularly.  Relaxation techniques. These relax the body and  mind. Examples include yoga, meditation, tai chi, biofeedback, deep breathing, progressive muscle relaxation, listening to music, being out in nature, journaling, and other hobbies. Again, the key is to find  one or more that you enjoy and can do regularly.  Healthy lifestyle. Eat a balanced diet, get plenty of sleep, and do not smoke. Avoid using alcohol or drugs to relax.  Strong support network. Spend time with family, friends, or other people you enjoy being around.Express your feelings and talk things over with someone you trust.  Counseling or talktherapy with a mental health professional may be helpful if you are having difficulty managing stress on your own. Medicine is typically not recommended for the treatment of stress.Talk to your health care provider if you think you need medicine for symptoms of stress. Follow these instructions at home:  Keep all follow-up visits as directed by your health care provider.  Take all medicines as directed by your health care provider. Contact a health care provider if:  Your symptoms get worse or you start having new symptoms.  You feel overwhelmed by your problems and can no longer manage them on your own. Get help right away if:  You feel like hurting yourself or someone else. This information is not intended to replace advice given to you by your health care provider. Make sure you discuss any questions you have with your health care provider. Document Released: 02/20/2001 Document Revised: 02/02/2016 Document Reviewed: 04/21/2013 Elsevier Interactive Patient Education  2017 Elsevier Inc.  

## 2018-02-20 NOTE — Progress Notes (Signed)
Subjective:    Patient ID: Kenneth [name], male    DOB: [birth year], 52 y.o.   MRN: [id number]   Chief Complaint: Medical Management of Chronic Issues   HPI:  1. Essential hypertension  No c/o chest pain, SOB or headaches. Does not check blood pressure at home. BP Readings from Last 3 Encounters:  02/20/18 132/88  07/25/17 114/80  04/23/17 109/79     2. Hypothyroidism due to acquired atrophy of thyroid  No probelms that he is aware of.  3. DDD (degenerative disc disease), lumbar  Has good days and bad days. neurotin helps. Has not needed much pain meds as of late  4. Pain management contract agreement  Currently not taking any pain meds. Just using motrin or ibuprofen when needed.  5. Primary insomnia  Still need ambien to sleep at night.he does not take it every night  6. Pure hypercholesterolemia  Currently not on any meds. Doe snot watch diet very closely and does very little exercise  7. GAD (generalized anxiety disorder)  Takes xanax daily. keesp him calm all day and from worrying so much  8. Recurrent major depressive disorder, in full remission ([place])  still taking lexapro daily    Outpatient Encounter Medications as of 02/20/2018  Medication Sig  . ALPRAZolam (XANAX) 1 MG tablet TAKE (1) TABLET TWICE A DAY AS NEEDED.  Marland Kitchen escitalopram (LEXAPRO) 20 MG tablet Take 1 tablet (20 mg total) by mouth daily.  Marland Kitchen gabapentin (NEURONTIN) 600 MG tablet TAKE 1 TABLET UP TO 3 TIMES A DAY  . levothyroxine (SYNTHROID) 150 MCG tablet Take 1 tablet (150 mcg total) daily by mouth.  Marland Kitchen lisinopril-hydrochlorothiazide (PRINZIDE,ZESTORETIC) 10-12.5 MG tablet Take 1 tablet daily by mouth.  Marland Kitchen PROCTOZONE-HC 2.5 % rectal cream APPLY RECTALLY 2 TIMES A DAY AS DIRECTED  . zolpidem (AMBIEN) 10 MG tablet TAKE ONE TABLET AT BEDTIME     New complaints: None today  Social history: Owns his own business.    Review of Systems  Constitutional: Negative for activity change and appetite change.    HENT: Negative.   Eyes: Negative for pain.  Respiratory: Negative for shortness of breath.   Cardiovascular: Negative for chest pain, palpitations and leg swelling.  Gastrointestinal: Negative for abdominal pain.  Endocrine: Negative for polydipsia.  Genitourinary: Negative.   Skin: Negative for rash.  Neurological: Negative for dizziness, weakness and headaches.  Hematological: Does not bruise/bleed easily.  Psychiatric/Behavioral: Negative.   All other systems reviewed and are negative.      Objective:   Physical Exam  Constitutional: He is oriented to person, place, and time.  HENT:  Head: Normocephalic.  Nose: Nose normal.  Mouth/Throat: Oropharynx is clear and moist.  Eyes: Pupils are equal, round, and reactive to light. EOM are normal.  Neck: Normal range of motion and phonation normal. Neck supple. No JVD present. Carotid bruit is not present. No thyroid mass and no thyromegaly present.  Cardiovascular: Normal rate and regular rhythm.  Pulmonary/Chest: Effort normal and breath sounds normal. No respiratory distress.  Abdominal: Soft. Normal appearance, normal aorta and bowel sounds are normal. There is no tenderness.  Musculoskeletal: Normal range of motion.  Lymphadenopathy:    He has no cervical adenopathy.  Neurological: He is alert and oriented to person, place, and time.  Skin: Skin is warm and dry.  Psychiatric: He has a normal mood and affect. His behavior is normal. Judgment and thought content normal.   BP 132/88   Pulse 67   Temp (!)  97 F (36.1 C) (Oral)   Ht '5\' 11"'  (1.803 m)   Wt 186 lb (84.4 kg)   BMI 25.94 kg/m         Assessment & Plan:  Kenneth [name] comes in today with chief complaint of Medical Management of Chronic Issues   Diagnosis and orders addressed:  1. Essential hypertension Low sodium diet - lisinopril-hydrochlorothiazide (PRINZIDE,ZESTORETIC) 10-12.5 MG tablet; Take 1 tablet by mouth daily.  Dispense: 90 tablet; Refill:  1 - CMP14+EGFR  2. Hypothyroidism due to acquired atrophy of thyroid - Thyroid Panel With TSH  3. DDD (degenerative disc disease), lumbar ack stretches - gabapentin (NEURONTIN) 600 MG tablet; TAKE 1 TABLET UP TO 3 TIMES A DAY  Dispense: 90 tablet; Refill: 1  4. Pain management contract agreement Let me know if pain needs t obe readdressed  5. Primary insomnia .bedtime routine - zolpidem (AMBIEN) 10 MG tablet; Take 1 tablet (10 mg total) by mouth at bedtime.  Dispense: 30 tablet; Refill: 2 - Lipid panel  6. Pure hypercholesterolemia Low fat diet  7. GAD (generalized anxiety disorder) Stress management - ALPRAZolam (XANAX) 1 MG tablet; Take 1 tablet (1 mg total) by mouth 2 (two) times daily as needed for anxiety.  Dispense: 60 tablet; Refill: 2  8. Recurrent major depressive disorder, in full remission ([place]) - escitalopram (LEXAPRO) 20 MG tablet; Take 1 tablet (20 mg total) by mouth daily.  Dispense: 31 tablet; Refill: 0   Labs pending Health Maintenance reviewed Diet and exercise encouraged  Follow up plan: 6 months   Mary-Margaret Hassell Done, FNP

## 2018-02-21 LAB — CMP14+EGFR
A/G RATIO: 1.9 (ref 1.2–2.2)
ALT: 74 IU/L — AB (ref 0–44)
AST: 45 IU/L — ABNORMAL HIGH (ref 0–40)
Albumin: 4.5 g/dL (ref 3.5–5.5)
Alkaline Phosphatase: 93 IU/L (ref 39–117)
BILIRUBIN TOTAL: 0.6 mg/dL (ref 0.0–1.2)
BUN/Creatinine Ratio: 14 (ref 9–20)
BUN: 15 mg/dL (ref 6–24)
CALCIUM: 9.5 mg/dL (ref 8.7–10.2)
CHLORIDE: 100 mmol/L (ref 96–106)
CO2: 24 mmol/L (ref 20–29)
Creatinine, Ser: 1.09 mg/dL (ref 0.76–1.27)
GFR, EST AFRICAN AMERICAN: 91 mL/min/{1.73_m2} (ref >59)
GFR, EST NON AFRICAN AMERICAN: 79 mL/min/{1.73_m2} (ref >59)
GLOBULIN, TOTAL: 2.4 g/dL (ref 1.5–4.5)
Glucose: 100 mg/dL — ABNORMAL HIGH (ref 65–99)
POTASSIUM: 4.3 mmol/L (ref 3.5–5.2)
SODIUM: 142 mmol/L (ref 134–144)
TOTAL PROTEIN: 6.9 g/dL (ref 6.0–8.5)

## 2018-02-21 LAB — THYROID PANEL WITH TSH
FREE THYROXINE INDEX: 2.6 (ref 1.2–4.9)
T3 Uptake Ratio: 29 % (ref 24–39)
T4, Total: 8.9 ug/dL (ref 4.5–12.0)
TSH: 0.539 u[IU]/mL (ref 0.450–4.500)

## 2018-02-21 LAB — LIPID PANEL
CHOL/HDL RATIO: 6.9 ratio — AB (ref 0.0–5.0)
Cholesterol, Total: 256 mg/dL — ABNORMAL HIGH (ref 100–199)
HDL: 37 mg/dL — ABNORMAL LOW (ref >39)
LDL Calculated: 157 mg/dL — ABNORMAL HIGH (ref 0–99)
Triglycerides: 311 mg/dL — ABNORMAL HIGH (ref 0–149)
VLDL Cholesterol Cal: 62 mg/dL — ABNORMAL HIGH (ref 5–40)

## 2018-02-26 DIAGNOSIS — Z1211 Encounter for screening for malignant neoplasm of colon: Secondary | ICD-10-CM | POA: Diagnosis not present

## 2018-03-07 ENCOUNTER — Encounter: Payer: Self-pay | Admitting: Nurse Practitioner

## 2018-03-07 ENCOUNTER — Other Ambulatory Visit: Payer: Self-pay | Admitting: Nurse Practitioner

## 2018-03-08 ENCOUNTER — Other Ambulatory Visit: Payer: Self-pay | Admitting: Nurse Practitioner

## 2018-03-08 MED ORDER — PREDNISONE 10 MG (21) PO TBPK: ORAL_TABLET | ORAL | 0 refills | Status: DC

## 2018-03-10 LAB — COLOGUARD: Cologuard: NEGATIVE

## 2018-04-18 ENCOUNTER — Other Ambulatory Visit: Payer: Self-pay | Admitting: Nurse Practitioner

## 2018-04-18 DIAGNOSIS — F329 Major depressive disorder, single episode, unspecified: Secondary | ICD-10-CM

## 2018-04-18 DIAGNOSIS — F32A Depression, unspecified: Secondary | ICD-10-CM

## 2018-05-13 DIAGNOSIS — Z1283 Encounter for screening for malignant neoplasm of skin: Secondary | ICD-10-CM | POA: Diagnosis not present

## 2018-05-13 DIAGNOSIS — L03317 Cellulitis of buttock: Secondary | ICD-10-CM | POA: Diagnosis not present

## 2018-05-13 DIAGNOSIS — D225 Melanocytic nevi of trunk: Secondary | ICD-10-CM | POA: Diagnosis not present

## 2018-06-17 DIAGNOSIS — B078 Other viral warts: Secondary | ICD-10-CM | POA: Diagnosis not present

## 2018-06-17 DIAGNOSIS — D485 Neoplasm of uncertain behavior of skin: Secondary | ICD-10-CM | POA: Diagnosis not present

## 2018-06-17 DIAGNOSIS — D225 Melanocytic nevi of trunk: Secondary | ICD-10-CM | POA: Diagnosis not present

## 2018-06-19 ENCOUNTER — Other Ambulatory Visit: Payer: Self-pay | Admitting: Nurse Practitioner

## 2018-06-19 DIAGNOSIS — F5101 Primary insomnia: Secondary | ICD-10-CM

## 2018-06-19 NOTE — Telephone Encounter (Signed)
Last seen 02/20/18  MMM

## 2018-06-26 ENCOUNTER — Other Ambulatory Visit: Payer: Self-pay | Admitting: *Deleted

## 2018-06-26 DIAGNOSIS — F411 Generalized anxiety disorder: Secondary | ICD-10-CM

## 2018-06-27 MED ORDER — ALPRAZOLAM 1 MG PO TABS: 1.0000 mg | ORAL_TABLET | Freq: Two times a day (BID) | ORAL | 2 refills | Status: DC | PRN

## 2018-07-07 ENCOUNTER — Other Ambulatory Visit: Payer: Self-pay | Admitting: Nurse Practitioner

## 2018-07-24 ENCOUNTER — Other Ambulatory Visit: Payer: Self-pay | Admitting: Nurse Practitioner

## 2018-07-24 DIAGNOSIS — F32A Depression, unspecified: Secondary | ICD-10-CM

## 2018-07-24 DIAGNOSIS — F329 Major depressive disorder, single episode, unspecified: Secondary | ICD-10-CM

## 2018-08-20 ENCOUNTER — Other Ambulatory Visit: Payer: Self-pay | Admitting: Nurse Practitioner

## 2018-08-20 DIAGNOSIS — F5101 Primary insomnia: Secondary | ICD-10-CM

## 2018-08-21 NOTE — Telephone Encounter (Signed)
Last seen 02/20/18

## 2018-08-22 ENCOUNTER — Encounter: Payer: Self-pay | Admitting: Nurse Practitioner

## 2018-08-22 ENCOUNTER — Ambulatory Visit (INDEPENDENT_AMBULATORY_CARE_PROVIDER_SITE_OTHER): Payer: 59 | Admitting: Nurse Practitioner

## 2018-08-22 VITALS — BP 130/89 | HR 78 | Temp 97.8°F | Ht 71.0 in | Wt 190.0 lb

## 2018-08-22 DIAGNOSIS — F5101 Primary insomnia: Secondary | ICD-10-CM

## 2018-08-22 DIAGNOSIS — F3342 Major depressive disorder, recurrent, in full remission: Secondary | ICD-10-CM

## 2018-08-22 DIAGNOSIS — F329 Major depressive disorder, single episode, unspecified: Secondary | ICD-10-CM

## 2018-08-22 DIAGNOSIS — I10 Essential (primary) hypertension: Secondary | ICD-10-CM

## 2018-08-22 DIAGNOSIS — F411 Generalized anxiety disorder: Secondary | ICD-10-CM

## 2018-08-22 DIAGNOSIS — E034 Atrophy of thyroid (acquired): Secondary | ICD-10-CM

## 2018-08-22 DIAGNOSIS — Z23 Encounter for immunization: Secondary | ICD-10-CM | POA: Diagnosis not present

## 2018-08-22 DIAGNOSIS — M5136 Other intervertebral disc degeneration, lumbar region: Secondary | ICD-10-CM

## 2018-08-22 DIAGNOSIS — F32A Depression, unspecified: Secondary | ICD-10-CM

## 2018-08-22 DIAGNOSIS — E7800 Pure hypercholesterolemia, unspecified: Secondary | ICD-10-CM | POA: Diagnosis not present

## 2018-08-22 DIAGNOSIS — R5383 Other fatigue: Secondary | ICD-10-CM

## 2018-08-22 MED ORDER — LISINOPRIL-HYDROCHLOROTHIAZIDE 10-12.5 MG PO TABS: 1.0000 | ORAL_TABLET | Freq: Every day | ORAL | 1 refills | Status: DC

## 2018-08-22 MED ORDER — ESCITALOPRAM OXALATE 20 MG PO TABS: 20.0000 mg | ORAL_TABLET | Freq: Every day | ORAL | 1 refills | Status: DC

## 2018-08-22 MED ORDER — ALPRAZOLAM 1 MG PO TABS: 1.0000 mg | ORAL_TABLET | Freq: Two times a day (BID) | ORAL | 5 refills | Status: DC | PRN

## 2018-08-22 MED ORDER — ZOLPIDEM TARTRATE 10 MG PO TABS: 10.0000 mg | ORAL_TABLET | Freq: Every day | ORAL | 5 refills | Status: DC

## 2018-08-22 NOTE — Progress Notes (Signed)
Subjective:    Patient ID: Kenneth [name], male    DOB: 15-Oct-1965, 52 y.o.   MRN: [id number]   Chief Complaint: Medical Management of Chronic Issues (6 month)   HPI:  1. Essential hypertension  No c/o chest pain, sob or headache. Does not check blood pressure at home. BP Readings from Last 3 Encounters:  08/22/18 130/89  02/20/18 132/88  07/25/17 114/80     2. Hypothyroidism due to acquired atrophy of thyroid  His levothyroxine dose was changed to 150 in 02/2018 but he says his bottle at home says 116mg. He is c/p fatigue.  3. DDD (degenerative disc disease), lumbar  Back pain is some better. He has not been taking neurontin at all. Rates pain 4/10 but can tolerate it most days.  4. Primary insomnia  Cannot sleep at night without ambien.. some nights he can get by with 1/2 tablet.  5. Pure hypercholesterolemia  Has not really been watching diet very closely.  6. GAD (generalized anxiety disorder)  Takes xanax 11mbid. He stays very anxious without meds.  7. Recurrent major depressive disorder, in full remission ([facility] Still taking lexapro dialy and doing well. othe rthen fatigue making him hateful! [facility] 08[facility]22[facility]2018 02/20/2018 07/25/2017  Decreased Interest 3 0 1  Down, Depressed, Hopeless '1 1 1  ' PHQ - 2 Score '4 1 2  ' Altered sleeping 3 - 2  Tired, decreased energy 3 - 3  Change in appetite 0 - 0  Feeling bad or failure about yourself  1 - 0  Trouble concentrating 3 - 0  Moving slowly or fidgety/restless 3 - 0  Suicidal thoughts 0 - 0  PHQ-9 Score 17 - 7  Difficult doing work/chores Very difficult - -  Some recent data might be hidden       Outpatient Encounter Medications as of 08/22/2018  Medication Sig  . ALPRAZolam (XANAX) 1 MG tablet Take 1 tablet (1 mg total) by mouth 2 (two) times daily as needed for anxiety.  . Marland Kitchenscitalopram (LEXAPRO) 20 MG tablet TAKE 1 TABLET DAILY  . levothyroxine (SYNTHROID) 150 MCG tablet Take 1 tablet (150 mcg  total) by mouth daily. (Patient taking differently: Take 125 mcg by mouth daily. )  . lisinopril-hydrochlorothiazide (PRINZIDE,ZESTORETIC) 10-12.5 MG tablet Take 1 tablet by mouth daily.  . Marland Kitchenolpidem (AMBIEN) 10 MG tablet TAKE ONE TABLET AT BEDTIME  . gabapentin (NEURONTIN) 600 MG tablet TAKE 1 TABLET UP TO 3 TIMES A DAY (Patient not taking: Reported on 08/22/2018)  . PROCTOZONE-HC 2.5 % rectal cream APPLY RECTALLY 2 TIMES A DAY AS DIRECTED (Patient not taking: Reported on 08/22/2018)       New complaints: Nothing other then fatigue  Social history: Live swith wife who is not doing well right now with her health. 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  Review of Systems  Constitutional: Positive for fatigue.  HENT: Negative.   Respiratory: Negative.   Cardiovascular: Negative.   Gastrointestinal: Negative.   Genitourinary: Negative.   Skin: Negative.   Neurological: Negative.   Psychiatric/Behavioral: Negative.   All other systems reviewed and are negative.      Objective:   Physical Exam Vitals signs and nursing note reviewed.  Constitutional:      Appearance: Normal appearance. He is well-developed.  HENT:     Head: Normocephalic.     Nose: Nose normal.  Eyes:     Pupils: Pupils are equal, round, and reactive to light.  Neck:  Musculoskeletal: Normal range of motion and neck supple.     Thyroid: No thyroid mass or thyromegaly.     Vascular: No carotid bruit or JVD.     Trachea: Phonation normal.  Cardiovascular:     Rate and Rhythm: Normal rate and regular rhythm.  Pulmonary:     Effort: Pulmonary effort is normal. No respiratory distress.     Breath sounds: Normal breath sounds.  Abdominal:     General: Bowel sounds are normal.     Palpations: Abdomen is soft.     Tenderness: There is no abdominal tenderness.  Musculoskeletal: Normal range of motion.  Lymphadenopathy:     Cervical: No cervical adenopathy.  Skin:    General: Skin is warm and dry.    Neurological:     Mental Status: He is alert and oriented to person, place, and time.  Psychiatric:        Behavior: Behavior normal.        Thought Content: Thought content normal.        Judgment: Judgment normal.     Comments: He says he gets panicky when he goes to work. He does not know why .     BP 130/89   Pulse 78   Temp 97.8 F (36.6 C) (Oral)   Ht '5\' 11"'  (1.803 m)   Wt 190 lb (86.2 kg)   BMI 26.50 kg/m           Assessment & Plan:  Kenneth [name] comes in today with chief complaint of Medical Management of Chronic Issues (6 month)   Diagnosis and orders addressed:  1. Essential hypertension Low sodium diet - CMP14+EGFR - lisinopril-hydrochlorothiazide (PRINZIDE,ZESTORETIC) 10-12.5 MG tablet; Take 1 tablet by mouth daily.  Dispense: 90 tablet; Refill: 1  2. Hypothyroidism due to acquired atrophy of thyroid - Thyroid Panel With TSH  3. DDD (degenerative disc disease), lumbar Back stretches reviewed  4. Primary insomnia Bedtime routine - zolpidem (AMBIEN) 10 MG tablet; Take 1 tablet (10 mg total) by mouth at bedtime.  Dispense: 30 tablet; Refill: 5  5. Pure hypercholesterolemia Low fat diet - Lipid panel  6. GAD (generalized anxiety disorder) Stress management - ALPRAZolam (XANAX) 1 MG tablet; Take 1 tablet (1 mg total) by mouth 2 (two) times daily as needed for anxiety.  Dispense: 60 tablet; Refill: 5  7. Recurrent major depressive disorder, in full remission ([place]) - escitalopram (LEXAPRO) 20 MG tablet; Take 1 tablet (20 mg total) by mouth daily.  Dispense: 90 tablet; Refill: 1   8. Other fatigue Labs pending - Testosterone,Free and Total - CBC with Differential/Platelet    Labs pending Health Maintenance reviewed Diet and exercise encouraged  Follow up plan: 6 months   [place], FNP

## 2018-08-22 NOTE — Patient Instructions (Signed)

## 2018-08-23 LAB — THYROID PANEL WITH TSH
Free Thyroxine Index: 1.9 (ref 1.2–4.9)
T3 Uptake Ratio: 24 % (ref 24–39)
T4, Total: 7.8 ug/dL (ref 4.5–12.0)
TSH: 6.82 u[IU]/mL — ABNORMAL HIGH (ref 0.450–4.500)

## 2018-08-23 LAB — CBC WITH DIFFERENTIAL/PLATELET
BASOS ABS: 0.1 10*3/uL (ref 0.0–0.2)
Basos: 1 %
EOS (ABSOLUTE): 0.2 10*3/uL (ref 0.0–0.4)
Eos: 2 %
Hematocrit: 48.5 % (ref 37.5–51.0)
Hemoglobin: 16.1 g/dL (ref 13.0–17.7)
Immature Grans (Abs): 0.1 10*3/uL (ref 0.0–0.1)
Immature Granulocytes: 1 %
Lymphocytes Absolute: 3 10*3/uL (ref 0.7–3.1)
Lymphs: 32 %
MCH: 28.9 pg (ref 26.6–33.0)
MCHC: 33.2 g/dL (ref 31.5–35.7)
MCV: 87 fL (ref 79–97)
Monocytes Absolute: 0.7 10*3/uL (ref 0.1–0.9)
Monocytes: 7 %
Neutrophils Absolute: 5.3 10*3/uL (ref 1.4–7.0)
Neutrophils: 57 %
PLATELETS: 320 10*3/uL (ref 150–450)
RBC: 5.58 x10E6/uL (ref 4.14–5.80)
RDW: 12.8 % (ref 12.3–15.4)
WBC: 9.3 10*3/uL (ref 3.4–10.8)

## 2018-08-23 LAB — CMP14+EGFR
ALK PHOS: 94 IU/L (ref 39–117)
ALT: 81 IU/L — AB (ref 0–44)
AST: 53 IU/L — AB (ref 0–40)
Albumin/Globulin Ratio: 1.8 (ref 1.2–2.2)
Albumin: 4.6 g/dL (ref 3.5–5.5)
BUN/Creatinine Ratio: 11 (ref 9–20)
BUN: 14 mg/dL (ref 6–24)
Bilirubin Total: 0.6 mg/dL (ref 0.0–1.2)
CALCIUM: 9.2 mg/dL (ref 8.7–10.2)
CHLORIDE: 102 mmol/L (ref 96–106)
CO2: 19 mmol/L — ABNORMAL LOW (ref 20–29)
Creatinine, Ser: 1.24 mg/dL (ref 0.76–1.27)
GFR calc Af Amer: 77 mL/min/{1.73_m2} (ref >59)
GFR, EST NON AFRICAN AMERICAN: 67 mL/min/{1.73_m2} (ref >59)
Globulin, Total: 2.6 g/dL (ref 1.5–4.5)
Glucose: 98 mg/dL (ref 65–99)
Potassium: 4.6 mmol/L (ref 3.5–5.2)
SODIUM: 144 mmol/L (ref 134–144)
Total Protein: 7.2 g/dL (ref 6.0–8.5)

## 2018-08-23 LAB — LIPID PANEL
Chol/HDL Ratio: 7.2 ratio — ABNORMAL HIGH (ref 0.0–5.0)
Cholesterol, Total: 287 mg/dL — ABNORMAL HIGH (ref 100–199)
HDL: 40 mg/dL (ref >39)
Triglycerides: 410 mg/dL — ABNORMAL HIGH (ref 0–149)

## 2018-08-23 LAB — TESTOSTERONE,FREE AND TOTAL
TESTOSTERONE: 509 ng/dL (ref 264–916)
Testosterone, Free: 10.3 pg/mL (ref 7.2–24.0)

## 2018-08-25 MED ORDER — FENOFIBRATE 145 MG PO TABS: 145.0000 mg | ORAL_TABLET | Freq: Every day | ORAL | 5 refills | Status: DC

## 2018-08-25 NOTE — Addendum Note (Signed)
Addended by: Chevis Pretty on: 08/25/2018 01:01 PM   Modules accepted: Orders

## 2018-11-20 ENCOUNTER — Other Ambulatory Visit: Payer: Self-pay

## 2018-11-20 ENCOUNTER — Telehealth: Payer: Self-pay | Admitting: Nurse Practitioner

## 2018-11-20 DIAGNOSIS — E034 Atrophy of thyroid (acquired): Secondary | ICD-10-CM

## 2018-11-20 NOTE — Telephone Encounter (Signed)
Patient notified that its ok to come in and have labs drawn tomorrow. Orders placed

## 2018-11-20 NOTE — Telephone Encounter (Signed)
Pt has apt Monday, does he need to come in tomorrow morning and do labs, so MMM can have his thyroid back for his apt for Monday. Please leave pt a message if he doesn't answer bc he may  Not have cell service.

## 2018-11-21 ENCOUNTER — Other Ambulatory Visit: Payer: Self-pay

## 2018-11-21 ENCOUNTER — Other Ambulatory Visit: Payer: 59

## 2018-11-21 DIAGNOSIS — E034 Atrophy of thyroid (acquired): Secondary | ICD-10-CM | POA: Diagnosis not present

## 2018-11-21 DIAGNOSIS — I10 Essential (primary) hypertension: Secondary | ICD-10-CM | POA: Diagnosis not present

## 2018-11-21 DIAGNOSIS — E7800 Pure hypercholesterolemia, unspecified: Secondary | ICD-10-CM | POA: Diagnosis not present

## 2018-11-22 LAB — CMP14+EGFR
ALK PHOS: 43 IU/L (ref 39–117)
ALT: 36 IU/L (ref 0–44)
AST: 29 IU/L (ref 0–40)
Albumin/Globulin Ratio: 2 (ref 1.2–2.2)
Albumin: 4.4 g/dL (ref 3.8–4.9)
BUN / CREAT RATIO: 13 (ref 9–20)
BUN: 17 mg/dL (ref 6–24)
Bilirubin Total: 0.4 mg/dL (ref 0.0–1.2)
CO2: 25 mmol/L (ref 20–29)
Calcium: 9.1 mg/dL (ref 8.7–10.2)
Chloride: 100 mmol/L (ref 96–106)
Creatinine, Ser: 1.29 mg/dL — ABNORMAL HIGH (ref 0.76–1.27)
GFR calc Af Amer: 74 mL/min/{1.73_m2} (ref >59)
GFR calc non Af Amer: 64 mL/min/{1.73_m2} (ref >59)
Globulin, Total: 2.2 g/dL (ref 1.5–4.5)
Glucose: 91 mg/dL (ref 65–99)
Potassium: 4.7 mmol/L (ref 3.5–5.2)
Sodium: 138 mmol/L (ref 134–144)
Total Protein: 6.6 g/dL (ref 6.0–8.5)

## 2018-11-22 LAB — TESTOSTERONE,FREE AND TOTAL
TESTOSTERONE FREE: 11.4 pg/mL (ref 7.2–24.0)
Testosterone: 372 ng/dL (ref 264–916)

## 2018-11-22 LAB — CBC WITH DIFFERENTIAL/PLATELET
Basophils Absolute: 0.1 10*3/uL (ref 0.0–0.2)
Basos: 2 %
EOS (ABSOLUTE): 0.2 10*3/uL (ref 0.0–0.4)
Eos: 3 %
Hematocrit: 43.4 % (ref 37.5–51.0)
Hemoglobin: 14.9 g/dL (ref 13.0–17.7)
Immature Grans (Abs): 0 10*3/uL (ref 0.0–0.1)
Immature Granulocytes: 1 %
Lymphocytes Absolute: 3.1 10*3/uL (ref 0.7–3.1)
Lymphs: 41 %
MCH: 30.2 pg (ref 26.6–33.0)
MCHC: 34.3 g/dL (ref 31.5–35.7)
MCV: 88 fL (ref 79–97)
MONOCYTES: 8 %
Monocytes Absolute: 0.6 10*3/uL (ref 0.1–0.9)
Neutrophils Absolute: 3.5 10*3/uL (ref 1.4–7.0)
Neutrophils: 45 %
Platelets: 313 10*3/uL (ref 150–450)
RBC: 4.94 x10E6/uL (ref 4.14–5.80)
RDW: 12.5 % (ref 11.6–15.4)
WBC: 7.5 10*3/uL (ref 3.4–10.8)

## 2018-11-22 LAB — LIPID PANEL
CHOLESTEROL TOTAL: 223 mg/dL — AB (ref 100–199)
Chol/HDL Ratio: 6.2 ratio — ABNORMAL HIGH (ref 0.0–5.0)
HDL: 36 mg/dL — ABNORMAL LOW (ref >39)
LDL Calculated: 156 mg/dL — ABNORMAL HIGH (ref 0–99)
Triglycerides: 153 mg/dL — ABNORMAL HIGH (ref 0–149)
VLDL Cholesterol Cal: 31 mg/dL (ref 5–40)

## 2018-11-22 LAB — TSH: TSH: 2.67 u[IU]/mL (ref 0.450–4.500)

## 2018-11-24 ENCOUNTER — Encounter: Payer: Self-pay | Admitting: Nurse Practitioner

## 2018-11-24 ENCOUNTER — Ambulatory Visit (INDEPENDENT_AMBULATORY_CARE_PROVIDER_SITE_OTHER): Payer: 59 | Admitting: Nurse Practitioner

## 2018-11-24 ENCOUNTER — Telehealth: Payer: Self-pay

## 2018-11-24 ENCOUNTER — Other Ambulatory Visit: Payer: Self-pay

## 2018-11-24 VITALS — BP 111/75 | HR 79 | Temp 97.3°F | Ht 71.0 in | Wt 193.0 lb

## 2018-11-24 DIAGNOSIS — F411 Generalized anxiety disorder: Secondary | ICD-10-CM

## 2018-11-24 DIAGNOSIS — E782 Mixed hyperlipidemia: Secondary | ICD-10-CM | POA: Diagnosis not present

## 2018-11-24 DIAGNOSIS — M51369 Other intervertebral disc degeneration, lumbar region without mention of lumbar back pain or lower extremity pain: Secondary | ICD-10-CM

## 2018-11-24 DIAGNOSIS — F329 Major depressive disorder, single episode, unspecified: Secondary | ICD-10-CM

## 2018-11-24 DIAGNOSIS — I10 Essential (primary) hypertension: Secondary | ICD-10-CM

## 2018-11-24 DIAGNOSIS — E034 Atrophy of thyroid (acquired): Secondary | ICD-10-CM

## 2018-11-24 DIAGNOSIS — F3342 Major depressive disorder, recurrent, in full remission: Secondary | ICD-10-CM

## 2018-11-24 DIAGNOSIS — F5101 Primary insomnia: Secondary | ICD-10-CM

## 2018-11-24 DIAGNOSIS — F32A Depression, unspecified: Secondary | ICD-10-CM

## 2018-11-24 DIAGNOSIS — M5136 Other intervertebral disc degeneration, lumbar region: Secondary | ICD-10-CM

## 2018-11-24 MED ORDER — ROSUVASTATIN CALCIUM 10 MG PO TABS: 10.0000 mg | ORAL_TABLET | Freq: Every day | ORAL | 1 refills | Status: DC

## 2018-11-24 MED ORDER — ZOLPIDEM TARTRATE 10 MG PO TABS: 10.0000 mg | ORAL_TABLET | Freq: Every day | ORAL | 5 refills | Status: DC

## 2018-11-24 MED ORDER — FENOFIBRATE 145 MG PO TABS: 145.0000 mg | ORAL_TABLET | Freq: Every day | ORAL | 5 refills | Status: DC

## 2018-11-24 MED ORDER — FENOFIBRATE 160 MG PO TABS: 160.0000 mg | ORAL_TABLET | Freq: Every day | ORAL | 1 refills | Status: DC

## 2018-11-24 MED ORDER — ESCITALOPRAM OXALATE 20 MG PO TABS: 20.0000 mg | ORAL_TABLET | Freq: Every day | ORAL | 1 refills | Status: DC

## 2018-11-24 MED ORDER — LEVOTHYROXINE SODIUM 150 MCG PO TABS: 150.0000 ug | ORAL_TABLET | Freq: Every day | ORAL | 1 refills | Status: DC

## 2018-11-24 MED ORDER — LISINOPRIL-HYDROCHLOROTHIAZIDE 10-12.5 MG PO TABS: 1.0000 | ORAL_TABLET | Freq: Every day | ORAL | 1 refills | Status: DC

## 2018-11-24 MED ORDER — ALPRAZOLAM 1 MG PO TABS: 1.0000 mg | ORAL_TABLET | Freq: Two times a day (BID) | ORAL | 5 refills | Status: DC | PRN

## 2018-11-24 MED ORDER — ARIPIPRAZOLE 5 MG PO TABS: 5.0000 mg | ORAL_TABLET | Freq: Every day | ORAL | 1 refills | Status: DC

## 2018-11-24 NOTE — Progress Notes (Signed)
Subjective:    Patient ID: Kenneth [name], male    DOB: [birth year], 53 y.o.   MRN: [id number]   Chief Complaint: Medical Management of Chronic Issues   HPI:  1. Essential hypertension  No c/o chest pain, sob or headache. Does not check blood pressure at home. BP Readings from Last 3 Encounters:  11/24/18 111/75  08/22/18 130/89  02/20/18 132/88     2. mixed hypercholesterolemia  Does try to watch diet but does no exercise.  3. Hypothyroidism due to acquired atrophy of thyroid  No problems that she is aware of.  4. GAD (generalized anxiety disorder)  Is on xanax BID. 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  5. Recurrent major depressive disorder, in full remission ([place])  lexapro working well to keep her calm. Would like to add to meds due to stress he is under. He has frequent mood swings. Depression screen [facility] 2/9 11/24/2018 08/22/2018 02/20/2018  Decreased Interest 0 3 0  Down, Depressed, Hopeless 0 1 1  PHQ - 2 Score 0 4 1  Altered sleeping - 3 -  Tired, decreased energy - 3 -  Change in appetite - 0 -  Feeling bad or failure about yourself  - 1 -  Trouble concentrating - 3 -  Moving slowly or fidgety/restless - 3 -  Suicidal thoughts - 0 -  PHQ-9 Score - 17 -  Difficult doing work/chores - Very difficult -  Some recent data might be hidden     6. Primary insomnia  Takes ambien at nighttime and works well to help him sleep.  7.      DDD           He use to be on pain meds. He is currently just taking                       Neurontin. Doing well.      Outpatient Encounter Medications as of 11/24/2018  Medication Sig   ALPRAZolam (XANAX) 1 MG tablet Take 1 tablet (1 mg total) by mouth 2 (two) times daily as needed for anxiety.   escitalopram (LEXAPRO) 20 MG tablet Take 1 tablet (20 mg total) by mouth daily.   fenofibrate (TRICOR) 145 MG tablet Take 1 tablet (145 mg total) by mouth daily.   gabapentin (NEURONTIN) 600 MG tablet TAKE 1  TABLET UP TO 3 TIMES A DAY   levothyroxine (SYNTHROID) 150 MCG tablet Take 1 tablet (150 mcg total) by mouth daily.   lisinopril-hydrochlorothiazide (PRINZIDE,ZESTORETIC) 10-12.5 MG tablet Take 1 tablet by mouth daily.   PROCTOZONE-HC 2.5 % rectal cream APPLY RECTALLY 2 TIMES A DAY AS DIRECTED   zolpidem (AMBIEN) 10 MG tablet Take 1 tablet (10 mg total) by mouth at bedtime.   No facility-administered encounter medications on file as of 11/24/2018.       New complaints: None today  Social history: Self employeed- Clinical research associate  Review of Systems  Constitutional: Negative for activity change and appetite change.  HENT: Negative.   Eyes: Negative for pain.  Respiratory: Negative for shortness of breath.   Cardiovascular: Negative for chest pain, palpitations and leg swelling.  Gastrointestinal: Negative for abdominal pain.  Endocrine: Negative for polydipsia.  Genitourinary: Negative.   Skin: Negative for rash.  Neurological: Negative for dizziness, weakness and headaches.  Hematological: Does not bruise/bleed easily.  Psychiatric/Behavioral: Negative.   All other systems reviewed and are negative.  Objective:   Physical Exam Vitals signs and nursing note reviewed.  Constitutional:      Appearance: Normal appearance. He is well-developed.  HENT:     Head: Normocephalic.     Nose: Nose normal.  Eyes:     Pupils: Pupils are equal, round, and reactive to light.  Neck:     Musculoskeletal: Normal range of motion and neck supple.     Thyroid: No thyroid mass or thyromegaly.     Vascular: No carotid bruit or JVD.     Trachea: Phonation normal.  Cardiovascular:     Rate and Rhythm: Normal rate and regular rhythm.  Pulmonary:     Effort: Pulmonary effort is normal. No respiratory distress.     Breath sounds: Normal breath sounds.  Abdominal:     General: Bowel sounds are normal.     Palpations: Abdomen is soft.     Tenderness: There is no abdominal tenderness.    Musculoskeletal: Normal range of motion.  Lymphadenopathy:     Cervical: No cervical adenopathy.  Skin:    General: Skin is warm and dry.  Neurological:     Mental Status: He is alert and oriented to person, place, and time.  Psychiatric:        Behavior: Behavior normal.        Thought Content: Thought content normal.        Judgment: Judgment normal.    BP 111/75    Pulse 79    Temp (!) 97.3 F (36.3 C) (Oral)    Ht 5\' 11"  (1.803 m)    Wt 193 lb (87.5 kg)    BMI 26.92 kg/m       Assessment & Plan:  .Kenneth [name] comes in today with chief complaint of Medical Management of Chronic Issues   Diagnosis and orders addressed:  1. Essential hypertension Low sodium diet - lisinopril-hydrochlorothiazide (PRINZIDE,ZESTORETIC) 10-12.5 MG tablet; Take 1 tablet by mouth daily.  Dispense: 90 tablet; Refill: 1  2. Mixed hyperlipidemia Low fat diet - rosuvastatin (CRESTOR) 10 MG tablet; Take 1 tablet (10 mg total) by mouth daily.  Dispense: 90 tablet; Refill: 1  3. Hypothyroidism due to acquired atrophy of thyroid - levothyroxine (SYNTHROID) 150 MCG tablet; Take 1 tablet (150 mcg total) by mouth daily.  Dispense: 90 tablet; Refill: 1  4. GAD (generalized anxiety disorder) Stress management - ALPRAZolam (XANAX) 1 MG tablet; Take 1 tablet (1 mg total) by mouth 2 (two) times daily as needed for anxiety.  Dispense: 60 tablet; Refill: 5  5. Recurrent major depressive disorder, in full remission ([place]) Added abilify 5mg  daily- side effects discussed  6. Primary insomnia Bedtime routine - zolpidem (AMBIEN) 10 MG tablet; Take 1 tablet (10 mg total) by mouth at bedtime.  Dispense: 30 tablet; Refill: 5  7. DDD (degenerative disc disease), lumbar Continue neurontin as rx No heavy lifting  Good body mechanics     Labs pending Health Maintenance reviewed Diet and exercise encouraged  Follow up plan: 3 months   Mary-Margaret Hassell Done, FNP

## 2018-11-24 NOTE — Patient Instructions (Signed)

## 2018-11-24 NOTE — Telephone Encounter (Signed)
You had prescribed Fenofibrate 145 mg, his insurance will only cover Fenofibrate 160 mg.  Can you send over a new prescription to [facility].

## 2019-02-10 ENCOUNTER — Ambulatory Visit (INDEPENDENT_AMBULATORY_CARE_PROVIDER_SITE_OTHER): Payer: 59 | Admitting: Nurse Practitioner

## 2019-02-10 ENCOUNTER — Other Ambulatory Visit: Payer: Self-pay

## 2019-02-10 ENCOUNTER — Encounter: Payer: Self-pay | Admitting: Nurse Practitioner

## 2019-02-10 VITALS — BP 122/86 | HR 65 | Temp 97.3°F | Ht 71.0 in | Wt 196.0 lb

## 2019-02-10 DIAGNOSIS — K429 Umbilical hernia without obstruction or gangrene: Secondary | ICD-10-CM | POA: Diagnosis not present

## 2019-02-10 NOTE — Progress Notes (Signed)
   Subjective:    Patient ID: Kenneth [name], male    DOB: 12/27/1965, 53 y.o.   MRN: [id number]   Chief Complaint: Umbilical Hernia (x 2 weeks)   HPI Patient comes in today c/o umbilical hernia. This bopped out 2 weeks ago. He was riding boat and when he got home he noticed it. Tender to touch but does not bother him if he is not pressing on it.   Review of Systems  Constitutional: Negative for activity change and appetite change.  HENT: Negative.   Eyes: Negative for pain.  Respiratory: Negative for shortness of breath.   Cardiovascular: Negative for chest pain, palpitations and leg swelling.  Gastrointestinal: Negative for abdominal pain.  Endocrine: Negative for polydipsia.  Genitourinary: Negative.   Skin: Negative for rash.  Neurological: Negative for dizziness, weakness and headaches.  Hematological: Does not bruise/bleed easily.  Psychiatric/Behavioral: Negative.   All other systems reviewed and are negative.      Objective:   Physical Exam Vitals signs and nursing note reviewed.  Cardiovascular:     Rate and Rhythm: Normal rate and regular rhythm.     Pulses: Normal pulses.     Heart sounds: Normal heart sounds.  Pulmonary:     Breath sounds: Normal breath sounds.  Abdominal:     General: Abdomen is flat.     Palpations: Abdomen is soft.     Comments: Small umbilical hernia- reducible  Skin:    General: Skin is warm and dry.  Neurological:     General: No focal deficit present.     Mental Status: He is oriented to person, place, and time.  Psychiatric:        Mood and Affect: Mood normal.        Behavior: Behavior normal.    BP 122/86   Pulse 65   Temp (!) 97.3 F (36.3 C) (Oral)   Ht 5\' 11"  (1.803 m)   Wt 196 lb (88.9 kg)   BMI 27.34 kg/m         Assessment & Plan:  Glade Stanford in today with chief complaint of Umbilical Hernia (x 2 weeks)   1. Umbilical hernia without obstruction and without gangrene Watch avoid real heavy lifting  If develops pain or becomes hard and will not reduce then need to see surgeon  Mary-Margaret Hassell Done, FNP

## 2019-02-10 NOTE — Patient Instructions (Signed)
Umbilical Hernia, Adult  A hernia is a bulge of tissue that pushes through an opening between muscles. An umbilical hernia happens in the abdomen, near the belly button (umbilicus). The hernia may contain tissues from the small intestine, large intestine, or fatty tissue covering the intestines (omentum). Umbilical hernias in adults tend to get worse over time, and they require surgical treatment. There are several types of umbilical hernias. You may have:  A hernia located just above or below the umbilicus (indirect hernia). This is the most common type of umbilical hernia in adults.  A hernia that forms through an opening formed by the umbilicus (direct hernia).  A hernia that comes and goes (reducible hernia). A reducible hernia may be visible only when you strain, lift something heavy, or cough. This type of hernia can be pushed back into the abdomen (reduced).  A hernia that traps abdominal tissue inside the hernia (incarcerated hernia). This type of hernia cannot be reduced.  A hernia that cuts off blood flow to the tissues inside the hernia (strangulated hernia). The tissues can start to die if this happens. This type of hernia requires emergency treatment. What are the causes? An umbilical hernia happens when tissue inside the abdomen presses on a weak area of the abdominal muscles. What increases the risk? You may have a greater risk of this condition if you:  Are obese.  Have had several pregnancies.  Have a buildup of fluid inside your abdomen (ascites).  Have had surgery that weakens the abdominal muscles. What are the signs or symptoms? The main symptom of this condition is a painless bulge at or near the belly button. A reducible hernia may be visible only when you strain, lift something heavy, or cough. Other symptoms may include:  Dull pain.  A feeling of pressure. Symptoms of a strangulated hernia may include:  Pain that gets increasingly worse.  Nausea and  vomiting.  Pain when pressing on the hernia.  Skin over the hernia becoming red or purple.  Constipation.  Blood in the stool. How is this diagnosed? This condition may be diagnosed based on:  A physical exam. You may be asked to cough or strain while standing. These actions increase the pressure inside your abdomen and force the hernia through the opening in your muscles. Your health care provider may try to reduce the hernia by pressing on it.  Your symptoms and medical history. How is this treated? Surgery is the only treatment for an umbilical hernia. Surgery for a strangulated hernia is done as soon as possible. If you have a small hernia that is not incarcerated, you may need to lose weight before having surgery. Follow these instructions at home:  Lose weight, if told by your health care provider.  Do not try to push the hernia back in.  Watch your hernia for any changes in color or size. Tell your health care provider if any changes occur.  You may need to avoid activities that increase pressure on your hernia.  Do not lift anything that is heavier than 10 lb (4.5 kg) until your health care provider says that this is safe.  Take over-the-counter and prescription medicines only as told by your health care provider.  Keep all follow-up visits as told by your health care provider. This is important. Contact a health care provider if:  Your hernia gets larger.  Your hernia becomes painful. Get help right away if:  You develop sudden, severe pain near the area of your hernia.    You have pain as well as nausea or vomiting.  You have pain and the skin over your hernia changes color.  You develop a fever. This information is not intended to replace advice given to you by your health care provider. Make sure you discuss any questions you have with your health care provider. Document Released: 01/27/2016 Document Revised: 10/09/2017 Document Reviewed: 02/25/2017 Elsevier  Interactive Patient Education  2019 [employer] Inc[employer]  

## 2019-03-02 ENCOUNTER — Ambulatory Visit (INDEPENDENT_AMBULATORY_CARE_PROVIDER_SITE_OTHER): Payer: 59 | Admitting: Nurse Practitioner

## 2019-03-02 ENCOUNTER — Other Ambulatory Visit: Payer: Self-pay

## 2019-03-02 ENCOUNTER — Encounter: Payer: Self-pay | Admitting: Nurse Practitioner

## 2019-03-02 VITALS — BP 133/84 | HR 85 | Temp 98.0°F | Ht 71.0 in | Wt 197.0 lb

## 2019-03-02 DIAGNOSIS — I10 Essential (primary) hypertension: Secondary | ICD-10-CM | POA: Diagnosis not present

## 2019-03-02 DIAGNOSIS — F32A Depression, unspecified: Secondary | ICD-10-CM

## 2019-03-02 DIAGNOSIS — E782 Mixed hyperlipidemia: Secondary | ICD-10-CM

## 2019-03-02 DIAGNOSIS — F329 Major depressive disorder, single episode, unspecified: Secondary | ICD-10-CM

## 2019-03-02 DIAGNOSIS — E034 Atrophy of thyroid (acquired): Secondary | ICD-10-CM

## 2019-03-02 DIAGNOSIS — F3342 Major depressive disorder, recurrent, in full remission: Secondary | ICD-10-CM

## 2019-03-02 DIAGNOSIS — F5101 Primary insomnia: Secondary | ICD-10-CM

## 2019-03-02 DIAGNOSIS — M5136 Other intervertebral disc degeneration, lumbar region: Secondary | ICD-10-CM

## 2019-03-02 DIAGNOSIS — F411 Generalized anxiety disorder: Secondary | ICD-10-CM

## 2019-03-02 NOTE — Progress Notes (Signed)
Subjective:    Patient ID: Kenneth [name], male    DOB: 1966/07/09, 53 y.o.   MRN: [id number]   Chief Complaint: medical management of chronic issues   HPI:  1. Essential hypertension No c/o chest pain, sob or headache. Does not check blood pressure at home. BP Readings from Last 3 Encounters:  02/10/19 122/86  11/24/18 111/75  08/22/18 130/89     2. Mixed hyperlipidemia Tries to watch diet. Does very little exercise  3. Hypothyroidism due to acquired atrophy of thyroid No problems that he is aware of  4. Primary insomnia Is on ambien nightly to sleep. Sleeps well most nights  5. GAD (generalized anxiety disorder) Is on xanax BID and is doing well. Helps to keep him calm  6. Recurrent major depressive disorder, in full remission ([place]) Is currently on abilify. He says this really helps with mood swings. We started abilify at last visit and at first it gave him energy but now iis back to feeling fatigue. Depression screen [facility] 2/9 03/02/2019 02/10/2019 11/24/2018  Decreased Interest 0 0 0  Down, Depressed, Hopeless 0 0 0  PHQ - 2 Score 0 0 0  Altered sleeping - - -  Tired, decreased energy - - -  Change in appetite - - -  Feeling bad or failure about yourself  - - -  Trouble concentrating - - -  Moving slowly or fidgety/restless - - -  Suicidal thoughts - - -  PHQ-9 Score - - -  Difficult doing work/chores - - -  Some recent data might be hidden     7. DDD (degenerative disc disease), lumbar Still has back pain but is tolerable. Is currently on no pain meds. Rates pain 2-8/10 depending on how active he is. Just using ibuprofen as needed    Outpatient Encounter Medications as of 03/02/2019  Medication Sig  . ALPRAZolam (XANAX) 1 MG tablet Take 1 tablet (1 mg total) by mouth 2 (two) times daily as needed for anxiety.  . ARIPiprazole (ABILIFY) 5 MG tablet Take 1 tablet (5 mg total) by mouth daily.  Marland Kitchen escitalopram (LEXAPRO) 20 MG tablet Take 1 tablet (20 mg total) by  mouth daily.  . fenofibrate 160 MG tablet Take 1 tablet (160 mg total) by mouth daily.  Marland Kitchen gabapentin (NEURONTIN) 600 MG tablet TAKE 1 TABLET UP TO 3 TIMES A DAY  . levothyroxine (SYNTHROID) 150 MCG tablet Take 1 tablet (150 mcg total) by mouth daily.  Marland Kitchen lisinopril-hydrochlorothiazide (PRINZIDE,ZESTORETIC) 10-12.5 MG tablet Take 1 tablet by mouth daily.  Marland Kitchen PROCTOZONE-HC 2.5 % rectal cream APPLY RECTALLY 2 TIMES A DAY AS DIRECTED  . rosuvastatin (CRESTOR) 10 MG tablet Take 1 tablet (10 mg total) by mouth daily.  Marland Kitchen zolpidem (AMBIEN) 10 MG tablet Take 1 tablet (10 mg total) by mouth at bedtime.     Past Surgical History:  Procedure Laterality Date  . BACK SURGERY     disectomy  . THYROIDECTOMY  2010    Family History  Problem Relation Age of Onset  . Diabetes Mother   . Hypertension Mother   . Stroke Mother   . Hyperlipidemia Father   . Hypertension Father   . Heart attack Father   . Diabetes Father     New complaints: None today  Social history: Lives with wife. His oldest son has  Moved back in with him     Review of Systems  Constitutional: Negative for activity change and appetite change.  HENT: Negative.  Eyes: Negative for pain.  Respiratory: Negative for shortness of breath.   Cardiovascular: Negative for chest pain, palpitations and leg swelling.  Gastrointestinal: Negative for abdominal pain.  Endocrine: Negative for polydipsia.  Genitourinary: Negative.   Musculoskeletal: Positive for back pain (chronic).  Skin: Negative for rash.  Neurological: Negative for dizziness, weakness and headaches.  Hematological: Does not bruise/bleed easily.  Psychiatric/Behavioral: Negative.   All other systems reviewed and are negative.      Objective:   Physical Exam Vitals signs and nursing note reviewed.  Constitutional:      Appearance: Normal appearance. He is well-developed.  HENT:     Head: Normocephalic.     Nose: Nose normal.  Eyes:     Pupils: Pupils are  equal, round, and reactive to light.  Neck:     Musculoskeletal: Normal range of motion and neck supple.     Thyroid: No thyroid mass or thyromegaly.     Vascular: No carotid bruit or JVD.     Trachea: Phonation normal.  Cardiovascular:     Rate and Rhythm: Normal rate and regular rhythm.  Pulmonary:     Effort: Pulmonary effort is normal. No respiratory distress.     Breath sounds: Normal breath sounds.  Abdominal:     General: Bowel sounds are normal.     Palpations: Abdomen is soft.     Tenderness: There is no abdominal tenderness.  Musculoskeletal: Normal range of motion.     Comments: FROM of lumbar spine with pain on flexion and extension. (-) SLR bil  Lymphadenopathy:     Cervical: No cervical adenopathy.  Skin:    General: Skin is warm and dry.  Neurological:     Mental Status: He is alert and oriented to person, place, and time.  Psychiatric:        Behavior: Behavior normal.        Thought Content: Thought content normal.        Judgment: Judgment normal.   BP 133/84   Pulse 85   Temp 98 F (36.7 C) (Oral)   Ht _0  (1.803 m)   Wt 197 lb (89.4 kg)   BMI 27.48 kg/m       Assessment & Plan:  Kenneth [name] comes in today with chief complaint of Medical Management of Chronic Issues   Diagnosis and orders addressed:  1. Essential hypertension Low sodium diet - CMP14+EGFR  2. Mixed hyperlipidemia Low fat diet - Lipid panel  3. Hypothyroidism due to acquired atrophy of thyroid - Thyroid Panel With TSH  4. Primary insomnia Bedtime routine  5. GAD (generalized anxiety disorder) Stress amanagement  6. Recurrent major depressive disorder, in full remission ([place])  7. DDD (degenerative disc disease), lumbar Back stretches reviewed    Labs pending Health Maintenance reviewed Diet and exercise encouraged  Follow up plan: 6 months    [place], FNP

## 2019-03-02 NOTE — Patient Instructions (Signed)

## 2019-03-04 ENCOUNTER — Other Ambulatory Visit: Payer: Self-pay

## 2019-03-04 ENCOUNTER — Other Ambulatory Visit: Payer: 59

## 2019-03-05 LAB — CMP14+EGFR
ALT: 51 IU/L — ABNORMAL HIGH (ref 0–44)
AST: 41 IU/L — ABNORMAL HIGH (ref 0–40)
Albumin/Globulin Ratio: 2 (ref 1.2–2.2)
Albumin: 4.6 g/dL (ref 3.8–4.9)
Alkaline Phosphatase: 47 IU/L (ref 39–117)
BUN/Creatinine Ratio: 11 (ref 9–20)
BUN: 18 mg/dL (ref 6–24)
Bilirubin Total: 0.4 mg/dL (ref 0.0–1.2)
CO2: 22 mmol/L (ref 20–29)
Calcium: 9.3 mg/dL (ref 8.7–10.2)
Chloride: 102 mmol/L (ref 96–106)
Creatinine, Ser: 1.6 mg/dL — ABNORMAL HIGH (ref 0.76–1.27)
GFR calc Af Amer: 57 mL/min/{1.73_m2} — ABNORMAL LOW (ref >59)
GFR calc non Af Amer: 49 mL/min/{1.73_m2} — ABNORMAL LOW (ref >59)
Globulin, Total: 2.3 g/dL (ref 1.5–4.5)
Glucose: 93 mg/dL (ref 65–99)
Potassium: 4.6 mmol/L (ref 3.5–5.2)
Sodium: 144 mmol/L (ref 134–144)
Total Protein: 6.9 g/dL (ref 6.0–8.5)

## 2019-03-05 LAB — LIPID PANEL
Chol/HDL Ratio: 5.2 ratio — ABNORMAL HIGH (ref 0.0–5.0)
Cholesterol, Total: 196 mg/dL (ref 100–199)
HDL: 38 mg/dL — ABNORMAL LOW (ref >39)
LDL Calculated: 129 mg/dL — ABNORMAL HIGH (ref 0–99)
Triglycerides: 144 mg/dL (ref 0–149)
VLDL Cholesterol Cal: 29 mg/dL (ref 5–40)

## 2019-03-05 LAB — THYROID PANEL WITH TSH
Free Thyroxine Index: 2.4 (ref 1.2–4.9)
T3 Uptake Ratio: 27 % (ref 24–39)
T4, Total: 8.8 ug/dL (ref 4.5–12.0)
TSH: 4.46 u[IU]/mL (ref 0.450–4.500)

## 2019-04-17 ENCOUNTER — Other Ambulatory Visit: Payer: Self-pay | Admitting: Nurse Practitioner

## 2019-06-16 ENCOUNTER — Other Ambulatory Visit: Payer: Self-pay | Admitting: Nurse Practitioner

## 2019-06-16 DIAGNOSIS — F411 Generalized anxiety disorder: Secondary | ICD-10-CM

## 2019-06-16 DIAGNOSIS — F5101 Primary insomnia: Secondary | ICD-10-CM

## 2019-07-21 ENCOUNTER — Other Ambulatory Visit: Payer: Self-pay | Admitting: Nurse Practitioner

## 2019-07-21 DIAGNOSIS — F411 Generalized anxiety disorder: Secondary | ICD-10-CM

## 2019-07-21 DIAGNOSIS — E782 Mixed hyperlipidemia: Secondary | ICD-10-CM

## 2019-07-21 DIAGNOSIS — E034 Atrophy of thyroid (acquired): Secondary | ICD-10-CM

## 2019-07-21 DIAGNOSIS — F5101 Primary insomnia: Secondary | ICD-10-CM

## 2019-07-22 ENCOUNTER — Other Ambulatory Visit: Payer: Self-pay | Admitting: Nurse Practitioner

## 2019-07-22 DIAGNOSIS — E782 Mixed hyperlipidemia: Secondary | ICD-10-CM

## 2019-07-27 ENCOUNTER — Encounter: Payer: Self-pay | Admitting: Nurse Practitioner

## 2019-07-27 ENCOUNTER — Ambulatory Visit (INDEPENDENT_AMBULATORY_CARE_PROVIDER_SITE_OTHER): Payer: 59 | Admitting: Nurse Practitioner

## 2019-07-27 DIAGNOSIS — E782 Mixed hyperlipidemia: Secondary | ICD-10-CM

## 2019-07-27 DIAGNOSIS — F329 Major depressive disorder, single episode, unspecified: Secondary | ICD-10-CM

## 2019-07-27 DIAGNOSIS — E034 Atrophy of thyroid (acquired): Secondary | ICD-10-CM

## 2019-07-27 DIAGNOSIS — F411 Generalized anxiety disorder: Secondary | ICD-10-CM

## 2019-07-27 DIAGNOSIS — F5101 Primary insomnia: Secondary | ICD-10-CM

## 2019-07-27 DIAGNOSIS — I10 Essential (primary) hypertension: Secondary | ICD-10-CM

## 2019-07-27 DIAGNOSIS — M5136 Other intervertebral disc degeneration, lumbar region: Secondary | ICD-10-CM

## 2019-07-27 DIAGNOSIS — F3342 Major depressive disorder, recurrent, in full remission: Secondary | ICD-10-CM

## 2019-07-27 DIAGNOSIS — F32A Depression, unspecified: Secondary | ICD-10-CM

## 2019-07-27 MED ORDER — ALPRAZOLAM 1 MG PO TABS: 1.0000 mg | ORAL_TABLET | Freq: Two times a day (BID) | ORAL | 3 refills | Status: DC | PRN

## 2019-07-27 MED ORDER — ROSUVASTATIN CALCIUM 10 MG PO TABS: 10.0000 mg | ORAL_TABLET | Freq: Every day | ORAL | 1 refills | Status: DC

## 2019-07-27 MED ORDER — ESCITALOPRAM OXALATE 20 MG PO TABS: 20.0000 mg | ORAL_TABLET | Freq: Every day | ORAL | 1 refills | Status: DC

## 2019-07-27 MED ORDER — LISINOPRIL-HYDROCHLOROTHIAZIDE 10-12.5 MG PO TABS: 1.0000 | ORAL_TABLET | Freq: Every day | ORAL | 1 refills | Status: DC

## 2019-07-27 MED ORDER — FENOFIBRATE 160 MG PO TABS: 160.0000 mg | ORAL_TABLET | Freq: Every day | ORAL | 1 refills | Status: DC

## 2019-07-27 MED ORDER — ZOLPIDEM TARTRATE 10 MG PO TABS: 10.0000 mg | ORAL_TABLET | Freq: Every day | ORAL | 3 refills | Status: DC

## 2019-07-27 MED ORDER — LEVOTHYROXINE SODIUM 150 MCG PO TABS: 150.0000 ug | ORAL_TABLET | Freq: Every day | ORAL | 1 refills | Status: DC

## 2019-07-27 NOTE — Progress Notes (Signed)
Virtual Visit via telephone Note Due to COVID-19 pandemic this visit was conducted virtually. This visit type was conducted due to national recommendations for restrictions regarding the COVID-19 Pandemic (e.g. social distancing, sheltering in place) in an effort to limit this patient's exposure and mitigate transmission in our community. All issues noted in this document were discussed and addressed.  A physical exam was not performed with this format.  I connected with Kenneth [name] on 07/27/19 at 11:25 by telephone and verified that I am speaking with the correct person using two identifiers. Kenneth [name] is currently located at home and no one is currently with  him during visit. The provider, Mary-Margaret Hassell Done, FNP is located in their office at time of visit.  I discussed the limitations, risks, security and privacy concerns of performing an evaluation and management service by telephone and the availability of in person appointments. I also discussed with the patient that there may be a patient responsible charge related to this service. The patient expressed understanding and agreed to proceed.   History and Present Illness:   Chief Complaint: Medical Management of Chronic Issues    HPI:  1. Essential hypertension No c/o chest pain, sob or headaches. Does not check blood pressure at home. BP Readings from Last 3 Encounters:  03/02/19 133/84  02/10/19 122/86  11/24/18 111/75     2. Mixed hyperlipidemia Does not really watch diet very closely. He does no dedicated exercise, but doe stya very active. Lab Results  Component Value Date   CHOL 196 03/04/2019   HDL 38 (L) 03/04/2019   LDLCALC 129 (H) 03/04/2019   TRIG 144 03/04/2019   CHOLHDL 5.2 (H) 03/04/2019     3. Hypothyroidism due to acquired atrophy of thyroid No problems that he is aware of. Lab Results  Component Value Date   TSH 4.460 03/04/2019     4. Primary insomnia Takes ambien to sleep at  night.  5. GAD (generalized anxiety disorder) Takes xanax BID, says he is doing well. GAD 7 : Generalized Anxiety Score 07/27/2019 08/22/2018 08/24/2016  Nervous, Anxious, on Edge 1 1 1   Control/stop worrying 1 3 3   Worry too much - different things 1 3 3   Trouble relaxing 0 3 3  Restless 0 0 0  Easily annoyed or irritable 1 1 0  Afraid - awful might happen 0 3 1  Total GAD 7 Score 4 14 11   Anxiety Difficulty Somewhat difficult - Not difficult at all      6. Recurrent major depressive disorder, in full remission ([place]) Is on lexapro. He has stopped his abilify because he dd not see where it helped. Depression screen [facility] 2/9 07/27/2019 03/02/2019 02/10/2019  Decreased Interest 1 0 0  Down, Depressed, Hopeless 1 0 0  PHQ - 2 Score 2 0 0  Altered sleeping 0 - -  Tired, decreased energy 0 - -  Change in appetite 0 - -  Feeling bad or failure about yourself  0 - -  Trouble concentrating 0 - -  Moving slowly or fidgety/restless 0 - -  Suicidal thoughts 0 - -  PHQ-9 Score 2 - -  Difficult doing work/chores Not difficult at all - -  Some recent data might be hidden     7. DDD (degenerative disc disease), lumbar Has chronic back pain dialy but has been tolerable. He has not been taking neurontin.    Outpatient Encounter Medications as of 07/27/2019  Medication Sig  . ALPRAZolam Duanne Moron)  1 MG tablet Take 1 tablet (1 mg total) by mouth 2 (two) times daily as needed for anxiety.  . ARIPiprazole (ABILIFY) 5 MG tablet Take 1 tablet (5 mg total) by mouth daily.  Marland Kitchen escitalopram (LEXAPRO) 20 MG tablet Take 1 tablet (20 mg total) by mouth daily.  . fenofibrate 160 MG tablet Take 1 tablet (160 mg total) by mouth daily.  Marland Kitchen gabapentin (NEURONTIN) 600 MG tablet TAKE 1 TABLET UP TO 3 TIMES A DAY  . levothyroxine (SYNTHROID) 150 MCG tablet Take 1 tablet (150 mcg total) by mouth daily.  Marland Kitchen lisinopril-hydrochlorothiazide (PRINZIDE,ZESTORETIC) 10-12.5 MG tablet Take 1 tablet by mouth daily.  Marland Kitchen  PROCTOZONE-HC 2.5 % rectal cream APPLY RECTALLY 2 TIMES A DAY AS DIRECTED  . rosuvastatin (CRESTOR) 10 MG tablet Take 1 tablet (10 mg total) by mouth daily. Needs to be seen for further refills.  Marland Kitchen zolpidem (AMBIEN) 10 MG tablet Take 1 tablet (10 mg total) by mouth at bedtime.    Past Surgical History:  Procedure Laterality Date  . BACK SURGERY     disectomy  . THYROIDECTOMY  2010    Family History  Problem Relation Age of Onset  . Diabetes Mother   . Hypertension Mother   . Stroke Mother   . Hyperlipidemia Father   . Hypertension Father   . Heart attack Father   . Diabetes Father     New complaints: None today  Social history: Lives with his wife and 2 sons  Controlled substance contract: will have signed at next face to face visit     Review of Systems  Constitutional: Negative for diaphoresis and weight loss.  Eyes: Negative for blurred vision, double vision and pain.  Respiratory: Negative for shortness of breath.   Cardiovascular: Negative for chest pain, palpitations, orthopnea and leg swelling.  Gastrointestinal: Negative for abdominal pain.  Musculoskeletal: Positive for back pain.  Skin: Negative for rash.  Neurological: Negative for dizziness, sensory change, loss of consciousness, weakness and headaches.  Endo/Heme/Allergies: Negative for polydipsia. Does not bruise/bleed easily.  Psychiatric/Behavioral: Negative for memory loss. The patient does not have insomnia.   All other systems reviewed and are negative.    Observations/Objective: Alert and oriented- answers all questions appropriately No distress    Assessment and Plan: Kenneth [name] comes in today with chief complaint of Medical Management of Chronic Issues   Diagnosis and orders addressed:  1. Essential hypertension Low sodium diet - lisinopril-hydrochlorothiazide (ZESTORETIC) 10-12.5 MG tablet; Take 1 tablet by mouth daily.  Dispense: 90 tablet; Refill: 1  2. Mixed  hyperlipidemia Low fat diet - rosuvastatin (CRESTOR) 10 MG tablet; Take 1 tablet (10 mg total) by mouth daily. Needs to be seen for further refills.  Dispense: 90 tablet; Refill: 1  3. Hypothyroidism due to acquired atrophy of thyroid  - levothyroxine (SYNTHROID) 150 MCG tablet; Take 1 tablet (150 mcg total) by mouth daily.  Dispense: 90 tablet; Refill: 1  4. Primary insomnia Bedtime routine - zolpidem (AMBIEN) 10 MG tablet; Take 1 tablet (10 mg total) by mouth at bedtime.  Dispense: 30 tablet; Refill: 3  5. GAD (generalized anxiety disorder) - ALPRAZolam (XANAX) 1 MG tablet; Take 1 tablet (1 mg total) by mouth 2 (two) times daily as needed for anxiety.  Dispense: 60 tablet; Refill: 3  6. Recurrent major depressive disorder, in full remission ([place]) Stress management  escitalopram (LEXAPRO) 20 MG tablet; Take 1 tablet (20 mg total) by mouth daily.  Dispense: 90 tablet; Refill: 1  7. DDD (degenerative disc disease), lumbar Back stretches Motrin asneeded   Previous labs reviewed Health Maintenance reviewed Diet and exercise encouraged  Follow up plan: 3 months      I discussed the assessment and treatment plan with the patient. The patient was provided an opportunity to ask questions and all were answered. The patient agreed with the plan and demonstrated an understanding of the instructions.   The patient was advised to call back or seek an in-person evaluation if the symptoms worsen or if the condition fails to improve as anticipated.  The above assessment and management plan was discussed with the patient. The patient verbalized understanding of and has agreed to the management plan. Patient is aware to call the clinic if symptoms persist or worsen. Patient is aware when to return to the clinic for a follow-up visit. Patient educated on when it is appropriate to go to the emergency department.   Time call ended:  11:40  I provided 15 minutes of non-face-to-face time during  this encounter.    Mary-Margaret Hassell Done, FNP

## 2019-09-03 ENCOUNTER — Ambulatory Visit: Payer: Self-pay | Admitting: Nurse Practitioner

## 2019-09-07 ENCOUNTER — Ambulatory Visit: Payer: Self-pay | Admitting: Nurse Practitioner

## 2019-10-27 ENCOUNTER — Encounter: Payer: Self-pay | Admitting: Nurse Practitioner

## 2019-10-27 ENCOUNTER — Other Ambulatory Visit: Payer: Self-pay

## 2019-10-27 ENCOUNTER — Ambulatory Visit (INDEPENDENT_AMBULATORY_CARE_PROVIDER_SITE_OTHER): Payer: 59 | Admitting: Nurse Practitioner

## 2019-10-27 VITALS — BP 123/84 | HR 68 | Temp 97.5°F | Ht 71.0 in | Wt 180.0 lb

## 2019-10-27 DIAGNOSIS — M5136 Other intervertebral disc degeneration, lumbar region: Secondary | ICD-10-CM

## 2019-10-27 DIAGNOSIS — F411 Generalized anxiety disorder: Secondary | ICD-10-CM

## 2019-10-27 DIAGNOSIS — F32A Depression, unspecified: Secondary | ICD-10-CM

## 2019-10-27 DIAGNOSIS — I10 Essential (primary) hypertension: Secondary | ICD-10-CM

## 2019-10-27 DIAGNOSIS — F329 Major depressive disorder, single episode, unspecified: Secondary | ICD-10-CM

## 2019-10-27 DIAGNOSIS — F5101 Primary insomnia: Secondary | ICD-10-CM | POA: Diagnosis not present

## 2019-10-27 DIAGNOSIS — F3342 Major depressive disorder, recurrent, in full remission: Secondary | ICD-10-CM

## 2019-10-27 DIAGNOSIS — E782 Mixed hyperlipidemia: Secondary | ICD-10-CM

## 2019-10-27 DIAGNOSIS — E034 Atrophy of thyroid (acquired): Secondary | ICD-10-CM

## 2019-10-27 DIAGNOSIS — Z125 Encounter for screening for malignant neoplasm of prostate: Secondary | ICD-10-CM

## 2019-10-27 MED ORDER — ZOLPIDEM TARTRATE 10 MG PO TABS: 10.0000 mg | ORAL_TABLET | Freq: Every day | ORAL | 3 refills | Status: DC

## 2019-10-27 MED ORDER — LEVOTHYROXINE SODIUM 150 MCG PO TABS: 150.0000 ug | ORAL_TABLET | Freq: Every day | ORAL | 1 refills | Status: DC

## 2019-10-27 MED ORDER — ROSUVASTATIN CALCIUM 10 MG PO TABS: 10.0000 mg | ORAL_TABLET | Freq: Every day | ORAL | 1 refills | Status: DC

## 2019-10-27 MED ORDER — ESCITALOPRAM OXALATE 20 MG PO TABS: 20.0000 mg | ORAL_TABLET | Freq: Every day | ORAL | 1 refills | Status: DC

## 2019-10-27 MED ORDER — LISINOPRIL-HYDROCHLOROTHIAZIDE 10-12.5 MG PO TABS: 1.0000 | ORAL_TABLET | Freq: Every day | ORAL | 1 refills | Status: DC

## 2019-10-27 MED ORDER — ALPRAZOLAM 1 MG PO TABS: 1.0000 mg | ORAL_TABLET | Freq: Two times a day (BID) | ORAL | 3 refills | Status: DC | PRN

## 2019-10-27 NOTE — Progress Notes (Signed)
Subjective:    Patient ID: Kenneth [name], male    DOB: 1965/12/05, 54 y.o.   MRN: [id number]   Chief Complaint: Medical Management of Chronic Issues    HPI:  1. Essential hypertension No c/o chest pain, sob or headache. Does not check blood pressure at home. BP Readings from Last 3 Encounters:  03/02/19 133/84  02/10/19 122/86  11/24/18 111/75     2. Mixed hyperlipidemia Does not watch diet and does very little exercise. Lab Results  Component Value Date   CHOL 196 03/04/2019   HDL 38 (L) 03/04/2019   LDLCALC 129 (H) 03/04/2019   TRIG 144 03/04/2019   CHOLHDL 5.2 (H) 03/04/2019     3. Hypothyroidism due to acquired atrophy of thyroid Not having any problems that aware of. Lab Results  Component Value Date   TSH 4.460 03/04/2019     4. Primary insomnia Is on ambien nightly to sleep. Has no problems sleeping.  5. Recurrent major depressive disorder, in full remission ([place]) Takes lexapro daily andis doing well. Depression screen [facility] 2/9 10/27/2019 07/27/2019 03/02/2019  Decreased Interest 0 1 0  Down, Depressed, Hopeless 0 1 0  PHQ - 2 Score 0 2 0  Altered sleeping 0 0 -  Tired, decreased energy 0 0 -  Change in appetite 0 0 -  Feeling bad or failure about yourself  0 0 -  Trouble concentrating 0 0 -  Moving slowly or fidgety/restless 0 0 -  Suicidal thoughts 0 0 -  PHQ-9 Score 0 2 -  Difficult doing work/chores - Not difficult at all -  Some recent data might be hidden     6. GAD (generalized anxiety disorder) Takes xanax at least 1x a day and sometimes 2x a day. He says he is doing well. GAD 7 : Generalized Anxiety Score 10/27/2019 07/27/2019 08/22/2018 08/24/2016  Nervous, Anxious, on Edge '3 1 1 1  ' Control/stop worrying '3 1 3 3  ' Worry too much - different things '3 1 3 3  ' Trouble relaxing 1 0 3 3  Restless 0 0 0 0  Easily annoyed or irritable 0 1 1 0  Afraid - awful might happen 3 0 3 1  Total GAD 7 Score '13 4 14 11  ' Anxiety Difficulty Very  difficult Somewhat difficult - Not difficult at all      7. DDD (degenerative disc disease), lumbar Has back pain daily but is no longer on pain medication. Tylenol and ibuprofen help.    Outpatient Encounter Medications as of 10/27/2019  Medication Sig  . ALPRAZolam (XANAX) 1 MG tablet Take 1 tablet (1 mg total) by mouth 2 (two) times daily as needed for anxiety.  Marland Kitchen escitalopram (LEXAPRO) 20 MG tablet Take 1 tablet (20 mg total) by mouth daily.  . fenofibrate 160 MG tablet Take 1 tablet (160 mg total) by mouth daily.  Marland Kitchen levothyroxine (SYNTHROID) 150 MCG tablet Take 1 tablet (150 mcg total) by mouth daily.  Marland Kitchen lisinopril-hydrochlorothiazide (ZESTORETIC) 10-12.5 MG tablet Take 1 tablet by mouth daily.  . rosuvastatin (CRESTOR) 10 MG tablet Take 1 tablet (10 mg total) by mouth daily. Needs to be seen for further refills.  Marland Kitchen zolpidem (AMBIEN) 10 MG tablet Take 1 tablet (10 mg total) by mouth at bedtime.  Marland Kitchen PROCTOZONE-HC 2.5 % rectal cream APPLY RECTALLY 2 TIMES A DAY AS DIRECTED     Past Surgical History:  Procedure Laterality Date  . BACK SURGERY     disectomy  . THYROIDECTOMY  2010    Family History  Problem Relation Age of Onset  . Diabetes Mother   . Hypertension Mother   . Stroke Mother   . Hyperlipidemia Father   . Hypertension Father   . Heart attack Father   . Diabetes Father     New complaints: Seems  To have some hearing loss in right ear.  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  Eyes: Negative for pain.  Respiratory: Negative for shortness of breath.   Cardiovascular: Negative for chest pain, palpitations and leg swelling.  Gastrointestinal: Negative for abdominal pain.  Endocrine: Negative for polydipsia.  Skin: Negative for rash.  Neurological: Negative for dizziness, weakness and headaches.  Hematological: Does not  bruise/bleed easily.  All other systems reviewed and are negative.      Objective:   Physical Exam Vitals and nursing note reviewed.  Constitutional:      Appearance: He is well-developed.  HENT:     Head: Normocephalic.     Right Ear: External ear normal.     Left Ear: External ear normal.     Nose: Nose normal.  Eyes:     Pupils: Pupils are equal, round, and reactive to light.  Neck:     Thyroid: No thyromegaly.     Vascular: No JVD.  Cardiovascular:     Rate and Rhythm: Normal rate and regular rhythm.     Heart sounds: Normal heart sounds. No murmur. No friction rub. No gallop.   Pulmonary:     Effort: Pulmonary effort is normal. No respiratory distress.     Breath sounds: Normal breath sounds. No wheezing or rales.  Chest:     Chest wall: No tenderness.  Abdominal:     General: Bowel sounds are normal.     Palpations: Abdomen is soft. There is no mass.     Tenderness: There is no abdominal tenderness.  Genitourinary:    Penis: Normal.      Prostate: Normal.  Musculoskeletal:        General: Normal range of motion.     Cervical back: Normal range of motion and neck supple.  Lymphadenopathy:     Cervical: No cervical adenopathy.  Skin:    General: Skin is warm and dry.  Neurological:     Mental Status: He is alert and oriented to person, place, and time.     Cranial Nerves: No cranial nerve deficit.  Psychiatric:        Behavior: Behavior normal.        Thought Content: Thought content normal.        Judgment: Judgment normal.    BP 123/84   Pulse 68   Temp (!) 97.5 F (36.4 C) (Temporal)   Ht '5\' 11"'  (1.803 m)   Wt 180 lb (81.6 kg)   SpO2 98%   BMI 25.10 kg/m         Assessment & Plan:  Kenneth [name] comes in today with chief complaint of Medical Management of Chronic Issues   Diagnosis and orders addressed:  1. Essential hypertension Low sodium diet - lisinopril-hydrochlorothiazide (ZESTORETIC) 10-12.5 MG tablet; Take 1 tablet by mouth  daily.  Dispense: 90 tablet; Refill: 1 - CMP14+EGFR - CBC with Differential/Platelet  2. Mixed hyperlipidemia Low fat diet - rosuvastatin (CRESTOR) 10 MG tablet; Take 1 tablet (10 mg total) by mouth daily. Needs to be seen for  further refills.  Dispense: 90 tablet; Refill: 1 - Lipid panel  3. Hypothyroidism due to acquired atrophy of thyroid - levothyroxine (SYNTHROID) 150 MCG tablet; Take 1 tablet (150 mcg total) by mouth daily.  Dispense: 90 tablet; Refill: 1 - Thyroid Panel With TSH  4. Primary insomnia Bedtime routine - zolpidem (AMBIEN) 10 MG tablet; Take 1 tablet (10 mg total) by mouth at bedtime.  Dispense: 30 tablet; Refill: 3  5. Recurrent major depressive disorder, in full remission [facility]) Stress management  6. GAD (generalized anxiety disorder) - ALPRAZolam (XANAX) 1 MG tablet; Take 1 tablet (1 mg total) by mouth 2 (two) times daily as needed for anxiety.  Dispense: 60 tablet; Refill: 3  7. DDD (degenerative disc disease), lumbar Continue mortin or tylenol as needed  8. Depression - escitalopram (LEXAPRO) 20 MG tablet; Take 1 tablet (20 mg total) by mouth daily.  Dispense: 90 tablet; Refill: 1  9. Prostate cancer screening - PSA, total and free   Labs pending Health Maintenance reviewed Diet and exercise encouraged  Follow up plan: 6 months   [place], FNP

## 2019-10-27 NOTE — Patient Instructions (Signed)
Stress, Adult Stress is a normal reaction to life events. Stress is what you feel when life demands more than you are used to, or more than you think you can handle. Some stress can be useful, such as studying for a test or meeting a deadline at work. Stress that occurs too often or for too long can cause problems. It can affect your emotional health and interfere with relationships and normal daily activities. Too much stress can weaken your body's defense system (immune system) and increase your risk for physical illness. If you already have a medical problem, stress can make it worse. What are the causes? All sorts of life events can cause stress. An event that causes stress for one person may not be stressful for another person. Major life events, whether positive or negative, commonly cause stress. Examples include:  Losing a job or starting a new job.  Losing a loved one.  Moving to a new town or home.  Getting married or divorced.  Having a baby.  Getting injured or sick. Less obvious life events can also cause stress, especially if they occur day after day or in combination with each other. Examples include:  Working long hours.  Driving in traffic.  Caring for children.  Being in debt.  Being in a difficult relationship. What are the signs or symptoms? Stress can cause emotional symptoms, including:  Anxiety. This is feeling worried, afraid, on edge, overwhelmed, or out of control.  Anger, including irritation or impatience.  Depression. This is feeling sad, down, helpless, or guilty.  Trouble focusing, remembering, or making decisions. Stress can cause physical symptoms, including:  Aches and pains. These may affect your head, neck, back, stomach, or other areas of your body.  Tight muscles or a clenched jaw.  Low energy.  Trouble sleeping. Stress can cause unhealthy behaviors, including:  Eating to feel better (overeating) or skipping meals.  Working too  much or putting off tasks.  Smoking, drinking alcohol, or using drugs to feel better. How is this diagnosed? Stress is diagnosed through an assessment by your health care provider. He or she may diagnose this condition based on:  Your symptoms and any stressful life events.  Your medical history.  Tests to rule out other causes of your symptoms. Depending on your condition, your health care provider may refer you to a specialist for further evaluation. How is this treated?  Stress management techniques are the recommended treatment for stress. Medicine is not typically recommended for the treatment of stress. Techniques to reduce your reaction to stressful life events include:  Stress identification. Monitor yourself for symptoms of stress and identify what causes stress for you. These skills may help you to avoid or prepare for stressful events.  Time management. Set your priorities, keep a calendar of events, and learn to say no. Taking these actions can help you avoid making too many commitments. Techniques for coping with stress include:  Rethinking the problem. Try to think realistically about stressful events rather than ignoring them or overreacting. Try to find the positives in a stressful situation rather than focusing on the negatives.  Exercise. Physical exercise can release both physical and emotional tension. The key is to find a form of exercise that you enjoy and do it regularly.  Relaxation techniques. These relax the body and mind. The key is to find one or more that you enjoy and use the techniques regularly. Examples include: ? Meditation, deep breathing, or progressive relaxation techniques. ? Yoga or   tai chi. ? Biofeedback, mindfulness techniques, or journaling. ? Listening to music, being out in nature, or participating in other hobbies.  Practicing a healthy lifestyle. Eat a balanced diet, drink plenty of water, limit or avoid caffeine, and get plenty of  sleep.  Having a strong support network. Spend time with family, friends, or other people you enjoy being around. Express your feelings and talk things over with someone you trust. Counseling or talk therapy with a mental health professional may be helpful if you are having trouble managing stress on your own. Follow these instructions at home: Lifestyle   Avoid drugs.  Do not use any products that contain nicotine or tobacco, such as cigarettes, e-cigarettes, and chewing tobacco. If you need help quitting, ask your health care provider.  Limit alcohol intake to no more than 1 drink a day for nonpregnant women and 2 drinks a day for men. One drink equals 12 oz of beer, 5 oz of wine, or 1 oz of hard liquor  Do not use alcohol or drugs to relax.  Eat a balanced diet that includes fresh fruits and vegetables, whole grains, lean meats, fish, eggs, and beans, and low-fat dairy. Avoid processed foods and foods high in added fat, sugar, and salt.  Exercise at least 30 minutes on 5 or more days each week.  Get 7-8 hours of sleep each night. General instructions   Practice stress management techniques as discussed with your health care provider.  Drink enough fluid to keep your urine clear or pale yellow.  Take over-the-counter and prescription medicines only as told by your health care provider.  Keep all follow-up visits as told by your health care provider. This is important. Contact a health care provider if:  Your symptoms get worse.  You have new symptoms.  You feel overwhelmed by your problems and can no longer manage them on your own. Get help right away if:  You have thoughts of hurting yourself or others. If you ever feel like you may hurt yourself or others, or have thoughts about taking your own life, get help right away. You can go to your nearest emergency department or call:  Your local emergency services (911 in the [place]  A suicide crisis helpline, such as the  [place] at (316) 250-6172. This is open 24 hours a day. Summary  Stress is a normal reaction to life events. It can cause problems if it happens too often or for too long.  Practicing stress management techniques is the best way to treat stress.  Counseling or talk therapy with a mental health professional may be helpful if you are having trouble managing stress on your own. This information is not intended to replace advice given to you by your health care provider. Make sure you discuss any questions you have with your health care provider. Document Revised: 03/27/2019 Document Reviewed: 10/17/2016 Elsevier Patient Education  [place].

## 2019-10-28 LAB — LIPID PANEL
Chol/HDL Ratio: 4.2 ratio (ref 0.0–5.0)
Cholesterol, Total: 174 mg/dL (ref 100–199)
HDL: 41 mg/dL (ref >39)
LDL Chol Calc (NIH): 98 mg/dL (ref 0–99)
Triglycerides: 204 mg/dL — ABNORMAL HIGH (ref 0–149)
VLDL Cholesterol Cal: 35 mg/dL (ref 5–40)

## 2019-10-28 LAB — CBC WITH DIFFERENTIAL/PLATELET
Basophils Absolute: 0.1 10*3/uL (ref 0.0–0.2)
Basos: 1 %
EOS (ABSOLUTE): 0.1 10*3/uL (ref 0.0–0.4)
Eos: 1 %
Hematocrit: 48 % (ref 37.5–51.0)
Hemoglobin: 16.1 g/dL (ref 13.0–17.7)
Immature Grans (Abs): 0 10*3/uL (ref 0.0–0.1)
Immature Granulocytes: 0 %
Lymphocytes Absolute: 3.8 10*3/uL — ABNORMAL HIGH (ref 0.7–3.1)
Lymphs: 33 %
MCH: 29.3 pg (ref 26.6–33.0)
MCHC: 33.5 g/dL (ref 31.5–35.7)
MCV: 87 fL (ref 79–97)
Monocytes Absolute: 0.8 10*3/uL (ref 0.1–0.9)
Monocytes: 7 %
Neutrophils Absolute: 6.8 10*3/uL (ref 1.4–7.0)
Neutrophils: 58 %
Platelets: 352 10*3/uL (ref 150–450)
RBC: 5.49 x10E6/uL (ref 4.14–5.80)
RDW: 12.4 % (ref 11.6–15.4)
WBC: 11.6 10*3/uL — ABNORMAL HIGH (ref 3.4–10.8)

## 2019-10-28 LAB — CMP14+EGFR
ALT: 110 IU/L — ABNORMAL HIGH (ref 0–44)
AST: 61 IU/L — ABNORMAL HIGH (ref 0–40)
Albumin/Globulin Ratio: 1.6 (ref 1.2–2.2)
Albumin: 4.2 g/dL (ref 3.8–4.9)
Alkaline Phosphatase: 103 IU/L (ref 39–117)
BUN/Creatinine Ratio: 8 — ABNORMAL LOW (ref 9–20)
BUN: 10 mg/dL (ref 6–24)
Bilirubin Total: 0.6 mg/dL (ref 0.0–1.2)
CO2: 24 mmol/L (ref 20–29)
Calcium: 9.2 mg/dL (ref 8.7–10.2)
Chloride: 103 mmol/L (ref 96–106)
Creatinine, Ser: 1.21 mg/dL (ref 0.76–1.27)
GFR calc Af Amer: 79 mL/min/{1.73_m2} (ref >59)
GFR calc non Af Amer: 68 mL/min/{1.73_m2} (ref >59)
Globulin, Total: 2.7 g/dL (ref 1.5–4.5)
Glucose: 75 mg/dL (ref 65–99)
Potassium: 3.8 mmol/L (ref 3.5–5.2)
Sodium: 145 mmol/L — ABNORMAL HIGH (ref 134–144)
Total Protein: 6.9 g/dL (ref 6.0–8.5)

## 2019-10-28 LAB — THYROID PANEL WITH TSH
Free Thyroxine Index: 2.5 (ref 1.2–4.9)
T3 Uptake Ratio: 27 % (ref 24–39)
T4, Total: 9.1 ug/dL (ref 4.5–12.0)
TSH: 0.12 u[IU]/mL — ABNORMAL LOW (ref 0.450–4.500)

## 2019-10-28 LAB — PSA, TOTAL AND FREE
PSA, Free Pct: 30 %
PSA, Free: 0.24 ng/mL
Prostate Specific Ag, Serum: 0.8 ng/mL (ref 0.0–4.0)

## 2020-03-23 ENCOUNTER — Other Ambulatory Visit: Payer: Self-pay | Admitting: Nurse Practitioner

## 2020-04-20 ENCOUNTER — Encounter: Payer: Self-pay | Admitting: Family Medicine

## 2020-04-20 ENCOUNTER — Other Ambulatory Visit: Payer: Self-pay

## 2020-04-20 ENCOUNTER — Ambulatory Visit (INDEPENDENT_AMBULATORY_CARE_PROVIDER_SITE_OTHER): Payer: 59 | Admitting: Family Medicine

## 2020-04-20 ENCOUNTER — Ambulatory Visit: Payer: 59 | Admitting: Family Medicine

## 2020-04-20 VITALS — BP 119/81 | HR 82 | Temp 97.6°F | Ht 71.0 in | Wt 181.4 lb

## 2020-04-20 DIAGNOSIS — E890 Postprocedural hypothyroidism: Secondary | ICD-10-CM | POA: Diagnosis not present

## 2020-04-20 DIAGNOSIS — Z79899 Other long term (current) drug therapy: Secondary | ICD-10-CM

## 2020-04-20 DIAGNOSIS — R7989 Other specified abnormal findings of blood chemistry: Secondary | ICD-10-CM

## 2020-04-20 DIAGNOSIS — F411 Generalized anxiety disorder: Secondary | ICD-10-CM

## 2020-04-20 DIAGNOSIS — R9389 Abnormal findings on diagnostic imaging of other specified body structures: Secondary | ICD-10-CM

## 2020-04-20 DIAGNOSIS — E782 Mixed hyperlipidemia: Secondary | ICD-10-CM

## 2020-04-20 DIAGNOSIS — D72820 Lymphocytosis (symptomatic): Secondary | ICD-10-CM

## 2020-04-20 DIAGNOSIS — Z8249 Family history of ischemic heart disease and other diseases of the circulatory system: Secondary | ICD-10-CM

## 2020-04-20 DIAGNOSIS — I10 Essential (primary) hypertension: Secondary | ICD-10-CM | POA: Diagnosis not present

## 2020-04-20 DIAGNOSIS — F5101 Primary insomnia: Secondary | ICD-10-CM | POA: Diagnosis not present

## 2020-04-20 MED ORDER — ZOLPIDEM TARTRATE 10 MG PO TABS: 5.0000 mg | ORAL_TABLET | Freq: Every evening | ORAL | 3 refills | Status: DC | PRN

## 2020-04-20 MED ORDER — HYDROCORTISONE (PERIANAL) 2.5 % EX CREA: 1.0000 "application " | TOPICAL_CREAM | Freq: Two times a day (BID) | CUTANEOUS | 0 refills | Status: DC

## 2020-04-20 MED ORDER — ROSUVASTATIN CALCIUM 20 MG PO TABS: 20.0000 mg | ORAL_TABLET | Freq: Every day | ORAL | 0 refills | Status: DC

## 2020-04-20 MED ORDER — ALPRAZOLAM 1 MG PO TABS: 0.5000 mg | ORAL_TABLET | Freq: Two times a day (BID) | ORAL | 0 refills | Status: DC | PRN

## 2020-04-20 NOTE — Progress Notes (Signed)
Subjective: CC: Establish care, generalized anxiety disorder, insomnia, hypothyroidism, hypertension, hyperlipidemia PCP: Janora Norlander, DO LOV:FIEPPIR E Kenneth [name] is a 54 y.o. male presenting to clinic today for:  1.  Hypertension with hyperlipidemia Patient reports compliance with lisinopril-hydrochlorothiazide 10-12.5 mg daily.  He recently restarted his cholesterol medication.  Apparently he had discontinued this because he had had a rise in his liver function tests during his previous check with PCP.  He does not report any abdominal pain, jaundice or myalgias.  He does have chronic low back pain that impacts his right lower extremity status post surgical intervention.  He has a strong family history of cardiovascular disease in his father and father's side.  He states that his father had his first heart attack at age 51.  His paternal grandfather also had heart attack in 62s.  His paternal cousins have had MIs in their 45s.  He has had a stress test but it is been many years.  His father currently sees Dr. Percival Spanish and he would like a referral to him.  He also states that his mother has history of CVA.  He had a "life scan" done and this showed buildup within the carotid arteries.  He worries about this given family history of cardiovascular disease and events.  2.  Anxiety disorder/insomnia Patient with longstanding history of anxiety disorder and insomnia.  He uses the Ambien every night.  Sometimes he can get away with taking only half tablet but most nights he needs the full 10 mg in order to rest.  He denies any hallucinations, excessive daytime sleepiness, falls.  He also suffers from anxiety disorder both generalized and situational.  He takes Lexapro 20 mg daily and also uses alprazolam 0.5 to 1 mg 3-4 times per week (rarely more than once daily) as needed situational anxiety.  He describes events where he has to go into public arenas, like a lowe's, causing excessive anxiety such  that he will have to leave the store before finishing his shopping.  He seems to do better if he has somebody with him.  He denies again any falls.  However, he has had some memory changes over the last year but has not paid attention to how this may or may not correlate with use of benzodiazepines.  He does report having had a dependence on Percocet previously for his low back and tries to be very conscientious of this with controlled substances.  3.  Hypothyroidism, postsurgical Patient reports history of having had a thyroid abnormality on previous LifeScan.  He had this further evaluated by endocrinology and subsequently had surgical removal of the thyroid for what appeared to be atypical precancerous cells.  He has since been on thyroid replacement.  In February, he had labs done and his TSH was noted to be suppressed.  His PCP subsequently changed his regimen to 150 mcg of Synthroid daily except for on Sundays he takes no medication.  Does not report any change in voice, tremor.   ROS: Per HPI  No Known Allergies Past Medical History:  Diagnosis Date  . DDD (degenerative disc disease), lumbar   . Hyperlipidemia   . Hypertension   . Hypothyroidism     Current Outpatient Medications:  .  ALPRAZolam (XANAX) 1 MG tablet, Take 1 tablet (1 mg total) by mouth 2 (two) times daily as needed for anxiety., Disp: 60 tablet, Rfl: 3 .  escitalopram (LEXAPRO) 20 MG tablet, Take 1 tablet (20 mg total) by mouth daily., Disp: 90  tablet, Rfl: 1 .  fenofibrate 160 MG tablet, TAKE 1 TABLET DAILY, Disp: 30 tablet, Rfl: 0 .  levothyroxine (SYNTHROID) 150 MCG tablet, Take 1 tablet (150 mcg total) by mouth daily., Disp: 90 tablet, Rfl: 1 .  lisinopril-hydrochlorothiazide (ZESTORETIC) 10-12.5 MG tablet, Take 1 tablet by mouth daily., Disp: 90 tablet, Rfl: 1 .  PROCTOZONE-HC 2.5 % rectal cream, APPLY RECTALLY 2 TIMES A DAY AS DIRECTED, Disp: 30 g, Rfl: 0 .  rosuvastatin (CRESTOR) 10 MG tablet, Take 1 tablet (10  mg total) by mouth daily. Needs to be seen for further refills., Disp: 90 tablet, Rfl: 1 .  zolpidem (AMBIEN) 10 MG tablet, Take 1 tablet (10 mg total) by mouth at bedtime., Disp: 30 tablet, Rfl: 3 Social History   Socioeconomic History  . Marital status: Married    Spouse name: Not on file  . Number of children: Not on file  . Years of education: Not on file  . Highest education level: Not on file  Occupational History  . Not on file  Tobacco Use  . Smoking status: Never Smoker  . Smokeless tobacco: Never Used  Vaping Use  . Vaping Use: Never used  Substance and Sexual Activity  . Alcohol use: No  . Drug use: No  . Sexual activity: Not on file  Other Topics Concern  . Not on file  Social History Narrative  . Not on file   Social Determinants of Health   Financial Resource Strain:   . Difficulty of Paying Living Expenses:   Food Insecurity:   . Worried About Charity fundraiser in the Last Year:   . Arboriculturist in the Last Year:   Transportation Needs:   . Film/video editor (Medical):   Marland Kitchen Lack of Transportation (Non-Medical):   Physical Activity:   . Days of Exercise per Week:   . Minutes of Exercise per Session:   Stress:   . Feeling of Stress :   Social Connections:   . Frequency of Communication with Friends and Family:   . Frequency of Social Gatherings with Friends and Family:   . Attends Religious Services:   . Active Member of Clubs or Organizations:   . Attends Archivist Meetings:   Marland Kitchen Marital Status:   Intimate Partner Violence:   . Fear of Current or Ex-Partner:   . Emotionally Abused:   Marland Kitchen Physically Abused:   . Sexually Abused:    Family History  Problem Relation Age of Onset  . Diabetes Mother   . Hypertension Mother   . Stroke Mother   . Hyperlipidemia Father   . Hypertension Father   . Heart attack Father   . Diabetes Father     Objective: Office vital signs reviewed. BP 119/81   Pulse 82   Temp 97.6 F (36.4 C)  (Temporal)   Ht 5\' 11"  (1.803 m)   Wt 181 lb 6.4 oz (82.3 kg)   SpO2 99%   BMI 25.30 kg/m   Physical Examination:  General: Awake, alert, well nourished, No acute distress HEENT: Normal; moist mucous membranes.  No exophthalmos; no carotid bruits Cardio: regular rate and rhythm, S1S2 heard, no murmurs appreciated Pulm: clear to auscultation bilaterally, no wheezes, rhonchi or rales; normal work of breathing on room air Extremities: warm, well perfused, No edema, cyanosis or clubbing; +2 pulses bilaterally MSK: Normal gait and station Skin: dry; intact; no rashes or lesions; normal temperature Neuro: No tremor Psych: Mood stable, speech normal, affect  appropriate, pleasant and interactive Depression screen [facility] 2/9 04/20/2020 10/27/2019 07/27/2019  Decreased Interest 0 0 1  Down, Depressed, Hopeless 0 0 1  PHQ - 2 Score 0 0 2  Altered sleeping 1 0 0  Tired, decreased energy 0 0 0  Change in appetite 0 0 0  Feeling bad or failure about yourself  0 0 0  Trouble concentrating 0 0 0  Moving slowly or fidgety/restless 0 0 0  Suicidal thoughts 0 0 0  PHQ-9 Score 1 0 2  Difficult doing work/chores Not difficult at all - Not difficult at all  Some recent data might be hidden   GAD 7 : Generalized Anxiety Score 04/20/2020 10/27/2019 07/27/2019 08/22/2018  Nervous, Anxious, on Edge 1 3 1 1   Control/stop worrying 1 3 1 3   Worry too much - different things 1 3 1 3   Trouble relaxing 2 1 0 3  Restless 0 0 0 0  Easily annoyed or irritable 0 0 1 1  Afraid - awful might happen 0 3 0 3  Total GAD 7 Score 5 13 4 14   Anxiety Difficulty - Very difficult Somewhat difficult -   Assessment/ Plan: 54 y.o. male   1. Hypothyroidism, post operative Has anxiety but do not think that this is related to his thyroid.  Check thyroid panel given suppression in TSH last visit. - Thyroid Panel With TSH  2. Essential hypertension Controlled.  Continue current regimen  3. Mixed hyperlipidemia Check fasting  lipid panel, liver function panel.  I am going to discontinue fenofibrate and increase his Crestor.  Reviewed his labs today and his ASCVD risk for 6.0%.  Given family history of early cardiovascular disease and CVA I do think the cholesterol control will be essential in this patient in preventing any cardiovascular events.  However, I do worry that use of both statin and fenofibrate may be contributing to elevations in liver function test.  Alternatively if we find that the liver function test remain elevated despite change in medication regimen, we could consider treating with Vascepa plus Zetia/Nexletol.  May consider consultation to clinical pharmacist in-house pending lab results and tolerance of increased dose - Lipid panel; Future - Hepatic function panel; Future - rosuvastatin (CRESTOR) 20 MG tablet; Take 1 tablet (20 mg total) by mouth daily.  Dispense: 90 tablet; Refill: 0  4. Primary insomnia No red flags.  The national narcotic database reviewed and there were no red flags.  Urine drug screen could not be obtained today (patient was unfortunately not able to urinate during visit) but patient was willing to do a blood drug screen today.  I have little reason to suspect misuse or divergence of medication.  However, will comply with office policy - Drug Screen 10 W/Conf, Se - zolpidem (AMBIEN) 10 MG tablet; Take 0.5-1 tablets (5-10 mg total) by mouth at bedtime as needed for sleep.  Dispense: 30 tablet; Refill: 3  5. GAD (generalized anxiety disorder) Discussed the risks of dementia with chronic benzodiazepine use.  He is on max dose of Lexapro.  Unsure if he is ever been trialed on buspirone or Atarax but could consider this in lieu of benzodiazepine should he prefer.  I would certainly think that it would be of benefit to switch the alprazolam to lorazepam given elevation of liver function tests.  I discussed that with him today and he seems to be amenable to this.  He is going to do a little  bit more research and we will plan  to reconvene at a later date and time about this.  I reinforced need to use this extremely sparingly and only as needed. - Drug Screen 10 W/Conf, Se - ALPRAZolam (XANAX) 1 MG tablet; Take 0.5-1 tablets (0.5-1 mg total) by mouth 2 (two) times daily as needed for anxiety.  Dispense: 60 tablet; Refill: 0  6. Controlled substance agreement signed - Drug Screen 10 W/Conf, Se  7. Elevated liver function tests - Hepatic Function Panel  8. Lymphocytosis Uncertain etiology.  Recheck CBC - CBC with Differential  9. Abnormal carotid ultrasound Plan to refer for cardiac eval.  Patient was interested in CT calcium.  Will defer the appropriateness of this to cardiology.  However, given family history of MI certainly think he would benefit from at least a stress test.  Carotid duplex ordered to follow-up on abnormalities noted on LifeScan - Ambulatory referral to Cardiology - US Carotid Duplex Bilateral; Future  10. Family history of early CAD - Ambulatory referral to Cardiology - US Carotid Duplex Bilateral; Future   No orders of the defined types were placed in this encounter.  No orders of the defined types were placed in this encounter.    Janora Norlander, DO [place] [phone]

## 2020-04-20 NOTE — Patient Instructions (Addendum)
I think Lorazepam may be safer for you in the long run since this is not heavily metabolized by the liver.  STOP fenofibrate. Go up on Crestor 20mg  at bedtime. Come in for fasting lipid and repeat liver function tests in 3 months.  You had labs performed today.  You will be contacted with the results of the labs once they are available, usually in the next 3 business days for routine lab work.  If you have an active my chart account, they will be released to your MyChart.  If you prefer to have these labs released to you via telephone, please let us know.  If you had a pap smear or biopsy performed, expect to be contacted in about 7-10 days.   Carotid Artery Disease  Carotid artery disease is the narrowing or blockage of one or both carotid arteries. This condition is also called carotid artery stenosis. The carotid arteries are the two main blood vessels on either side of the neck. They send blood to the brain, other parts of the head, and the neck.  This condition increases your risk for a stroke or a transient ischemic attack (TIA). A TIA is a "mini-stroke" that causes stroke-like symptoms that go away quickly. What are the causes? This condition is mainly caused by a narrowing and hardening of the carotid arteries. The carotid arteries can become narrow or clogged with a buildup of plaque. Plaque includes:  Fat.  Cholesterol.  Calcium.  Other substances. What increases the risk? The following factors may make you more likely to develop this condition:  Having certain medical conditions, such as: ? High cholesterol. ? High blood pressure. ? Diabetes. ? Obesity.  Smoking.  A family history of cardiovascular disease.  Not being active or lack of regular exercise.  Being male. Men have a higher risk of having arteries become narrow and harden earlier in life than women.  Old age. What are the signs or symptoms? This condition may not have any signs or symptoms until a  stroke or TIA happens. In some cases, your doctor may be able to hear a whooshing sound. This can suggest a change in blood flow caused by plaque buildup. An eye exam can also help find signs of the condition. How is this treated? This condition may be treated with more than one treatment. Treatment options include:  Lifestyle changes, such as: ? Quitting smoking. ? Getting regular exercise, or getting exercise as told by your doctor. ? Eating a healthy diet. ? Managing stress. ? Keeping a healthy weight.  Medicines to control: ? Blood pressure. ? Cholesterol. ? Blood clotting.  Surgery. You may have: ? A surgery to remove the blockages in the carotid arteries. ? A procedure in which a small mesh tube (stent) is used to widen the blocked carotid arteries. Follow these instructions at home: Eating and drinking Follow instructions about your diet from your doctor. It is important to follow a healthy diet.  Eat a diet that includes: ? A lot of fresh fruits and vegetables. ? Low-fat (lean) meats.  Avoid these foods: ? Foods that are high in fat. ? Foods that are high in salt (sodium). ? Foods that are fried. ? Foods that are processed. ? Foods that have few good nutrients (poor nutritional value).  Lifestyle   Keep a healthy weight.  Do exercises as told by your doctor to stay active. Each week, you should get one of the following: ? At least 150 minutes of exercise that  raises your heart rate and makes you sweat (moderate-intensity exercise). ? At least 75 minutes of exercise that takes a lot of effort.  Do not use any products that contain nicotine or tobacco, such as cigarettes, e-cigarettes, and chewing tobacco. If you need help quitting, ask your doctor.  Do not drink alcohol if: ? Your doctor tells you not to drink. ? You are pregnant, may be pregnant, or are planning to become pregnant.  If you drink alcohol: ? Limit how much you use to:  0-1 drink a day for  women.  0-2 drinks a day for men. ? Be aware of how much alcohol is in your drink. In the [place] one drink equals one 12 oz bottle of beer (355 mL), one 5 oz glass of wine (148 mL), or one 1 oz glass of hard liquor (44 mL).  Do not use drugs.  Manage your stress. Ask your doctor for tips on how to do this. General instructions  Take over-the-counter and prescription medicines only as told by your doctor.  Keep all follow-up visits as told by your doctor. This is important. Where to find more information  American Heart Association: www.heart.org Get help right away if:  You have any signs of a stroke. "BE FAST" is an easy way to remember the main warning signs: ? B - Balance. Signs are dizziness, sudden trouble walking, or loss of balance. ? E - Eyes. Signs are trouble seeing or a change in how you see. ? F - Face. Signs are sudden weakness or loss of feeling of the face, or the face or eyelid drooping on one side. ? A - Arms. Signs are weakness or loss of feeling in an arm. This happens suddenly and usually on one side of the body. ? S - Speech. Signs are sudden trouble speaking, slurred speech, or trouble understanding what people say. ? T - Time. Time to call emergency services. Write down what time symptoms started.  You have other signs of a stroke, such as: ? A sudden, very bad headache with no known cause. ? Feeling like you may vomit (nausea). ? Vomiting. ? A seizure. These symptoms may be an emergency. Do not wait to see if the symptoms will go away. Get medical help right away. Call your local emergency services (911 in the [place] Do not drive yourself to the hospital. Summary  The carotid arteries are blood vessels on both sides of the neck.  If these arteries get smaller or get blocked, you are more likely to have a stroke or a mini-stroke.  This condition can be treated with lifestyle changes, medicines, surgery, or a blend of these treatments.  Get help right  away if you have any signs of a stroke. "BE FAST" is an easy way to remember the main warning signs of stroke. This information is not intended to replace advice given to you by your health care provider. Make sure you discuss any questions you have with your health care provider. Document Revised: 03/23/2019 Document Reviewed: 03/23/2019 Elsevier Patient Education  [place].

## 2020-04-22 ENCOUNTER — Other Ambulatory Visit: Payer: Self-pay | Admitting: Family Medicine

## 2020-04-22 DIAGNOSIS — E034 Atrophy of thyroid (acquired): Secondary | ICD-10-CM

## 2020-04-22 MED ORDER — LEVOTHYROXINE SODIUM 150 MCG PO TABS: 150.0000 ug | ORAL_TABLET | Freq: Every day | ORAL | 1 refills | Status: DC

## 2020-04-25 ENCOUNTER — Ambulatory Visit: Payer: Self-pay | Admitting: Nurse Practitioner

## 2020-04-27 ENCOUNTER — Ambulatory Visit (HOSPITAL_COMMUNITY)
Admission: RE | Admit: 2020-04-27 | Discharge: 2020-04-27 | Disposition: A | Discharge status: Home / Self Care | Payer: 59 | Source: Ambulatory Visit | Attending: Family Medicine | Admitting: Family Medicine

## 2020-04-27 ENCOUNTER — Other Ambulatory Visit: Payer: Self-pay

## 2020-04-27 DIAGNOSIS — Z8249 Family history of ischemic heart disease and other diseases of the circulatory system: Secondary | ICD-10-CM | POA: Insufficient documentation

## 2020-04-27 DIAGNOSIS — R9389 Abnormal findings on diagnostic imaging of other specified body structures: Secondary | ICD-10-CM | POA: Diagnosis present

## 2020-04-28 LAB — BENZODIAZEPINES,MS,WB/SP RFX
7-Aminoclonazepam: NEGATIVE ng/mL
Alprazolam: 22.7 ng/mL
Benzodiazepines Confirm: POSITIVE
Chlordiazepoxide: NEGATIVE ng/mL
Clonazepam: NEGATIVE ng/mL
Desalkylflurazepam: NEGATIVE ng/mL
Desmethylchlordiazepoxide: NEGATIVE ng/mL
Desmethyldiazepam: NEGATIVE ng/mL
Diazepam: NEGATIVE ng/mL
Flurazepam: NEGATIVE ng/mL
Lorazepam: NEGATIVE ng/mL
Midazolam: NEGATIVE ng/mL
Oxazepam: NEGATIVE ng/mL
Temazepam: NEGATIVE ng/mL
Triazolam: NEGATIVE ng/mL

## 2020-04-28 LAB — DRUG SCREEN 10 W/CONF, SERUM
Amphetamines, IA: NEGATIVE ng/mL
Barbiturates, IA: NEGATIVE ug/mL
Benzodiazepines, IA: POSITIVE ng/mL — AB
Cocaine & Metabolite, IA: NEGATIVE ng/mL
Methadone, IA: NEGATIVE ng/mL
Opiates, IA: NEGATIVE ng/mL
Oxycodones, IA: NEGATIVE ng/mL
Phencyclidine, IA: NEGATIVE ng/mL
Propoxyphene, IA: NEGATIVE ng/mL
THC(Marijuana) Metabolite, IA: NEGATIVE ng/mL

## 2020-04-28 LAB — CBC WITH DIFFERENTIAL/PLATELET
Basophils Absolute: 0.1 10*3/uL (ref 0.0–0.2)
Basos: 1 %
EOS (ABSOLUTE): 0.2 10*3/uL (ref 0.0–0.4)
Eos: 3 %
Hematocrit: 44.3 % (ref 37.5–51.0)
Hemoglobin: 15.1 g/dL (ref 13.0–17.7)
Immature Grans (Abs): 0 10*3/uL (ref 0.0–0.1)
Immature Granulocytes: 0 %
Lymphocytes Absolute: 3.4 10*3/uL — ABNORMAL HIGH (ref 0.7–3.1)
Lymphs: 38 %
MCH: 28.9 pg (ref 26.6–33.0)
MCHC: 34.1 g/dL (ref 31.5–35.7)
MCV: 85 fL (ref 79–97)
Monocytes Absolute: 0.7 10*3/uL (ref 0.1–0.9)
Monocytes: 7 %
Neutrophils Absolute: 4.7 10*3/uL (ref 1.4–7.0)
Neutrophils: 51 %
Platelets: 337 10*3/uL (ref 150–450)
RBC: 5.22 x10E6/uL (ref 4.14–5.80)
RDW: 12.7 % (ref 11.6–15.4)
WBC: 9.1 10*3/uL (ref 3.4–10.8)

## 2020-04-28 LAB — THYROID PANEL WITH TSH
Free Thyroxine Index: 3.2 (ref 1.2–4.9)
T3 Uptake Ratio: 30 % (ref 24–39)
T4, Total: 10.7 ug/dL (ref 4.5–12.0)
TSH: 0.547 u[IU]/mL (ref 0.450–4.500)

## 2020-04-28 LAB — HEPATIC FUNCTION PANEL
ALT: 26 IU/L (ref 0–44)
AST: 26 IU/L (ref 0–40)
Albumin: 4.3 g/dL (ref 3.8–4.9)
Alkaline Phosphatase: 58 IU/L (ref 48–121)
Bilirubin Total: 0.4 mg/dL (ref 0.0–1.2)
Bilirubin, Direct: 0.13 mg/dL (ref 0.00–0.40)
Total Protein: 6.7 g/dL (ref 6.0–8.5)

## 2020-05-03 DIAGNOSIS — I6529 Occlusion and stenosis of unspecified carotid artery: Secondary | ICD-10-CM | POA: Insufficient documentation

## 2020-05-03 DIAGNOSIS — Z7189 Other specified counseling: Secondary | ICD-10-CM | POA: Insufficient documentation

## 2020-05-03 NOTE — Progress Notes (Signed)
Cardiology Office Note   Date:  05/04/2020   ID:  Kenneth, [name] 1966-01-30, MRN [id number]  PCP:  Janora Norlander, DO  Cardiologist:   No primary care provider on file.   Chief Complaint  Patient presents with  . Cough      History of Present Illness: Kenneth [name] is a 54 y.o. male who is referred by Janora Norlander, DO for evaluation of PVD with carotid stenosis.  He has no history of heart disease although he has a strong family history of early coronary artery disease.  His mother also has hypertrophic cardiomyopathy.  He did have an echo many years ago and did not have evidence of this.  This was over 15 years ago.  He is new to me.  He is active.  He might get some shortness of breath with significant exertional activity but he does not have any resting shortness of breath, PND or orthopnea.  He does not have any palpitations, presyncope or syncope.  He is not describing any chest pressure, neck or arm discomfort.  He works in his yard.  He takes care of donkeys and goats.  Shop he has about 300 yards from his house.  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  He has had significant hypertriglyceridemia that has been well managed but more recently he has had to stop his fenofibrate because of elevated liver enzymes.  Of note he has had a dry hacking nonproductive cough recently without clear etiology.  Past Medical History:  Diagnosis Date  . DDD (degenerative disc disease), lumbar   . Hyperlipidemia   . Hypertension   . Hypothyroidism     Past Surgical History:  Procedure Laterality Date  . BACK SURGERY     disectomy  . THYROIDECTOMY  2010     Current Outpatient Medications  Medication Sig Dispense Refill  . ALPRAZolam (XANAX) 1 MG tablet Take 0.5-1 tablets (0.5-1 mg total) by mouth 2 (two) times daily as needed for anxiety. 60 tablet 0  . escitalopram (LEXAPRO) 20 MG tablet Take 1 tablet (20 mg total) by  mouth daily. 90 tablet 1  . hydrocortisone (PROCTOZONE-HC) 2.5 % rectal cream Place 1 application rectally 2 (two) times daily. 30 g 0  . levothyroxine (SYNTHROID) 150 MCG tablet Take 1 tablet (150 mcg total) by mouth daily. 90 tablet 1  . rosuvastatin (CRESTOR) 20 MG tablet Take 1 tablet (20 mg total) by mouth daily. (Patient taking differently: Take 20 mg by mouth 2 (two) times daily. ) 90 tablet 0  . zolpidem (AMBIEN) 10 MG tablet Take 0.5-1 tablets (5-10 mg total) by mouth at bedtime as needed for sleep. 30 tablet 3  . losartan-hydrochlorothiazide (HYZAAR) 50-12.5 MG tablet Take 1 tablet by mouth daily. 90 tablet 3   No current facility-administered medications for this visit.    Allergies:   Patient has no known allergies.    Social History:  The patient  reports that he has never smoked. He has never used smokeless tobacco. He reports that he does not drink alcohol and does not use drugs.   Family History:  The patient's family history includes Diabetes in his father and mother; Heart attack in his father; Hyperlipidemia in his father; Hypertension in his father and mother; Hypertrophic cardiomyopathy in his mother; Stroke in his mother.    ROS:  Please see the history of present illness.   Otherwise, review of systems are  positive for none.   All other systems are reviewed and negative.    PHYSICAL EXAM: VS:  BP 110/80   Pulse 67   Ht 5\' 11"  (1.803 m)   Wt 185 lb (83.9 kg)   SpO2 98%   BMI 25.80 kg/m  , BMI Body mass index is 25.8 kg/m. GENERAL:  Well appearing HEENT:  Pupils equal round and reactive, fundi not visualized, oral mucosa unremarkable NECK:  No jugular venous distention, waveform within normal limits, carotid upstroke brisk and symmetric, no bruits, no thyromegaly LYMPHATICS:  No cervical, inguinal adenopathy LUNGS:  Clear to auscultation bilaterally BACK:  No CVA tenderness CHEST:  Unremarkable HEART:  PMI not displaced or sustained,S1 and S2 within normal  limits, no S3, no S4, no clicks, no rubs, no murmurs ABD:  Flat, positive bowel sounds normal in frequency in pitch, no bruits, no rebound, no guarding, no midline pulsatile mass, no hepatomegaly, no splenomegaly EXT:  2 plus pulses throughout, no edema, no cyanosis no clubbing SKIN:  No rashes no nodules NEURO:  Cranial nerves II through XII grossly intact, motor grossly intact throughout PSYCH:  Cognitively intact, oriented to person place and time    EKG:  EKG is ordered today. The ekg ordered today demonstrates sinus rhythm, rate 67, low voltage in the limb leads, early transition lead V2, no acute ST-T wave changes.   Recent Labs: 10/27/2019: BUN 10; Creatinine, Ser 1.21; Potassium 3.8; Sodium 145 04/20/2020: ALT 26; Hemoglobin 15.1; Platelets 337; TSH 0.547    Lipid Panel    Component Value Date/Time   CHOL 174 10/27/2019 1437   CHOL 285 (H) 01/26/2013 1722   TRIG 204 (H) 10/27/2019 1437   TRIG 93 05/06/2014 1005   TRIG 121 01/26/2013 1722   HDL 41 10/27/2019 1437   HDL 41 05/06/2014 1005   HDL 48 01/26/2013 1722   CHOLHDL 4.2 10/27/2019 1437   LDLCALC 98 10/27/2019 1437   LDLCALC 111 (H) 05/06/2014 1005   LDLCALC 213 (H) 01/26/2013 1722      Wt Readings from Last 3 Encounters:  05/04/20 185 lb (83.9 kg)  04/20/20 181 lb 6.4 oz (82.3 kg)  10/27/19 180 lb (81.6 kg)      Other studies Reviewed: Additional studies/ records that were reviewed today include: None. Review of the above records demonstrates:  Please see elsewhere in the note.     ASSESSMENT AND PLAN:  FAMILY HISTORY OF HOCM: Given this family history and the fact that has not been screened in quite a while and the fact that there is no gene to follow I will send him for screening echocardiogram.  SOB: The patient has significant cardiovascular risk factors.  I will start with a coronary calcium score.  DYSLIPIDEMIA: As mentioned above he has had high triglycerides and recently had to stop  fenofibrate.  Goals of therapy will be based on the results of the coronary calcium score.  HTN: Because of his cough I will stop his lisinopril and start him on losartan HCT.  COVID EDUCATION: He has been vaccinated.   Current medicines are reviewed at length with the patient today.  The patient does not have concerns regarding medicines.  The following changes have been made:  no change  Labs/ tests ordered today include: None  Orders Placed This Encounter  Procedures  . CT CARDIAC SCORING  . EKG 12-Lead  . ECHOCARDIOGRAM COMPLETE     Disposition:   FU with me as needed.     Signed,  Minus Breeding, MD  05/04/2020 5:51 PM    [place]

## 2020-05-04 ENCOUNTER — Ambulatory Visit (INDEPENDENT_AMBULATORY_CARE_PROVIDER_SITE_OTHER): Payer: 59 | Admitting: Cardiology

## 2020-05-04 ENCOUNTER — Encounter: Payer: Self-pay | Admitting: Cardiology

## 2020-05-04 ENCOUNTER — Other Ambulatory Visit: Payer: Self-pay

## 2020-05-04 VITALS — BP 110/80 | HR 67 | Ht 71.0 in | Wt 185.0 lb

## 2020-05-04 DIAGNOSIS — R0602 Shortness of breath: Secondary | ICD-10-CM

## 2020-05-04 DIAGNOSIS — Z7189 Other specified counseling: Secondary | ICD-10-CM | POA: Diagnosis not present

## 2020-05-04 DIAGNOSIS — I6529 Occlusion and stenosis of unspecified carotid artery: Secondary | ICD-10-CM

## 2020-05-04 DIAGNOSIS — Z8249 Family history of ischemic heart disease and other diseases of the circulatory system: Secondary | ICD-10-CM | POA: Diagnosis not present

## 2020-05-04 MED ORDER — LOSARTAN POTASSIUM-HCTZ 50-12.5 MG PO TABS
1.0000 | ORAL_TABLET | Freq: Every day | ORAL | 3 refills | Status: DC
Start: 2020-05-04 — End: 2020-12-08

## 2020-05-04 NOTE — Patient Instructions (Signed)
Medication Instructions:  Stop Lisinopril-Hydrochlorothiazide 10-12.5 mg Start Losartan-Hydrochlorothiazide 50-12.5 mg daily  *If you need a refill on your cardiac medications before your next appointment, please call your pharmacy*   Lab Work: None  Testing/Procedures: Your physician has requested that you have an echocardiogram. Echocardiography is a painless test that uses sound waves to create images of your heart. It provides your doctor with information about the size and shape of your heart and how well your heart's chambers and valves are working. This procedure takes approximately one hour. There are no restrictions for this procedure. [place] [address]  Calcium Score 1126 [place] [address]    Follow-Up: At [facility], you and your health needs are our priority.  As part of our continuing mission to provide you with exceptional heart care, we have created designated Provider Care Teams.  These Care Teams include your primary Cardiologist (physician) and Advanced Practice Providers (APPs -  Physician Assistants and Nurse Practitioners) who all work together to provide you with the care you need, when you need it.  We recommend signing up for the patient portal called "MyChart".  Sign up information is provided on this After Visit Summary.  MyChart is used to connect with patients for Virtual Visits (Telemedicine).  Patients are able to view lab/test results, encounter notes, upcoming appointments, etc.  Non-urgent messages can be sent to your provider as well.   To learn more about what you can do with MyChart, go to [email or web address].    Your next appointment:   As needed  The format for your next appointment:   In Person  Provider:   Minus Breeding, MD

## 2020-05-17 ENCOUNTER — Other Ambulatory Visit: Payer: Self-pay

## 2020-05-17 ENCOUNTER — Ambulatory Visit (HOSPITAL_COMMUNITY): Payer: 59 | Attending: Cardiology

## 2020-05-17 ENCOUNTER — Ambulatory Visit (INDEPENDENT_AMBULATORY_CARE_PROVIDER_SITE_OTHER)
Admission: RE | Admit: 2020-05-17 | Discharge: 2020-05-17 | Disposition: A | Discharge status: Home / Self Care | Payer: Self-pay | Source: Ambulatory Visit | Attending: Cardiology | Admitting: Cardiology

## 2020-05-17 DIAGNOSIS — R0602 Shortness of breath: Secondary | ICD-10-CM

## 2020-05-17 DIAGNOSIS — I10 Essential (primary) hypertension: Secondary | ICD-10-CM | POA: Diagnosis not present

## 2020-05-17 DIAGNOSIS — E785 Hyperlipidemia, unspecified: Secondary | ICD-10-CM | POA: Insufficient documentation

## 2020-05-17 DIAGNOSIS — Z8249 Family history of ischemic heart disease and other diseases of the circulatory system: Secondary | ICD-10-CM | POA: Insufficient documentation

## 2020-05-17 DIAGNOSIS — I421 Obstructive hypertrophic cardiomyopathy: Secondary | ICD-10-CM | POA: Diagnosis present

## 2020-05-17 LAB — ECHOCARDIOGRAM COMPLETE
Area-P 1/2: 2.75 cm2
S' Lateral: 2.6 cm

## 2020-05-23 ENCOUNTER — Other Ambulatory Visit: Payer: Self-pay | Admitting: Family Medicine

## 2020-05-23 DIAGNOSIS — F411 Generalized anxiety disorder: Secondary | ICD-10-CM

## 2020-06-21 ENCOUNTER — Other Ambulatory Visit: Payer: Self-pay | Admitting: Nurse Practitioner

## 2020-07-11 ENCOUNTER — Encounter: Payer: Self-pay | Admitting: Family Medicine

## 2020-08-26 ENCOUNTER — Ambulatory Visit (INDEPENDENT_AMBULATORY_CARE_PROVIDER_SITE_OTHER): Payer: 59

## 2020-08-26 ENCOUNTER — Encounter: Payer: Self-pay | Admitting: Family Medicine

## 2020-08-26 ENCOUNTER — Ambulatory Visit (INDEPENDENT_AMBULATORY_CARE_PROVIDER_SITE_OTHER): Payer: 59 | Admitting: Family Medicine

## 2020-08-26 ENCOUNTER — Other Ambulatory Visit: Payer: Self-pay

## 2020-08-26 VITALS — BP 134/88 | HR 84 | Temp 97.9°F | Ht 71.0 in | Wt 187.0 lb

## 2020-08-26 DIAGNOSIS — M2550 Pain in unspecified joint: Secondary | ICD-10-CM | POA: Diagnosis not present

## 2020-08-26 DIAGNOSIS — M25541 Pain in joints of right hand: Secondary | ICD-10-CM

## 2020-08-26 DIAGNOSIS — R059 Cough, unspecified: Secondary | ICD-10-CM | POA: Diagnosis not present

## 2020-08-26 DIAGNOSIS — M25542 Pain in joints of left hand: Secondary | ICD-10-CM | POA: Diagnosis not present

## 2020-08-26 DIAGNOSIS — E890 Postprocedural hypothyroidism: Secondary | ICD-10-CM

## 2020-08-26 DIAGNOSIS — E782 Mixed hyperlipidemia: Secondary | ICD-10-CM | POA: Diagnosis not present

## 2020-08-26 MED ORDER — OMEPRAZOLE 20 MG PO CPDR: 20.0000 mg | DELAYED_RELEASE_CAPSULE | Freq: Every day | ORAL | 3 refills | Status: DC

## 2020-08-26 MED ORDER — ROSUVASTATIN CALCIUM 40 MG PO TABS: 40.0000 mg | ORAL_TABLET | Freq: Every day | ORAL | 3 refills | Status: DC

## 2020-08-26 MED ORDER — NAPROXEN 500 MG PO TABS
500.0000 mg | ORAL_TABLET | Freq: Two times a day (BID) | ORAL | 2 refills | Status: DC | PRN
Start: 2020-08-26 — End: 2020-12-08

## 2020-08-26 NOTE — Progress Notes (Signed)
 Subjective: CC: Follow-up hyperlipidemia, cough, arthritis PCP: ,  M, DO HPI:Kenneth [name] is a 54 y.o. male presenting to clinic today for:  1.  Polyarthralgia Patient reports that the arthritis is primarily in the hands.  He does sometimes have erythema and swelling of the joints.  He does work a lot with his hands but the pain seems to be independent of that.  He often wakes up with stiff joints all over in the morning time.  Symptoms were relieved by prednisone and Naprosyn when he saw the urgent care provider.  However, they have returned, albeit less severe than previous.  No reports of myalgia though he has increased Crestor to 40 mg daily and seems to be tolerating this.  He is currently taking Tylenol and Motrin for the joints  2.  Cough Patient with ongoing intermittent cough.  He occasionally takes Pepcid at bedtime for acid reflux but does not really take anything consistently.  No hemoptysis.  No shortness of breath or fevers.  3.  Anxiety Patient is taking only half a tablet of the alprazolam maybe 3 times per month.  He seems to be coping with the situational anxiety that he was experiencing by using earplugs or listening to music when in public.  He stopped the Lexapro and is doing well off of this.  He has had rare use of the Ambien and is primarily using melatonin only.  ROS: Per HPI  No Known Allergies Past Medical History:  Diagnosis Date  . DDD (degenerative disc disease), lumbar   . Hyperlipidemia   . Hypertension   . Hypothyroidism     Current Outpatient Medications:  .  ALPRAZolam (XANAX) 1 MG tablet, Take 0.5-1 tablets (0.5-1 mg total) by mouth 2 (two) times daily as needed for anxiety., Disp: 60 tablet, Rfl: 0 .  escitalopram (LEXAPRO) 20 MG tablet, Take 1 tablet (20 mg total) by mouth daily., Disp: 90 tablet, Rfl: 1 .  fenofibrate 160 MG tablet, TAKE 1 TABLET DAILY, Disp: 30 tablet, Rfl: 3 .  hydrocortisone (PROCTOZONE-HC) 2.5 % rectal  cream, Place 1 application rectally 2 (two) times daily., Disp: 30 g, Rfl: 0 .  levothyroxine (SYNTHROID) 150 MCG tablet, Take 1 tablet (150 mcg total) by mouth daily., Disp: 90 tablet, Rfl: 1 .  losartan-hydrochlorothiazide (HYZAAR) 50-12.5 MG tablet, Take 1 tablet by mouth daily., Disp: 90 tablet, Rfl: 3 .  rosuvastatin (CRESTOR) 20 MG tablet, Take 1 tablet (20 mg total) by mouth daily. (Patient taking differently: Take 20 mg by mouth 2 (two) times daily. ), Disp: 90 tablet, Rfl: 0 .  zolpidem (AMBIEN) 10 MG tablet, Take 0.5-1 tablets (5-10 mg total) by mouth at bedtime as needed for sleep., Disp: 30 tablet, Rfl: 3 Social History   Socioeconomic History  . Marital status: Married    Spouse name: Not on file  . Number of children: Not on file  . Years of education: Not on file  . Highest education level: Not on file  Occupational History  . Not on file  Tobacco Use  . Smoking status: Never Smoker  . Smokeless tobacco: Never Used  Vaping Use  . Vaping Use: Never used  Substance and Sexual Activity  . Alcohol use: No  . Drug use: No  . Sexual activity: Not on file  Other Topics Concern  . Not on file  Social History Narrative  . Not on file   Social Determinants of Health   Financial Resource Strain: Not on file    Food Insecurity: Not on file  Transportation Needs: Not on file  Physical Activity: Not on file  Stress: Not on file  Social Connections: Not on file  Intimate Partner Violence: Not on file   Family History  Problem Relation Age of Onset  . Diabetes Mother   . Hypertension Mother   . Stroke Mother   . Hypertrophic cardiomyopathy Mother   . Hyperlipidemia Father   . Hypertension Father   . Heart attack Father   . Diabetes Father     Objective: Office vital signs reviewed. BP 134/88   Pulse 84   Temp 97.9 F (36.6 C) (Temporal)   Ht 5' 11" (1.803 m)   Wt 187 lb (84.8 kg)   SpO2 98%   BMI 26.08 kg/m we  Physical Examination:  General: Awake, alert,  well nourished, No acute distress HEENT: Normal; sclera white Cardio: regular rate and rhythm, S1S2 heard, no murmurs appreciated Pulm: clear to auscultation bilaterally, no wheezes, rhonchi or rales; normal work of breathing on room air Extremities: warm, well perfused, No edema, cyanosis or clubbing; +2 pulses bilaterally MSK: normal gait and station; no gross joint deformity, swelling, deviation, erythema or warmth noted of the hands. Skin: dry; intact; no rashes or lesions Psych: Mood is stable, speech is normal, pleasant and interactive  Assessment/ Plan: 54 y.o. male   Polyarthralgia - Plan: Arthritis Panel, ANA w/Reflex if Positive, DG HAND 2 VIEWS BILAT, naproxen (NAPROSYN) 500 MG tablet, DG Hand 2 View Left, DG Hand 2 View Right  Mixed hyperlipidemia - Plan: CMP14+EGFR, rosuvastatin (CRESTOR) 40 MG tablet, Lipid Panel  Cough - Plan: omeprazole (PRILOSEC) 20 MG capsule  Postoperative hypothyroidism - Plan: Thyroid Panel With TSH  Possibly autoimmune mediated.  I am going to place him on an oral anti-inflammatory more consistently to see if this might alleviate.  I have also obtained x-rays of the hands to look for any erosive changes.  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  Fasting lipid panel was obtained after increase of statin.  LFTs also ordered.  Crestor reflected and prescribed.  I suspect the cough to be GI mediated.  His lung exam was totally unremarkable today and has had previous GERD issues.  Trial of PPI.  This has been prescribed.  I would like to follow-up in about a month to see how this is working, sooner if needed.  Cautioned use of NSAID as above, would certainly recommend consistent PPI while on NSAID   No orders of the defined types were placed in this encounter.  No orders of the defined types were placed in this encounter.     M  , DO [facility] [phone]   

## 2020-08-26 NOTE — Patient Instructions (Signed)
Starting omeprazole for presumed GI etiology of your cough. Take daily. We will plan to keep you on this for a month to see if cough improves. If so, we could consider continuing it. If not, would discontinue  I am also putting naproxen in for you. You may use this up to twice daily for hand pain. I would say that if you decide to keep this twice daily indefinitely I would stay on the omeprazole as this medicine can increase acid reflux.  I am ordering an arthritis panel to look for autoimmune mediated etiology of your arthritis of the hands. We also getting x-rays of the hands. I will contact you once these results are available

## 2020-08-27 LAB — LIPID PANEL
Chol/HDL Ratio: 5.3 ratio — ABNORMAL HIGH (ref 0.0–5.0)
Cholesterol, Total: 212 mg/dL — ABNORMAL HIGH (ref 100–199)
HDL: 40 mg/dL (ref >39)
LDL Chol Calc (NIH): 139 mg/dL — ABNORMAL HIGH (ref 0–99)
Triglycerides: 183 mg/dL — ABNORMAL HIGH (ref 0–149)
VLDL Cholesterol Cal: 33 mg/dL (ref 5–40)

## 2020-08-27 LAB — ARTHRITIS PANEL
Basophils Absolute: 0.1 10*3/uL (ref 0.0–0.2)
Basos: 1 %
EOS (ABSOLUTE): 0.2 10*3/uL (ref 0.0–0.4)
Eos: 1 %
Hematocrit: 45.5 % (ref 37.5–51.0)
Hemoglobin: 15.9 g/dL (ref 13.0–17.7)
Immature Grans (Abs): 0 10*3/uL (ref 0.0–0.1)
Immature Granulocytes: 0 %
Lymphocytes Absolute: 4 10*3/uL — ABNORMAL HIGH (ref 0.7–3.1)
Lymphs: 33 %
MCH: 29.9 pg (ref 26.6–33.0)
MCHC: 34.9 g/dL (ref 31.5–35.7)
MCV: 86 fL (ref 79–97)
Monocytes Absolute: 0.8 10*3/uL (ref 0.1–0.9)
Monocytes: 6 %
Neutrophils Absolute: 7.1 10*3/uL — ABNORMAL HIGH (ref 1.4–7.0)
Neutrophils: 59 %
Platelets: 337 10*3/uL (ref 150–450)
RBC: 5.31 x10E6/uL (ref 4.14–5.80)
RDW: 12.3 % (ref 11.6–15.4)
Rheumatoid fact SerPl-aCnc: <10 IU/mL (ref <14.0)
Sed Rate: 2 mm/hr (ref 0–30)
Uric Acid: 7.3 mg/dL (ref 3.8–8.4)
WBC: 12.2 10*3/uL — ABNORMAL HIGH (ref 3.4–10.8)

## 2020-08-27 LAB — CMP14+EGFR
ALT: 58 IU/L — ABNORMAL HIGH (ref 0–44)
AST: 38 IU/L (ref 0–40)
Albumin/Globulin Ratio: 1.8 (ref 1.2–2.2)
Albumin: 4.5 g/dL (ref 3.8–4.9)
Alkaline Phosphatase: 69 IU/L (ref 44–121)
BUN/Creatinine Ratio: 10 (ref 9–20)
BUN: 11 mg/dL (ref 6–24)
Bilirubin Total: 0.9 mg/dL (ref 0.0–1.2)
CO2: 24 mmol/L (ref 20–29)
Calcium: 9.5 mg/dL (ref 8.7–10.2)
Chloride: 101 mmol/L (ref 96–106)
Creatinine, Ser: 1.12 mg/dL (ref 0.76–1.27)
GFR calc Af Amer: 86 mL/min/{1.73_m2} (ref >59)
GFR calc non Af Amer: 75 mL/min/{1.73_m2} (ref >59)
Globulin, Total: 2.5 g/dL (ref 1.5–4.5)
Glucose: 80 mg/dL (ref 65–99)
Potassium: 4.3 mmol/L (ref 3.5–5.2)
Sodium: 139 mmol/L (ref 134–144)
Total Protein: 7 g/dL (ref 6.0–8.5)

## 2020-08-27 LAB — ANA W/REFLEX IF POSITIVE: Anti Nuclear Antibody (ANA): NEGATIVE

## 2020-08-27 LAB — THYROID PANEL WITH TSH
Free Thyroxine Index: 3.6 (ref 1.2–4.9)
T3 Uptake Ratio: 32 % (ref 24–39)
T4, Total: 11.2 ug/dL (ref 4.5–12.0)
TSH: 2.28 u[IU]/mL (ref 0.450–4.500)

## 2020-10-12 ENCOUNTER — Ambulatory Visit: Payer: 59 | Admitting: Family Medicine

## 2020-12-02 ENCOUNTER — Other Ambulatory Visit: Payer: Self-pay | Admitting: Family Medicine

## 2020-12-02 DIAGNOSIS — F5101 Primary insomnia: Secondary | ICD-10-CM

## 2020-12-08 ENCOUNTER — Ambulatory Visit (INDEPENDENT_AMBULATORY_CARE_PROVIDER_SITE_OTHER): Payer: 59 | Admitting: Family Medicine

## 2020-12-08 ENCOUNTER — Encounter: Payer: Self-pay | Admitting: Family Medicine

## 2020-12-08 ENCOUNTER — Other Ambulatory Visit: Payer: Self-pay

## 2020-12-08 VITALS — BP 119/82 | HR 81 | Temp 98.0°F | Ht 71.0 in | Wt 196.2 lb

## 2020-12-08 DIAGNOSIS — M159 Polyosteoarthritis, unspecified: Secondary | ICD-10-CM

## 2020-12-08 DIAGNOSIS — F411 Generalized anxiety disorder: Secondary | ICD-10-CM | POA: Diagnosis not present

## 2020-12-08 DIAGNOSIS — F5101 Primary insomnia: Secondary | ICD-10-CM

## 2020-12-08 DIAGNOSIS — E782 Mixed hyperlipidemia: Secondary | ICD-10-CM

## 2020-12-08 DIAGNOSIS — I10 Essential (primary) hypertension: Secondary | ICD-10-CM

## 2020-12-08 DIAGNOSIS — M150 Primary generalized (osteo)arthritis: Secondary | ICD-10-CM

## 2020-12-08 DIAGNOSIS — M8949 Other hypertrophic osteoarthropathy, multiple sites: Secondary | ICD-10-CM | POA: Diagnosis not present

## 2020-12-08 DIAGNOSIS — L723 Sebaceous cyst: Secondary | ICD-10-CM

## 2020-12-08 MED ORDER — ALPRAZOLAM 1 MG PO TABS: 0.5000 mg | ORAL_TABLET | Freq: Two times a day (BID) | ORAL | 0 refills | Status: DC | PRN

## 2020-12-08 MED ORDER — LOSARTAN POTASSIUM-HCTZ 50-12.5 MG PO TABS: 1.0000 | ORAL_TABLET | Freq: Every day | ORAL | 3 refills | Status: DC

## 2020-12-08 MED ORDER — ZOLPIDEM TARTRATE 10 MG PO TABS: 5.0000 mg | ORAL_TABLET | Freq: Every evening | ORAL | 3 refills | Status: DC | PRN

## 2020-12-08 NOTE — Patient Instructions (Signed)
we did increase your rosuvastatin to 40 mg daily in December because your LDL has doubled.  This may have been reflective of just not taking it regularly.  Let see what your cholesterol looks like today and we can decide whether or not to back down to 20 mg.  However, I think 40 mg pulse dosed to 3 times per week would likely be okay.

## 2020-12-08 NOTE — Progress Notes (Signed)
Subjective: CC: Follow-up hyperlipidemia PCP: Janora Norlander, DO JFH:[name] is a 55 y.o. male presenting to clinic today for:  1.  Hyperlipidemia with hypertension Patient is compliant with Hyzaar 50-12.5 mg daily.  He admits he only remembers the Crestor 2 to 3 days/week.  Currently taking 40 mg.  No chest pain, shortness of breath.  Cough has improved without use of PPI  2.  Anxiety, insomnia Intolerant to Lexapro.  This has since been discontinued.  He does admit to more stress at work and has been using half a tablet of Xanax 1 mg on a fairly regular basis with very rare afternoon Xanax dosing.  Denies any excessive daytime sedation, falls, respiratory depression, visual or auditory hallucinations.  Ambien use only 1-2 times per week.  Needs refills on both because they have expired  3.  OA Patient reports joint pain has gotten better.  The Naprosyn did not seem to make much difference and therefore he switched to Motrin.  He uses this as needed.  4.  Sebaceous cyst Patient was seen by his dermatologist recently who determined that the lesion on his buttock was a sebaceous cyst.  They recommended excision through general surgery but did not place referral.  He is asking for referral today.  Denies any warmth, enlargement or draining.  Cyst is palpable but not infected.   ROS: Per HPI  No Known Allergies Past Medical History:  Diagnosis Date  . DDD (degenerative disc disease), lumbar   . Hyperlipidemia   . Hypertension   . Hypothyroidism     Current Outpatient Medications:  .  ALPRAZolam (XANAX) 1 MG tablet, Take 0.5-1 tablets (0.5-1 mg total) by mouth 2 (two) times daily as needed for anxiety., Disp: 60 tablet, Rfl: 0 .  fenofibrate 160 MG tablet, TAKE 1 TABLET DAILY, Disp: 30 tablet, Rfl: 3 .  hydrocortisone (ANUSOL-HC) 2.5 % rectal cream, Place 1 application rectally 2 (two) times daily., Disp: 30 g, Rfl: 0 .  levothyroxine (SYNTHROID) 150 MCG tablet, Take 1  tablet (150 mcg total) by mouth daily., Disp: 90 tablet, Rfl: 1 .  losartan-hydrochlorothiazide (HYZAAR) 50-12.5 MG tablet, Take 1 tablet by mouth daily., Disp: 90 tablet, Rfl: 3 .  naproxen (NAPROSYN) 500 MG tablet, Take 1 tablet (500 mg total) by mouth 2 (two) times daily as needed for moderate pain (take with food)., Disp: 60 tablet, Rfl: 2 .  omeprazole (PRILOSEC) 20 MG capsule, Take 1 capsule (20 mg total) by mouth daily., Disp: 30 capsule, Rfl: 3 .  rosuvastatin (CRESTOR) 40 MG tablet, Take 1 tablet (40 mg total) by mouth daily., Disp: 90 tablet, Rfl: 3 .  zolpidem (AMBIEN) 10 MG tablet, Take 0.5-1 tablets (5-10 mg total) by mouth at bedtime as needed for sleep., Disp: 30 tablet, Rfl: 3 Social History   Socioeconomic History  . Marital status: Married    Spouse name: Not on file  . Number of children: Not on file  . Years of education: Not on file  . Highest education level: Not on file  Occupational History  . Not on file  Tobacco Use  . Smoking status: Never Smoker  . Smokeless tobacco: Never Used  Vaping Use  . Vaping Use: Never used  Substance and Sexual Activity  . Alcohol use: No  . Drug use: No  . Sexual activity: Not on file  Other Topics Concern  . Not on file  Social History Narrative  . Not on file   Social Determinants of  Health   Financial Resource Strain: Not on file  Food Insecurity: Not on file  Transportation Needs: Not on file  Physical Activity: Not on file  Stress: Not on file  Social Connections: Not on file  Intimate Partner Violence: Not on file   Family History  Problem Relation Age of Onset  . Diabetes Mother   . Hypertension Mother   . Stroke Mother   . Hypertrophic cardiomyopathy Mother   . Hyperlipidemia Father   . Hypertension Father   . Heart attack Father   . Diabetes Father     Objective: Office vital signs reviewed. BP 119/82   Pulse 81   Temp 98 F (36.7 C) (Temporal)   Ht '5\' 11"'  (1.803 m)   Wt 196 lb 3.2 oz (89 kg)    SpO2 99%   BMI 27.36 kg/m   Physical Examination:  General: Awake, alert, well nourished, No acute distress HEENT: Normal; sclera white.  Moist mucous membranes Cardio: regular rate and rhythm, S1S2 heard, no murmurs appreciated Pulm: clear to auscultation bilaterally, no wheezes, rhonchi or rales; normal work of breathing on room air MSK: normal gait and station Skin: dry; intact; no rashes or lesions Neuro: No tremor Psych: Affect flat but mood stable.  Patient is pleasant and interactive.  Depression screen [facility] 2/9 12/08/2020 08/26/2020 04/20/2020  Decreased Interest 0 0 0  Down, Depressed, Hopeless 0 0 0  PHQ - 2 Score 0 0 0  Altered sleeping - 0 1  Tired, decreased energy - 0 0  Change in appetite - 0 0  Feeling bad or failure about yourself  - 0 0  Trouble concentrating - 0 0  Moving slowly or fidgety/restless - 0 0  Suicidal thoughts - 0 0  PHQ-9 Score - 0 1  Difficult doing work/chores - - Not difficult at all  Some recent data might be hidden     Assessment/ Plan: 55 y.o. male   Mixed hyperlipidemia - Plan: CMP14+EGFR, Lipid Panel  Primary osteoarthritis involving multiple joints  GAD (generalized anxiety disorder) - Plan: ALPRAZolam (XANAX) 1 MG tablet  Primary insomnia - Plan: zolpidem (AMBIEN) 10 MG tablet  Essential hypertension - Plan: losartan-hydrochlorothiazide (HYZAAR) 50-12.5 MG tablet  Sebaceous cyst - Plan: Ambulatory referral to General Surgery  Check lipid panel.  I suspect we will continue pulse dosing with Crestor 40 mg 2 to 3 days/week since he is having difficulty remembering the medication  OA of the joints have improved.  He is using as needed Motrin.  Naproxen was equivalent to this.  Anxiety is stable.  Cautioned regular use of the Xanax as he will need to taper from this once situational issues have resolved.  Ambien use is seldom and perhaps only a couple of times per week.  No red flags.-Narcotic database was reviewed.  He is  up-to-date on drug screening and CSC as per office policy  Blood pressures well controlled.  Continue current regimen  Referral to general surgery for excision of sebaceous cyst placed  No orders of the defined types were placed in this encounter.  No orders of the defined types were placed in this encounter.    Janora Norlander, DO [place] [phone]

## 2020-12-09 LAB — CMP14+EGFR
ALT: 61 IU/L — ABNORMAL HIGH (ref 0–44)
AST: 33 IU/L (ref 0–40)
Albumin/Globulin Ratio: 1.6 (ref 1.2–2.2)
Albumin: 4.2 g/dL (ref 3.8–4.9)
Alkaline Phosphatase: 92 IU/L (ref 44–121)
BUN/Creatinine Ratio: 10 (ref 9–20)
BUN: 12 mg/dL (ref 6–24)
Bilirubin Total: 0.6 mg/dL (ref 0.0–1.2)
CO2: 22 mmol/L (ref 20–29)
Calcium: 9.2 mg/dL (ref 8.7–10.2)
Chloride: 103 mmol/L (ref 96–106)
Creatinine, Ser: 1.22 mg/dL (ref 0.76–1.27)
Globulin, Total: 2.6 g/dL (ref 1.5–4.5)
Glucose: 96 mg/dL (ref 65–99)
Potassium: 4.7 mmol/L (ref 3.5–5.2)
Sodium: 140 mmol/L (ref 134–144)
Total Protein: 6.8 g/dL (ref 6.0–8.5)
eGFR: 71 mL/min/{1.73_m2} (ref >59)

## 2020-12-09 LAB — LIPID PANEL
Chol/HDL Ratio: 7.2 ratio — ABNORMAL HIGH (ref 0.0–5.0)
Cholesterol, Total: 236 mg/dL — ABNORMAL HIGH (ref 100–199)
HDL: 33 mg/dL — ABNORMAL LOW (ref >39)
LDL Chol Calc (NIH): 139 mg/dL — ABNORMAL HIGH (ref 0–99)
Triglycerides: 350 mg/dL — ABNORMAL HIGH (ref 0–149)
VLDL Cholesterol Cal: 64 mg/dL — ABNORMAL HIGH (ref 5–40)

## 2020-12-22 ENCOUNTER — Other Ambulatory Visit: Payer: Self-pay

## 2020-12-22 ENCOUNTER — Encounter: Payer: Self-pay | Admitting: General Surgery

## 2020-12-22 ENCOUNTER — Ambulatory Visit (INDEPENDENT_AMBULATORY_CARE_PROVIDER_SITE_OTHER): Payer: 59 | Admitting: General Surgery

## 2020-12-22 VITALS — BP 126/87 | HR 78 | Temp 97.3°F | Resp 14 | Ht 71.0 in | Wt 197.0 lb

## 2020-12-22 DIAGNOSIS — L723 Sebaceous cyst: Secondary | ICD-10-CM | POA: Diagnosis not present

## 2020-12-22 NOTE — Patient Instructions (Signed)
Epidermoid Cyst  An epidermoid cyst, also called an epidermal cyst, is a small lump under your skin. The cyst contains a substance called keratin. Do not try to pop or open the cyst yourself. What are the causes?  A blocked hair follicle.  A hair that curls and re-enters the skin instead of growing straight out of the skin.  A blocked pore.  Irritated skin.  An injury to the skin.  Certain conditions that are passed along from parent to child.  Human papillomavirus (HPV). This happens rarely when cysts occur on the bottom of the feet.  Long-term sun damage to the skin. What increases the risk?  Having acne.  Being male.  Having an injury to the skin.  Being past puberty.  Having certain conditions caused by genes (genetic disorder) What are the signs or symptoms? These cysts are usually harmless, but they can get infected. Symptoms of infection may include:  Redness.  Inflammation.  Tenderness.  Warmth.  Fever.  A bad-smelling substance that drains from the cyst.  Pus that drains from the cyst. How is this treated? In many cases, epidermoid cysts go away on their own without treatment. If a cyst becomes infected, treatment may include:  Opening and draining the cyst, done by a doctor. After draining, you may need minor surgery to remove the rest of the cyst.  Antibiotic medicine.  Shots of medicines (steroids) that help to reduce inflammation.  Surgery to remove the cyst. Surgery may be done if the cyst: ? Becomes large. ? Bothers you. ? Has a chance of turning into cancer.  Do not try to open a cyst yourself. Follow these instructions at home: Medicines  Take over-the-counter and prescription medicines as told by your doctor.  If you were prescribed an antibiotic medicine, take it as told by your doctor. Do not stop taking it even if you start to feel better. General instructions  Keep the area around your cyst clean and dry.  Wear loose, dry  clothing.  Avoid touching your cyst.  Check your cyst every day for signs of infection. Check for: ? Redness, swelling, or pain. ? Fluid or blood. ? Warmth. ? Pus or a bad smell.  Keep all follow-up visits. How is this prevented?  Wear clean, dry, clothing.  Avoid wearing tight clothing.  Keep your skin clean and dry. Take showers or baths every day. Contact a doctor if:  Your cyst has symptoms of infection.  Your condition does not improve or gets worse.  You have a cyst that looks different from other cysts you have had.  You have a fever. Get help right away if:  Redness spreads from the cyst into the area close by. Summary  An epidermoid cyst is a small lump under your skin.  If a cyst becomes infected, treatment may include surgery to open and drain the cyst, or to remove it.  Take over-the-counter and prescription medicines only as told by your doctor.  Contact a doctor if your condition is not improving or is getting worse.  Keep all follow-up visits. This information is not intended to replace advice given to you by your health care provider. Make sure you discuss any questions you have with your health care provider. Document Revised: 12/02/2019 Document Reviewed: 12/02/2019 Elsevier Patient Education  2021 Elsevier Inc.  

## 2020-12-22 NOTE — Progress Notes (Signed)
Kenneth [name]; [id number]; Jul 28, 1966   HPI Patient is a 55 year old white male who was referred to my care by Kenneth [name] for evaluation and treatment of a recurrent sebaceous cyst on his left buttock.  He has had it present for many years.  It intermittently swells and drains.  He has been treated with antibiotics in the past.  His last episode was in December 2021.  A persistent cyst is present and is bothersome when pressure is applied. Past Medical History:  Diagnosis Date  . DDD (degenerative disc disease), lumbar   . Hyperlipidemia   . Hypertension   . Hypothyroidism     Past Surgical History:  Procedure Laterality Date  . BACK SURGERY     disectomy  . THYROIDECTOMY  2010    Family History  Problem Relation Age of Onset  . Diabetes Mother   . Hypertension Mother   . Stroke Mother   . Hypertrophic cardiomyopathy Mother   . Hyperlipidemia Father   . Hypertension Father   . Heart attack Father   . Diabetes Father     Current Outpatient Medications on File Prior to Visit  Medication Sig Dispense Refill  . ALPRAZolam (XANAX) 1 MG tablet Take 0.5-1 tablets (0.5-1 mg total) by mouth 2 (two) times daily as needed for anxiety. 60 tablet 0  . fenofibrate 160 MG tablet TAKE 1 TABLET DAILY 30 tablet 3  . hydrocortisone (ANUSOL-HC) 2.5 % rectal cream Place 1 application rectally 2 (two) times daily. 30 g 0  . ibuprofen (ADVIL) 200 MG tablet Take 40 mg by mouth every 6 (six) hours as needed.    Marland Kitchen levothyroxine (SYNTHROID) 150 MCG tablet Take 1 tablet (150 mcg total) by mouth daily. 90 tablet 1  . losartan-hydrochlorothiazide (HYZAAR) 50-12.5 MG tablet Take 1 tablet by mouth daily. 90 tablet 3  . rosuvastatin (CRESTOR) 40 MG tablet Take 1 tablet (40 mg total) by mouth daily. 90 tablet 3  . zolpidem (AMBIEN) 10 MG tablet Take 0.5-1 tablets (5-10 mg total) by mouth at bedtime as needed for sleep. 30 tablet 3   No current facility-administered medications on file prior to  visit.    No Known Allergies  Social History   Substance and Sexual Activity  Alcohol Use No    Social History   Tobacco Use  Smoking Status Never Smoker  Smokeless Tobacco Never Used    Review of Systems  Constitutional: Negative.   HENT: Negative.   Eyes: Negative.   Respiratory: Negative.   Cardiovascular: Negative.   Gastrointestinal: Negative.   Genitourinary: Negative.   Musculoskeletal: Positive for back pain, joint pain and neck pain.  Skin: Negative.   Neurological: Negative.   Endo/Heme/Allergies: Negative.   Psychiatric/Behavioral: Negative.     Objective   Vitals:   12/22/20 1117  BP: 126/87  Pulse: 78  Resp: 14  Temp: (!) 97.3 F (36.3 C)  SpO2: 97%    Physical Exam Vitals reviewed.  Constitutional:      Appearance: Normal appearance. He is normal weight. He is not ill-appearing.  HENT:     Head: Normocephalic and atraumatic.  Cardiovascular:     Rate and Rhythm: Normal rate and regular rhythm.     Heart sounds: Normal heart sounds. No murmur heard. No friction rub. No gallop.   Pulmonary:     Effort: Pulmonary effort is normal. No respiratory distress.     Breath sounds: Normal breath sounds. No stridor. No wheezing, rhonchi or rales.  Skin:  General: Skin is warm and dry.     Comments: 1.5 cm subcutaneous nodule which is oval in nature in the left buttock region.  No drainage or erythema is noted.  It is somewhat tender to touch with deep palpation.  Neurological:     Mental Status: He is alert and oriented to person, place, and time.     Assessment  Sebaceous cyst, left buttock Plan   Patient is scheduled for excision of the sebaceous cyst, left buttock on 01/06/2021.  The risks and benefits of the procedure were fully explained to the patient, who gave informed consent.

## 2020-12-22 NOTE — H&P (Signed)
Kenneth [name]; [id number]; [birth year]   HPI Patient is a 55 year old white male who was referred to my care by [name] for evaluation and treatment of a recurrent sebaceous cyst on his left buttock.  He has had it present for many years.  It intermittently swells and drains.  He has been treated with antibiotics in the past.  His last episode was in December 2021.  A persistent cyst is present and is bothersome when pressure is applied. Past Medical History:  Diagnosis Date  . DDD (degenerative disc disease), lumbar   . Hyperlipidemia   . Hypertension   . Hypothyroidism     Past Surgical History:  Procedure Laterality Date  . BACK SURGERY     disectomy  . THYROIDECTOMY  2010    Family History  Problem Relation Age of Onset  . Diabetes Mother   . Hypertension Mother   . Stroke Mother   . Hypertrophic cardiomyopathy Mother   . Hyperlipidemia Father   . Hypertension Father   . Heart attack Father   . Diabetes Father     Current Outpatient Medications on File Prior to Visit  Medication Sig Dispense Refill  . ALPRAZolam (XANAX) 1 MG tablet Take 0.5-1 tablets (0.5-1 mg total) by mouth 2 (two) times daily as needed for anxiety. 60 tablet 0  . fenofibrate 160 MG tablet TAKE 1 TABLET DAILY 30 tablet 3  . hydrocortisone (ANUSOL-HC) 2.5 % rectal cream Place 1 application rectally 2 (two) times daily. 30 g 0  . ibuprofen (ADVIL) 200 MG tablet Take 40 mg by mouth every 6 (six) hours as needed.    Marland Kitchen levothyroxine (SYNTHROID) 150 MCG tablet Take 1 tablet (150 mcg total) by mouth daily. 90 tablet 1  . losartan-hydrochlorothiazide (HYZAAR) 50-12.5 MG tablet Take 1 tablet by mouth daily. 90 tablet 3  . rosuvastatin (CRESTOR) 40 MG tablet Take 1 tablet (40 mg total) by mouth daily. 90 tablet 3  . zolpidem (AMBIEN) 10 MG tablet Take 0.5-1 tablets (5-10 mg total) by mouth at bedtime as needed for sleep. 30 tablet 3   No current facility-administered medications on file prior to  visit.    No Known Allergies  Social History   Substance and Sexual Activity  Alcohol Use No    Social History   Tobacco Use  Smoking Status Never Smoker  Smokeless Tobacco Never Used    Review of Systems  Constitutional: Negative.   HENT: Negative.   Eyes: Negative.   Respiratory: Negative.   Cardiovascular: Negative.   Gastrointestinal: Negative.   Genitourinary: Negative.   Musculoskeletal: Positive for back pain, joint pain and neck pain.  Skin: Negative.   Neurological: Negative.   Endo/Heme/Allergies: Negative.   Psychiatric/Behavioral: Negative.     Objective   Vitals:   12/22/20 1117  BP: 126/87  Pulse: 78  Resp: 14  Temp: (!) 97.3 F (36.3 C)  SpO2: 97%    Physical Exam Vitals reviewed.  Constitutional:      Appearance: Normal appearance. He is normal weight. He is not ill-appearing.  HENT:     Head: Normocephalic and atraumatic.  Cardiovascular:     Rate and Rhythm: Normal rate and regular rhythm.     Heart sounds: Normal heart sounds. No murmur heard. No friction rub. No gallop.   Pulmonary:     Effort: Pulmonary effort is normal. No respiratory distress.     Breath sounds: Normal breath sounds. No stridor. No wheezing, rhonchi or rales.  Skin:  General: Skin is warm and dry.     Comments: 1.5 cm subcutaneous nodule which is oval in nature in the left buttock region.  No drainage or erythema is noted.  It is somewhat tender to touch with deep palpation.  Neurological:     Mental Status: He is alert and oriented to person, place, and time.     Assessment  Sebaceous cyst, left buttock Plan   Patient is scheduled for excision of the sebaceous cyst, left buttock on 01/06/2021.  The risks and benefits of the procedure were fully explained to the patient, who gave informed consent.

## 2021-01-03 NOTE — Patient Instructions (Signed)
Kenneth [name]  01/03/2021     @PREFPERIOPPHARMACY @   Your procedure is scheduled on  01/06/2021.   Report to [facility] at  [phone]  A.M.   Call this number if you have problems the morning of surgery:  [phone]   Remember:  Do not eat or drink after midnight.                        Take these medicines the morning of surgery with A SIP OF WATER  Xanax (if needed), levothyroxine.   Place clean sheets on your bed the night before your surgery and DO NOT sleep with pets this night.  Shower with CHG the night before and the morning of your procedure. DO NOT put CHG on your face, hair or genitals.  After each shower, dry off with a clean towel, put on clean, comfortable clothes and brush your teeth.      Do not wear jewelry, make-up or nail polish.  Do not wear lotions, powders, or perfumes, or deodorant.  Do not shave 48 hours prior to surgery.  Men may shave face and neck.  Do not bring valuables to the hospital.  [facility] is not responsible for any belongings or valuables.  Contacts, dentures or bridgework may not be worn into surgery.  Leave your suitcase in the car.  After surgery it may be brought to your room.  For patients admitted to the hospital, discharge time will be determined by your treatment team.  Patients discharged the day of surgery will not be allowed to drive home and must have someone with them for 24 hours.   Special instructions:  DO NOT smoke tobacco or vape for 24 hours before your procedure.  Please read over the following fact sheets that you were given. Coughing and Deep Breathing, Surgical Site Infection Prevention, Anesthesia Post-op Instructions and Care and Recovery After Surgery       Epidermoid Cyst Removal, Care After This sheet gives you information about how to care for yourself after your procedure. Your health care provider may also give you more specific instructions. If you have problems or questions, contact  your health care provider. What can I expect after the procedure? After the procedure, it is common to have:  Soreness in the area where your cyst was removed.  Tightness or itchiness from the stitches (sutures) in your skin. Follow these instructions at home: Medicines  Take over-the-counter and prescription medicines only as told by your health care provider.  If you were prescribed an antibiotic medicine or ointment, take or apply it as told by your health care provider. Do not stop using the antibiotic even if you start to feel better. Incision care  Follow instructions from your health care provider about how to take care of your incision. Make sure you: ? Wash your hands with soap and water for at least 20 seconds before you change your bandage (dressing). If soap and water are not available, use hand sanitizer. ? Change your dressing as told by your health care provider. ? Leave sutures, skin glue, or adhesive strips in place. These skin closures may need to stay in place for 1-2 weeks or longer. If adhesive strip edges start to loosen and curl up, you may trim the loose edges. Do not remove adhesive strips completely unless your health care provider tells you to do that.  Keep the dressing dry until your health  care provider says that it can be removed.  After your dressing is off, check your incision area every day for signs of infection. Check for: ? Redness, swelling, or pain. ? Fluid or blood. ? Warmth. ? Pus or a bad smell.   General instructions  Do not take baths, swim, or use a hot tub until your health care provider approves. Ask your health care provider if you may take showers. You may only be allowed to take sponge baths.  Your health care provider may ask you to avoid contact sports or activities that take a lot of effort. Do not do anything that stretches or puts pressure on your incision.  You can return to your normal diet.  Keep all follow-up visits. This  is important. Contact a health care provider if:  You have a fever.  You have redness, swelling, or pain in the incision area.  You have fluid or blood coming from your incision.  You have pus or a bad smell coming from your incision.  Your incision feels warm to the touch.  Your cyst grows back. Get help right away if:  If the incision site suddenly increases in size and you have pain at the incision site. You may be checked for a collection of blood under the skin from the procedure (hematoma). Summary  After the procedure, it is common to have soreness in the area where your cyst was removed.  Take or apply over-the-counter and prescription medicines only as told by your health care provider.  Follow instructions from your health care provider about how to take care of your incision. This information is not intended to replace advice given to you by your health care provider. Make sure you discuss any questions you have with your health care provider. Document Revised: 12/02/2019 Document Reviewed: 12/02/2019 Elsevier Patient Education  [place] Anesthesia, Adult, Care After This sheet gives you information about how to care for yourself after your procedure. Your health care provider may also give you more specific instructions. If you have problems or questions, contact your health care provider. What can I expect after the procedure? After the procedure, the following side effects are common:  Pain or discomfort at the IV site.  Nausea.  Vomiting.  Sore throat.  Trouble concentrating.  Feeling cold or chills.  Feeling weak or tired.  Sleepiness and fatigue.  Soreness and body aches. These side effects can affect parts of the body that were not involved in surgery. Follow these instructions at home: For the time period you were told by your health care provider:  Rest.  Do not participate in activities where you could fall or become  injured.  Do not drive or use machinery.  Do not drink alcohol.  Do not take sleeping pills or medicines that cause drowsiness.  Do not make important decisions or sign legal documents.  Do not take care of children on your own.   Eating and drinking  Follow any instructions from your health care provider about eating or drinking restrictions.  When you feel hungry, start by eating small amounts of foods that are soft and easy to digest (bland), such as toast. Gradually return to your regular diet.  Drink enough fluid to keep your urine pale yellow.  If you vomit, rehydrate by drinking water, juice, or clear broth. General instructions  If you have sleep apnea, surgery and certain medicines can increase your risk for breathing problems. Follow instructions from your health  care provider about wearing your sleep device: ? Anytime you are sleeping, including during daytime naps. ? While taking prescription pain medicines, sleeping medicines, or medicines that make you drowsy.  Have a responsible adult stay with you for the time you are told. It is important to have someone help care for you until you are awake and alert.  Return to your normal activities as told by your health care provider. Ask your health care provider what activities are safe for you.  Take over-the-counter and prescription medicines only as told by your health care provider.  If you smoke, do not smoke without supervision.  Keep all follow-up visits as told by your health care provider. This is important. Contact a health care provider if:  You have nausea or vomiting that does not get better with medicine.  You cannot eat or drink without vomiting.  You have pain that does not get better with medicine.  You are unable to pass urine.  You develop a skin rash.  You have a fever.  You have redness around your IV site that gets worse. Get help right away if:  You have difficulty breathing.  You  have chest pain.  You have blood in your urine or stool, or you vomit blood. Summary  After the procedure, it is common to have a sore throat or nausea. It is also common to feel tired.  Have a responsible adult stay with you for the time you are told. It is important to have someone help care for you until you are awake and alert.  When you feel hungry, start by eating small amounts of foods that are soft and easy to digest (bland), such as toast. Gradually return to your regular diet.  Drink enough fluid to keep your urine pale yellow.  Return to your normal activities as told by your health care provider. Ask your health care provider what activities are safe for you. This information is not intended to replace advice given to you by your health care provider. Make sure you discuss any questions you have with your health care provider. Document Revised: 05/12/2020 Document Reviewed: 12/10/2019 Elsevier Patient Education  2021 [employer].

## 2021-01-04 ENCOUNTER — Encounter (HOSPITAL_COMMUNITY)
Admission: RE | Admit: 2021-01-04 | Discharge: 2021-01-04 | Disposition: A | Discharge status: Home / Self Care | Payer: 59 | Source: Ambulatory Visit | Attending: General Surgery | Admitting: General Surgery

## 2021-01-04 ENCOUNTER — Other Ambulatory Visit: Payer: Self-pay

## 2021-01-04 ENCOUNTER — Encounter (HOSPITAL_COMMUNITY): Payer: Self-pay

## 2021-01-04 ENCOUNTER — Other Ambulatory Visit (HOSPITAL_COMMUNITY)
Admission: RE | Admit: 2021-01-04 | Discharge: 2021-01-04 | Disposition: A | Discharge status: Home / Self Care | Payer: 59 | Source: Ambulatory Visit | Attending: General Surgery | Admitting: General Surgery

## 2021-01-04 DIAGNOSIS — Z20822 Contact with and (suspected) exposure to covid-19: Secondary | ICD-10-CM | POA: Diagnosis not present

## 2021-01-04 DIAGNOSIS — Z01812 Encounter for preprocedural laboratory examination: Secondary | ICD-10-CM | POA: Insufficient documentation

## 2021-01-04 HISTORY — DX: Other complications of anesthesia, initial encounter: T88.59XA

## 2021-01-05 LAB — SARS CORONAVIRUS 2 (TAT 6-24 HRS): SARS Coronavirus 2: NEGATIVE

## 2021-01-06 ENCOUNTER — Ambulatory Visit (HOSPITAL_COMMUNITY): Payer: 59 | Admitting: Anesthesiology

## 2021-01-06 ENCOUNTER — Encounter (HOSPITAL_COMMUNITY)
Admission: RE | Disposition: A | Discharge status: Home / Self Care | Payer: Self-pay | Source: Ambulatory Visit | Attending: General Surgery

## 2021-01-06 ENCOUNTER — Ambulatory Visit (HOSPITAL_COMMUNITY)
Admission: RE | Admit: 2021-01-06 | Discharge: 2021-01-06 | Disposition: A | Discharge status: Home / Self Care | Payer: 59 | Source: Ambulatory Visit | Attending: General Surgery | Admitting: General Surgery

## 2021-01-06 DIAGNOSIS — L720 Epidermal cyst: Secondary | ICD-10-CM | POA: Diagnosis not present

## 2021-01-06 DIAGNOSIS — Z79890 Hormone replacement therapy (postmenopausal): Secondary | ICD-10-CM | POA: Insufficient documentation

## 2021-01-06 DIAGNOSIS — Z79899 Other long term (current) drug therapy: Secondary | ICD-10-CM | POA: Insufficient documentation

## 2021-01-06 HISTORY — PX: MASS EXCISION: SHX2000

## 2021-01-06 SURGERY — EXCISION MASS
Anesthesia: General | Site: Buttocks | Laterality: Left

## 2021-01-06 MED ORDER — EPHEDRINE SULFATE 50 MG/ML IJ SOLN
INTRAMUSCULAR | Status: DC | PRN
  Administered 2021-01-06 (×2): 10 mg via INTRAVENOUS

## 2021-01-06 MED ORDER — PROPOFOL 10 MG/ML IV BOLUS
INTRAVENOUS | Status: AC
  Filled 2021-01-06: qty 20

## 2021-01-06 MED ORDER — CEFAZOLIN SODIUM-DEXTROSE 2-4 GM/100ML-% IV SOLN
2.0000 g | INTRAVENOUS | Status: AC
  Administered 2021-01-06: 2 g via INTRAVENOUS
  Filled 2021-01-06: qty 100

## 2021-01-06 MED ORDER — TRAMADOL HCL 50 MG PO TABS: 50.0000 mg | ORAL_TABLET | Freq: Four times a day (QID) | ORAL | 0 refills | Status: DC | PRN

## 2021-01-06 MED ORDER — BUPIVACAINE HCL (PF) 0.5 % IJ SOLN
INTRAMUSCULAR | Status: DC | PRN
  Administered 2021-01-06: 5 mL

## 2021-01-06 MED ORDER — POVIDONE-IODINE 10 % EX OINT
TOPICAL_OINTMENT | CUTANEOUS | Status: AC
  Filled 2021-01-06: qty 1

## 2021-01-06 MED ORDER — PROPOFOL 10 MG/ML IV BOLUS
INTRAVENOUS | Status: DC | PRN
  Administered 2021-01-06: 250 mg via INTRAVENOUS

## 2021-01-06 MED ORDER — ONDANSETRON HCL 4 MG/2ML IJ SOLN
INTRAMUSCULAR | Status: AC
  Filled 2021-01-06: qty 2

## 2021-01-06 MED ORDER — DEXMEDETOMIDINE (PRECEDEX) IN NS 20 MCG/5ML (4 MCG/ML) IV SYRINGE
PREFILLED_SYRINGE | INTRAVENOUS | Status: DC | PRN
  Administered 2021-01-06: 20 ug via INTRAVENOUS

## 2021-01-06 MED ORDER — 0.9 % SODIUM CHLORIDE (POUR BTL) OPTIME
TOPICAL | Status: DC | PRN
  Administered 2021-01-06: 1000 mL

## 2021-01-06 MED ORDER — LACTATED RINGERS IV SOLN: INTRAVENOUS | Status: DC

## 2021-01-06 MED ORDER — FENTANYL CITRATE (PF) 100 MCG/2ML IJ SOLN
INTRAMUSCULAR | Status: DC | PRN
  Administered 2021-01-06: 100 ug via INTRAVENOUS

## 2021-01-06 MED ORDER — CHLORHEXIDINE GLUCONATE CLOTH 2 % EX PADS: 6.0000 | MEDICATED_PAD | Freq: Once | CUTANEOUS | Status: DC

## 2021-01-06 MED ORDER — ONDANSETRON HCL 4 MG/2ML IJ SOLN: 4.0000 mg | Freq: Once | INTRAMUSCULAR | Status: DC | PRN

## 2021-01-06 MED ORDER — LIDOCAINE HCL (CARDIAC) PF 100 MG/5ML IV SOSY
PREFILLED_SYRINGE | INTRAVENOUS | Status: DC | PRN
  Administered 2021-01-06: 60 mg via INTRAVENOUS

## 2021-01-06 MED ORDER — CHLORHEXIDINE GLUCONATE 0.12 % MT SOLN
15.0000 mL | Freq: Once | OROMUCOSAL | Status: AC
  Administered 2021-01-06: 15 mL via OROMUCOSAL

## 2021-01-06 MED ORDER — DEXAMETHASONE SODIUM PHOSPHATE 10 MG/ML IJ SOLN
INTRAMUSCULAR | Status: AC
  Filled 2021-01-06: qty 1

## 2021-01-06 MED ORDER — MEPERIDINE HCL 50 MG/ML IJ SOLN: 6.2500 mg | INTRAMUSCULAR | Status: DC | PRN

## 2021-01-06 MED ORDER — DEXMEDETOMIDINE (PRECEDEX) IN NS 20 MCG/5ML (4 MCG/ML) IV SYRINGE
PREFILLED_SYRINGE | INTRAVENOUS | Status: AC
  Filled 2021-01-06: qty 5

## 2021-01-06 MED ORDER — KETOROLAC TROMETHAMINE 30 MG/ML IJ SOLN
30.0000 mg | Freq: Once | INTRAMUSCULAR | Status: AC
  Administered 2021-01-06: 30 mg via INTRAVENOUS
  Filled 2021-01-06: qty 1

## 2021-01-06 MED ORDER — MIDAZOLAM HCL 2 MG/2ML IJ SOLN
INTRAMUSCULAR | Status: AC
  Filled 2021-01-06: qty 2

## 2021-01-06 MED ORDER — LIDOCAINE HCL (PF) 2 % IJ SOLN
INTRAMUSCULAR | Status: AC
  Filled 2021-01-06: qty 5

## 2021-01-06 MED ORDER — FENTANYL CITRATE (PF) 100 MCG/2ML IJ SOLN
INTRAMUSCULAR | Status: AC
  Filled 2021-01-06: qty 2

## 2021-01-06 MED ORDER — FENTANYL CITRATE (PF) 100 MCG/2ML IJ SOLN: 25.0000 ug | INTRAMUSCULAR | Status: DC | PRN

## 2021-01-06 MED ORDER — ONDANSETRON HCL 4 MG/2ML IJ SOLN
INTRAMUSCULAR | Status: DC | PRN
  Administered 2021-01-06: 4 mg via INTRAVENOUS

## 2021-01-06 MED ORDER — PHENYLEPHRINE 40 MCG/ML (10ML) SYRINGE FOR IV PUSH (FOR BLOOD PRESSURE SUPPORT)
PREFILLED_SYRINGE | INTRAVENOUS | Status: AC
  Filled 2021-01-06: qty 20

## 2021-01-06 MED ORDER — PHENYLEPHRINE HCL (PRESSORS) 10 MG/ML IV SOLN
INTRAVENOUS | Status: DC | PRN
  Administered 2021-01-06 (×4): 200 ug via INTRAVENOUS

## 2021-01-06 MED ORDER — MIDAZOLAM HCL 5 MG/5ML IJ SOLN
INTRAMUSCULAR | Status: DC | PRN
  Administered 2021-01-06: 2 mg via INTRAVENOUS

## 2021-01-06 MED ORDER — ORAL CARE MOUTH RINSE: 15.0000 mL | Freq: Once | OROMUCOSAL | Status: AC

## 2021-01-06 MED ORDER — BUPIVACAINE HCL (PF) 0.5 % IJ SOLN
INTRAMUSCULAR | Status: AC
  Filled 2021-01-06: qty 30

## 2021-01-06 MED ORDER — DEXAMETHASONE SODIUM PHOSPHATE 10 MG/ML IJ SOLN
INTRAMUSCULAR | Status: DC | PRN
  Administered 2021-01-06: 10 mg via INTRAVENOUS

## 2021-01-06 MED ORDER — HYDROCODONE-ACETAMINOPHEN 5-325 MG PO TABS: 1.0000 | ORAL_TABLET | ORAL | 0 refills | Status: DC | PRN

## 2021-01-06 SURGICAL SUPPLY — 30 items
ADH SKN CLS APL DERMABOND .7 (GAUZE/BANDAGES/DRESSINGS) ×1
APL PRP STRL LF ISPRP CHG 10.5 (MISCELLANEOUS) ×1
APPLICATOR CHLORAPREP 10.5 ORG (MISCELLANEOUS) ×2 IMPLANT
BLADE SURG SZ11 CARB STEEL (BLADE) IMPLANT
CLOTH BEACON ORANGE TIMEOUT ST (SAFETY) ×2 IMPLANT
COVER LIGHT HANDLE STERIS (MISCELLANEOUS) ×4 IMPLANT
COVER WAND RF STERILE (DRAPES) ×2 IMPLANT
DECANTER SPIKE VIAL GLASS SM (MISCELLANEOUS) ×2 IMPLANT
DERMABOND ADVANCED (GAUZE/BANDAGES/DRESSINGS) ×1
DERMABOND ADVANCED .7 DNX12 (GAUZE/BANDAGES/DRESSINGS) IMPLANT
ELECT REM PT RETURN 9FT ADLT (ELECTROSURGICAL) ×2
ELECTRODE REM PT RTRN 9FT ADLT (ELECTROSURGICAL) ×1 IMPLANT
GAUZE SPONGE 4X4 12PLY STRL (GAUZE/BANDAGES/DRESSINGS) IMPLANT
GLOVE SURG SS PI 7.5 STRL IVOR (GLOVE) ×2 IMPLANT
GLOVE SURG UNDER POLY LF SZ7 (GLOVE) ×4 IMPLANT
GOWN STRL REUS W/TWL LRG LVL3 (GOWN DISPOSABLE) ×4 IMPLANT
KIT TURNOVER KIT A (KITS) ×2 IMPLANT
MANIFOLD NEPTUNE II (INSTRUMENTS) ×2 IMPLANT
NDL HYPO 25X1 1.5 SAFETY (NEEDLE) ×1 IMPLANT
NEEDLE HYPO 25X1 1.5 SAFETY (NEEDLE) ×2 IMPLANT
NS IRRIG 1000ML POUR BTL (IV SOLUTION) ×2 IMPLANT
PACK MINOR (CUSTOM PROCEDURE TRAY) ×2 IMPLANT
PAD ARMBOARD 7.5X6 YLW CONV (MISCELLANEOUS) ×2 IMPLANT
SET BASIN LINEN APH (SET/KITS/TRAYS/PACK) ×2 IMPLANT
SUT MNCRL AB 4-0 PS2 18 (SUTURE) ×1 IMPLANT
SUT PROLENE 3 0 PS 1 (SUTURE) ×1 IMPLANT
SUT PROLENE 4 0 PS 2 18 (SUTURE) IMPLANT
SUT VIC AB 3-0 SH 27 (SUTURE)
SUT VIC AB 3-0 SH 27X BRD (SUTURE) IMPLANT
SYR CONTROL 10ML LL (SYRINGE) ×2 IMPLANT

## 2021-01-06 NOTE — Discharge Instructions (Signed)
Excision of Skin Lesions, Care After This sheet gives you information about how to care for yourself after your procedure. Your health care provider may also give you more specific instructions. If you have problems or questions, contact your health care provider. What can I expect after the procedure? After your procedure, it is common to have pain or discomfort at the excision site. Follow these instructions at home: Excision care  Follow instructions from your health care provider about how to take care of your excision site. Make sure you: ? Wash your hands with soap and water before and after you change your bandage (dressing). If soap and water are not available, use hand sanitizer. ? Change your dressing as told by your health care provider. ? Leave stitches (sutures), skin glue, or adhesive strips in place. These skin closures may need to stay in place for 2 weeks or longer. If adhesive strip edges start to loosen and curl up, you may trim the loose edges. Do not remove adhesive strips completely unless your health care provider tells you to do that.  Check the excision area every day for signs of infection. Watch for: ? Redness, swelling, or pain. ? Fluid or blood. ? Warmth. ? Pus or a bad smell.  Keep the site clean, dry, and protected for at least 48 hours.  For bleeding, apply gentle but firm pressure to the area using a folded towel for 20 minutes.  Avoid high-impact exercise and activities until the sutures are removed or the area heals.   General instructions  Take over-the-counter and prescription medicines only as told by your health care provider.  Follow instructions from your health care provider about how to minimize scarring. Scarring should lessen over time.  Avoid sun exposure until the area has healed. Use sunscreen to protect the area from the sun after it has healed.  Keep all follow-up visits as told by your health care provider. This is important. Contact  a health care provider if:  You have redness, swelling, or pain around your excision site.  You have fluid or blood coming from your excision site.  Your excision site feels warm to the touch.  You have pus or a bad smell coming from your excision site.  You have a fever.  You have pain that does not improve in 2-3 days after your procedure.  You notice skin irregularities or changes in how you feel (sensation). Summary  This sheet of instructions provides you with information about caring for yourself after your procedure. Contact your health care provider if you have any problems or questions.  Take over-the-counter and prescription medicines only as told by your health care provider.  Change your dressing as told by your health care provider.  Contact a health care provider if you have redness, swelling, pain, or other signs of infection around your excision site.  Keep all follow-up visits as told by your health care provider. This is important. This information is not intended to replace advice given to you by your health care provider. Make sure you discuss any questions you have with your health care provider. Document Revised: 03/05/2018 Document Reviewed: 03/05/2018 Elsevier Patient Education  2021 [place]. Thyroglossal Cyst Removal, Care After This sheet gives you information about how to care for yourself after your procedure. Your health care provider may also give you more specific instructions. If you have problems or questions, contact your health care provider. What can I expect after the procedure? After the procedure, it  is common to have:  Pain, swelling, and soreness in your throat.  Difficulty swallowing.  A hoarse voice.  A small amount of blood in your saliva for a few days. Follow these instructions at home: Medicines  Take over-the-counter and prescription medicines only as told by your health care provider.  If you were prescribed an  antibiotic medicine, take it as told by your health care provider. Do not stop taking the antibiotic even if you start to feel better.  Ask your health care provider if the medicine prescribed to you: ? Requires you to avoid driving or using heavy machinery. ? Can cause constipation. You may need to take actions to prevent or treat constipation, such as:  Drink enough fluid to keep your urine pale yellow.  Take over-the-counter or prescription medicines.  Eat foods that are high in fiber, such as fresh fruits and vegetables, whole grains, and beans.  Limit foods that are high in fat and processed sugars, such as fried and sweet foods. Incision care  Follow instructions from your health care provider about how to take care of your incision. Make sure you: ? Wash your hands with soap and water before and after you change your bandage (dressing). If soap and water are not available, use hand sanitizer. ? Change your dressing as told by your health care provider. ? Leave stitches (sutures), skin glue, or adhesive strips in place. These skin closures may need to stay in place for 2 weeks or longer. If adhesive strip edges start to loosen and curl up, you may trim the loose edges. Do not remove adhesive strips completely unless your health care provider tells you to do that.  Check your incision area every day for signs of infection. Check for: ? More redness, swelling, or pain. ? Fluid or blood leaking from the incision. ? Warmth. ? Pus or a bad smell.  If you have a drain in your incision area, follow instructions from your health care provider about how to care for the drain.   General instructions  Do not use any products that contain nicotine or tobacco, such as cigarettes, e-cigarettes, and chewing tobacco. If you need help quitting, ask your health care provider.  Do not drive for 24 hours if you were given a sedative during your procedure.  Follow instructions from your health care  provider about eating or drinking restrictions. ? You may be told to have only liquids for the first day after surgery. ? If swallowing is painful, try eating soft foods until you feel better.  Do not take baths, swim, or use a hot tub until your health care provider approves.  Do not lift anything that is heavier than 10 lb (4.5 kg), or the limit that you are told, until your health care provider says that it is safe.  Return to your normal activities as told by your health care provider. Ask your health care provider what activities are safe for you.  Keep all follow-up visits as told by your health care provider. This is important. Contact a health care provider if:  You have pain that gets worse or does not get better with medicine.  You have more redness, swelling, or pain around your incision.  You have fluid or blood coming from your incision.  Your incision feels warm to the touch.  You have pus or a bad smell coming from your incision.  You have hoarseness that does not get better in 7-10 days.  You have difficulty  swallowing, and this does not go away after 1 week.  Your cyst grows back.  You vomit or feel nauseous. Get help right away if:  You have difficulty breathing.  You cannot swallow.  You have severe pain. Summary  After the procedure, it is common to have pain, swelling, and soreness in your throat.  Follow instructions from your health care provider about how to take care of your incision. Check your incision area every day for signs of infection.  Follow instructions from your health care provider about eating or drinking restrictions. You may be told to have only liquids for the first day after surgery.  Return to your normal activities as told by your health care provider. This information is not intended to replace advice given to you by your health care provider. Make sure you discuss any questions you have with your health care  provider. Document Revised: 07/03/2018 Document Reviewed: 07/03/2018 Elsevier Patient Education  2021 [employer].

## 2021-01-06 NOTE — Anesthesia Procedure Notes (Signed)
Procedure Name: LMA Insertion Date/Time: 01/06/2021 7:36 AM Performed by: Jonna Munro, CRNA Pre-anesthesia Checklist: Patient identified, Emergency Drugs available, Suction available, Patient being monitored and Timeout performed Patient Re-evaluated:Patient Re-evaluated prior to induction Oxygen Delivery Method: Circle system utilized Preoxygenation: Pre-oxygenation with 100% oxygen Induction Type: IV induction LMA: LMA inserted LMA Size: 5.0 Number of attempts: 1 Placement Confirmation: positive ETCO2 and breath sounds checked- equal and bilateral Tube secured with: Tape Dental Injury: Teeth and Oropharynx as per pre-operative assessment

## 2021-01-06 NOTE — Anesthesia Preprocedure Evaluation (Signed)
Anesthesia Evaluation  Patient identified by MRN, date of birth, ID band Patient awake    Reviewed: Allergy & Precautions, NPO status , Patient's Chart, lab work & pertinent test results  History of Anesthesia Complications (+) history of anesthetic complications (combative waking up after back surgery)  Airway Mallampati: II  TM Distance: >3 FB Neck ROM: Full    Dental  (+) Dental Advisory Given, Teeth Intact   Pulmonary neg pulmonary ROS,    Pulmonary exam normal breath sounds clear to auscultation       Cardiovascular Exercise Tolerance: Good hypertension, Pt. on medications Normal cardiovascular exam Rhythm:Regular Rate:Normal     Neuro/Psych PSYCHIATRIC DISORDERS Anxiety Depression negative neurological ROS     GI/Hepatic negative GI ROS, Neg liver ROS,   Endo/Other  Hypothyroidism   Renal/GU negative Renal ROS     Musculoskeletal  (+) Arthritis ,   Abdominal   Peds  Hematology negative hematology ROS (+)   Anesthesia Other Findings   Reproductive/Obstetrics negative OB ROS                             Anesthesia Physical Anesthesia Plan  ASA: II  Anesthesia Plan: General   Post-op Pain Management:    Induction: Intravenous  PONV Risk Score and Plan: Ondansetron, Dexamethasone and Midazolam  Airway Management Planned: LMA  Additional Equipment:   Intra-op Plan:   Post-operative Plan:   Informed Consent: I have reviewed the patients History and Physical, chart, labs and discussed the procedure including the risks, benefits and alternatives for the proposed anesthesia with the patient or authorized representative who has indicated his/her understanding and acceptance.     Dental advisory given  Plan Discussed with: CRNA and Surgeon  Anesthesia Plan Comments:         Anesthesia Quick Evaluation

## 2021-01-06 NOTE — Transfer of Care (Signed)
Immediate Anesthesia Transfer of Care Note  Patient: Kenneth [name]  Procedure(s) Performed: EXCISION, CYST, BUTTOCK (Left Buttocks)  Patient Location: PACU  Anesthesia Type:General  Level of Consciousness: awake, alert , oriented and patient cooperative  Airway & Oxygen Therapy: Patient Spontanous Breathing and Patient connected to nasal cannula oxygen  Post-op Assessment: Report given to RN, Post -op Vital signs reviewed and stable and Patient moving all extremities X 4  Post vital signs: Reviewed and stable  Last Vitals:  Vitals Value Taken Time  BP    Temp    Pulse    Resp    SpO2      Last Pain:  Vitals:   01/06/21 0646  TempSrc: Oral  PainSc: 0-No pain      Patients Stated Pain Goal: 5 (63/84/66 5993)  Complications: No complications documented.

## 2021-01-06 NOTE — Interval H&P Note (Signed)
History and Physical Interval Note:  01/06/2021 7:10 AM  Kenneth [name]  has presented today for surgery, with the diagnosis of Sebaceous cyst, left, buttock.  The various methods of treatment have been discussed with the patient and family. After consideration of risks, benefits and other options for treatment, the patient has consented to  Procedure(s): EXCISION, CYST, BUTTOCK (Left) as a surgical intervention.  The patient's history has been reviewed, patient examined, no change in status, stable for surgery.  I have reviewed the patient's chart and labs.  Questions were answered to the patient's satisfaction.     Aviva Signs

## 2021-01-06 NOTE — Op Note (Signed)
Patient:  Kenneth [name]  DOB:  11/27/1965  MRN:  [id number]   Preop Diagnosis: Epidermoid cyst, left buttock  Postop Diagnosis: Same  Procedure: Excision of cyst, left buttock  Surgeon: Aviva Signs, MD  Anes: General   Indications: Patient is a 55 year old white male with a persistent epidermoid cyst along the inferior aspect of the left buttock.  It does cause pain when pressure is applied while sitting.  The risks and benefits of the procedure including bleeding, infection, and recurrence of the cyst were fully explained to the patient, who gave informed consent.  Procedure note: The patient was placed in the right lateral decubitus position after general anesthesia was administered.  The left buttock was prepped and draped using the usual sterile technique with ChloraPrep.  Surgical site confirmation was performed.  Elliptical incision was made over the previously marked area that was along the inferior and medial aspect of the left buttock.  The incision was approximately 1.5 cm in its greatest diameter.  A full-thickness excision was performed to include subcutaneous tissue.  The specimen was removed and was not sent to pathology due to its destruction during the procedure.  Appear to be granulomatous in nature.  A bleeding was controlled using Bovie electrocautery.  The wound was inspected and no other areas of concern were noted.  0.5% Marcaine was instilled into the surrounding wound.  The subcutaneous layer was reapproximated using a 3-0 Vicryl interrupted suture.  The skin was closed using a 4-0 Monocryl subcuticular suture.  Dermabond was applied.  All tape and needle counts were correct at the end of the procedure.  The patient was awakened and transferred to PACU in stable condition.  Complications: None  EBL: Minimal  Specimen: None

## 2021-01-06 NOTE — Anesthesia Postprocedure Evaluation (Signed)
Anesthesia Post Note  Patient: MALEEK CRAVER  Procedure(s) Performed: EXCISION, CYST, BUTTOCK (Left Buttocks)  Patient location during evaluation: PACU Anesthesia Type: General Level of consciousness: awake and alert and oriented Pain management: pain level controlled Vital Signs Assessment: post-procedure vital signs reviewed and stable Respiratory status: spontaneous breathing and respiratory function stable Cardiovascular status: blood pressure returned to baseline and stable Postop Assessment: no apparent nausea or vomiting Anesthetic complications: no   No complications documented.   Last Vitals:  Vitals:   01/06/21 0840 01/06/21 0845  BP:  113/81  Pulse:  89  Resp:  18  Temp:  36.7 C  SpO2: 94% 97%    Last Pain:  Vitals:   01/06/21 0845  TempSrc: Oral  PainSc: 0-No pain                 Gurneet Matarese C Emit Kuenzel

## 2021-01-09 ENCOUNTER — Encounter (HOSPITAL_COMMUNITY): Payer: Self-pay | Admitting: General Surgery

## 2021-01-19 ENCOUNTER — Encounter: Payer: Self-pay | Admitting: Nurse Practitioner

## 2021-01-19 ENCOUNTER — Other Ambulatory Visit: Payer: Self-pay

## 2021-01-19 ENCOUNTER — Ambulatory Visit (INDEPENDENT_AMBULATORY_CARE_PROVIDER_SITE_OTHER): Payer: 59 | Admitting: Nurse Practitioner

## 2021-01-19 VITALS — BP 134/88 | HR 82 | Temp 97.6°F | Resp 20 | Ht 71.0 in | Wt 195.0 lb

## 2021-01-19 DIAGNOSIS — M546 Pain in thoracic spine: Secondary | ICD-10-CM

## 2021-01-19 MED ORDER — METHYLPREDNISOLONE ACETATE 40 MG/ML IJ SUSP
40.0000 mg | Freq: Once | INTRAMUSCULAR | Status: AC
  Administered 2021-01-19: 40 mg via INTRAMUSCULAR

## 2021-01-19 MED ORDER — PREDNISONE 20 MG PO TABS: 40.0000 mg | ORAL_TABLET | Freq: Every day | ORAL | 0 refills | Status: AC

## 2021-01-19 NOTE — Patient Instructions (Signed)
Radicular Pain Radicular pain is a type of pain that spreads from your back or neck along a spinal nerve. Spinal nerves are nerves that leave the spinal cord and go to the muscles. Radicular pain is sometimes called radiculopathy, radiculitis, or a pinched nerve. When you have this type of pain, you may also have weakness, numbness, or tingling in the area of your body that is supplied by the nerve. The pain may feel sharp and burning. Depending on which spinal nerve is affected, the pain may occur in the:  Neck area (cervical radicular pain). You may also feel pain, numbness, weakness, or tingling in the arms.  Mid-spine area (thoracic radicular pain). You would feel this pain in the back and chest. This type is rare.  Lower back area (lumbar radicular pain). You would feel this pain as low back pain. You may feel pain, numbness, weakness, or tingling in the buttocks or legs. Sciatica is a type of lumbar radicular pain that shoots down the back of the leg. Radicular pain occurs when one of the spinal nerves becomes irritated or squeezed (compressed). It is often caused by something pushing on a spinal nerve, such as one of the bones of the spine (vertebrae) or one of the round cushions between vertebrae (intervertebral disks). This can result from:  An injury.  Wear and tear or aging of a disk.  The growth of a bone spur that pushes on the nerve. Radicular pain often goes away when you follow instructions from your health care provider for relieving pain at home. Follow these instructions at home: Managing pain  If directed, put ice on the affected area: ? Put ice in a plastic bag. ? Place a towel between your skin and the bag. ? Leave the ice on for 20 minutes, 2-3 times a day.  If directed, apply heat to the affected area as often as told by your health care provider. Use the heat source that your health care provider recommends, such as a moist heat pack or a heating pad. ? Place a towel  between your skin and the heat source. ? Leave the heat on for 20-30 minutes. ? Remove the heat if your skin turns bright red. This is especially important if you are unable to feel pain, heat, or cold. You may have a greater risk of getting burned.      Activity  Do not sit or rest in bed for long periods of time.  Try to stay as active as possible. Ask your health care provider what type of exercise or activity is best for you.  Avoid activities that make your pain worse, such as bending and lifting.  Do not lift anything that is heavier than 10 lb (4.5 kg), or the limit that you are told, until your health care provider says that it is safe.  Practice using proper technique when lifting items. Proper lifting technique involves bending your knees and rising up.  Do strength and range-of-motion exercises only as told by your health care provider or physical therapist.   General instructions  Take over-the-counter and prescription medicines only as told by your health care provider.  Pay attention to any changes in your symptoms.  Keep all follow-up visits as told by your health care provider. This is important. ? Your health care provider may send you to a physical therapist to help with this pain. Contact a health care provider if:  Your pain and other symptoms get worse.  Your pain medicine   is not helping.  Your pain has not improved after a few weeks of home care.  You have a fever. Get help right away if:  You have severe pain, weakness, or numbness.  You have difficulty with bladder or bowel control. Summary  Radicular pain is a type of pain that spreads from your back or neck along a spinal nerve.  When you have radicular pain, you may also have weakness, numbness, or tingling in the area of your body that is supplied by the nerve.  The pain may feel sharp or burning.  Radicular pain may be treated with ice, heat, medicines, or physical therapy. This  information is not intended to replace advice given to you by your health care provider. Make sure you discuss any questions you have with your health care provider. Document Revised: 03/11/2018 Document Reviewed: 03/11/2018 Elsevier Patient Education  2021 [employer] Inc.  

## 2021-01-19 NOTE — Progress Notes (Signed)
   Subjective:    Patient ID: Kenneth [name], male    DOB: April 02, 1966, 55 y.o.   MRN: [id number]   Chief Complaint: Back Pain   HPI Patient comes in today c/o back pain. He has a long history of back pain. He says it is in between his shoulder blades and radiates to left arm and down. Rates pain 7-8/10. Nothing helps. Has been taking motrin and tylenol. Laying down increases pain.   Review of Systems  Constitutional: Negative for diaphoresis.  Eyes: Negative for pain.  Respiratory: Negative for shortness of breath.   Cardiovascular: Negative for chest pain, palpitations and leg swelling.  Gastrointestinal: Negative for abdominal pain.  Endocrine: Negative for polydipsia.  Musculoskeletal: Positive for back pain.  Skin: Negative for rash.  Neurological: Negative for dizziness, weakness and headaches.  Hematological: Does not bruise/bleed easily.  All other systems reviewed and are negative.      Objective:   Physical Exam Vitals and nursing note reviewed.  Constitutional:      Appearance: Normal appearance.  Cardiovascular:     Rate and Rhythm: Normal rate and regular rhythm.     Heart sounds: Normal heart sounds.  Pulmonary:     Effort: Pulmonary effort is normal.     Breath sounds: Normal breath sounds.  Musculoskeletal:     Comments: FROM of neck with pinched tinging sensation down left arm with extension of neck. DTR + bil Motor strength and sensation distally intact.  Skin:    General: Skin is warm.  Neurological:     General: No focal deficit present.     Mental Status: He is alert and oriented to person, place, and time.  Psychiatric:        Mood and Affect: Mood normal.        Behavior: Behavior normal.    BP 134/88   Pulse 82   Temp 97.6 F (36.4 C) (Temporal)   Resp 20   Ht 5\' 11"  (1.803 m)   Wt 195 lb (88.5 kg)   SpO2 96%   BMI 27.20 kg/m         Assessment & Plan:  Glade Stanford in today with chief complaint of Back Pain   1. Acute  left-sided thoracic back pain Moist heat Rest Ice if helps Follow up with PCP prn  Meds ordered this encounter  Medications  . methylPREDNISolone acetate (DEPO-MEDROL) injection 40 mg  . predniSONE (DELTASONE) 20 MG tablet    Sig: Take 2 tablets (40 mg total) by mouth daily with breakfast for 5 days. 2 po daily for 5 days    Dispense:  10 tablet    Refill:  0    Order Specific Question:   Supervising Provider    Answer:   Caryl Pina A [[id number]]        The above assessment and management plan was discussed with the patient. The patient verbalized understanding of and has agreed to the management plan. Patient is aware to call the clinic if symptoms persist or worsen. Patient is aware when to return to the clinic for a follow-up visit. Patient educated on when it is appropriate to go to the emergency department.   Mary-Margaret Hassell Done, FNP

## 2021-02-19 IMAGING — US US CAROTID DUPLEX BILAT
1 series · 13 of 24 positions shown · non-contrast
Comparison: None.

CLINICAL DATA: 54-year-old male with a history of positive
screening study and family history of atherosclerosis

EXAM:
BILATERAL CAROTID DUPLEX ULTRASOUND
TECHNIQUE: Gray scale imaging, color Doppler and duplex ultrasound were
performed of bilateral carotid and vertebral arteries in the neck.

[Series 1: us carotid duplex bilat · 0.06mm/px · 13 of 96 slices shown]
[im 1/96]
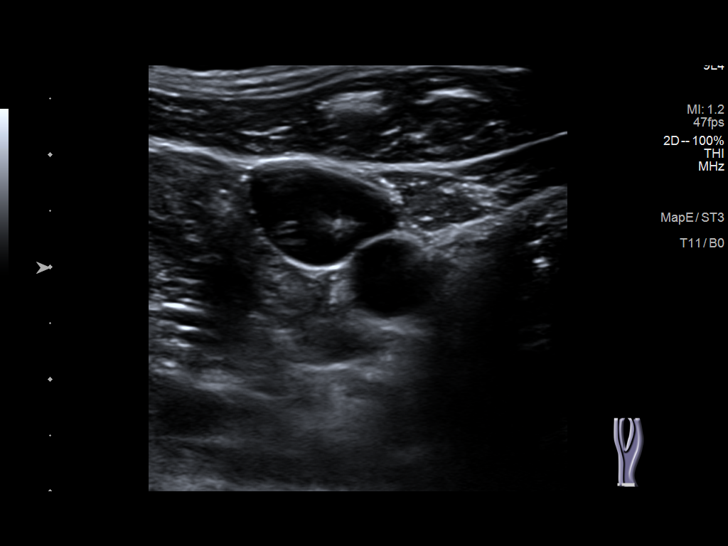
[im 9/96]
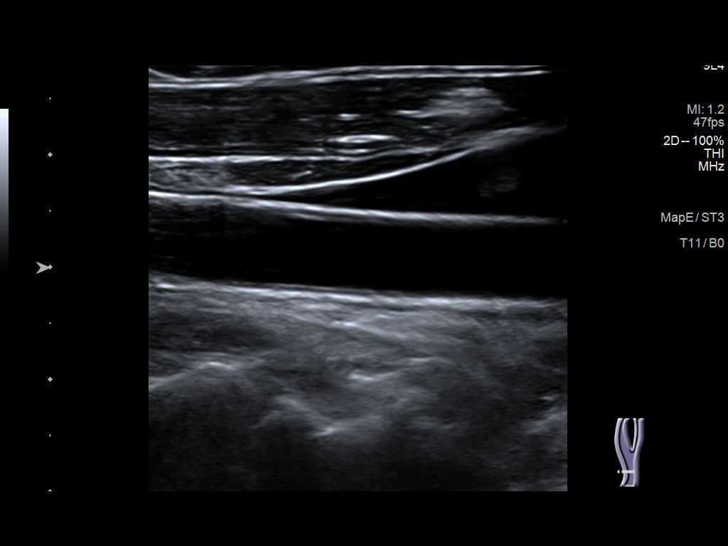
[im 17/96]
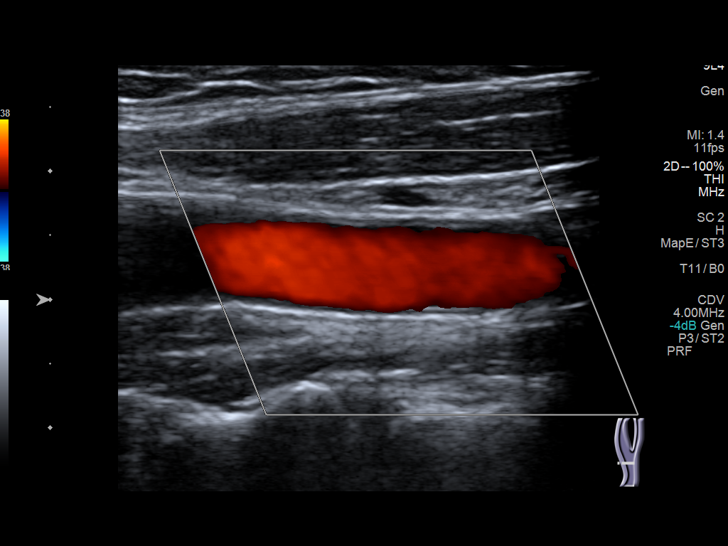
[im 25/96]
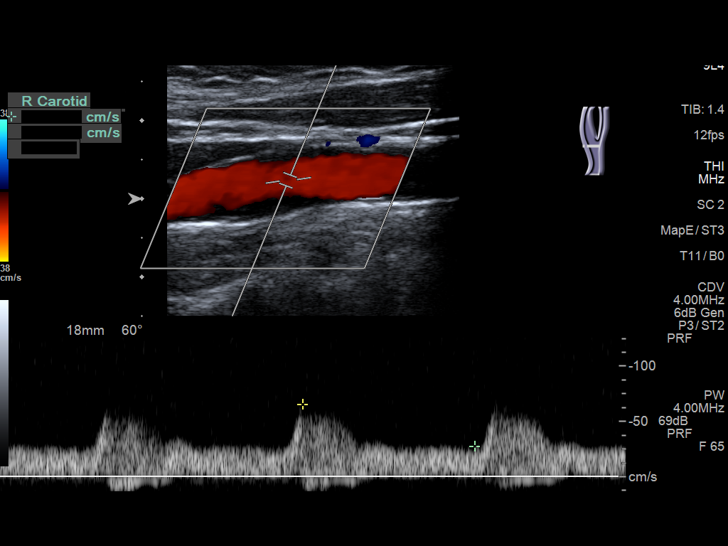
[im 34/96]
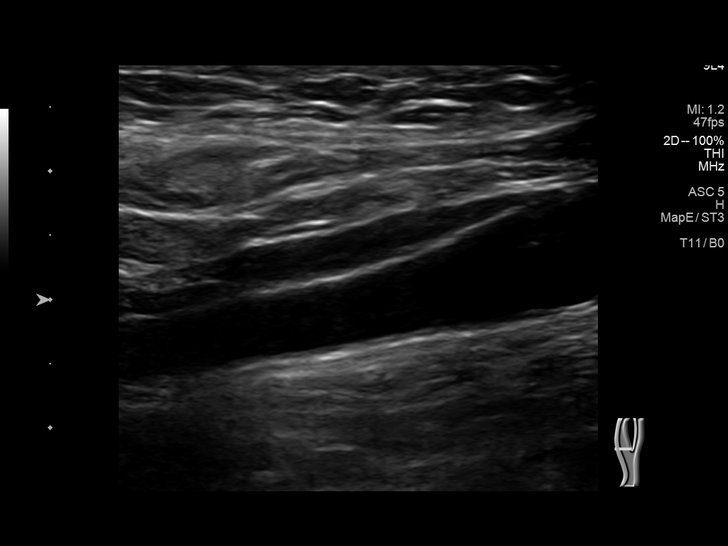
[im 42/96]
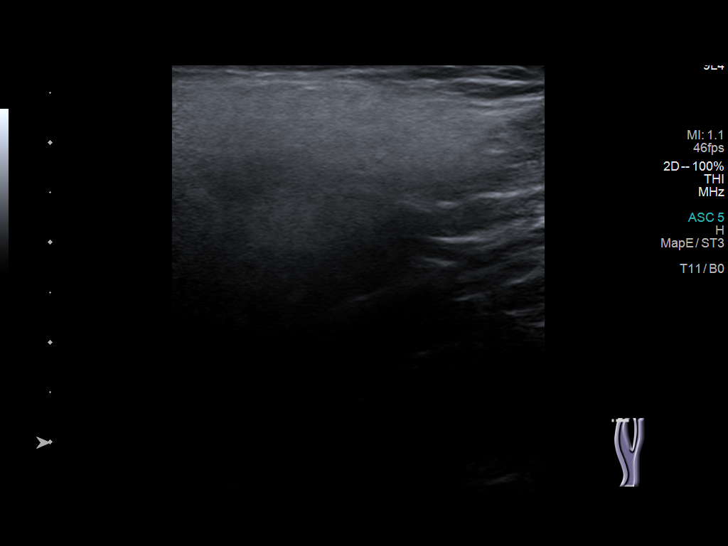
[im 50/96]
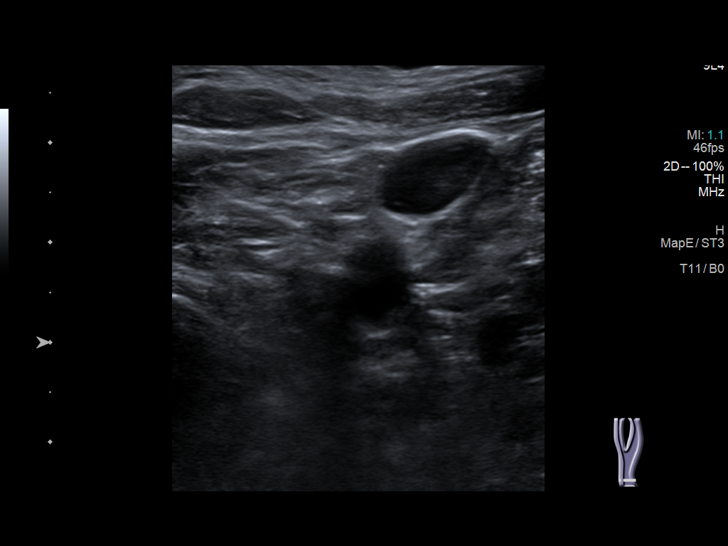
[im 54/96]
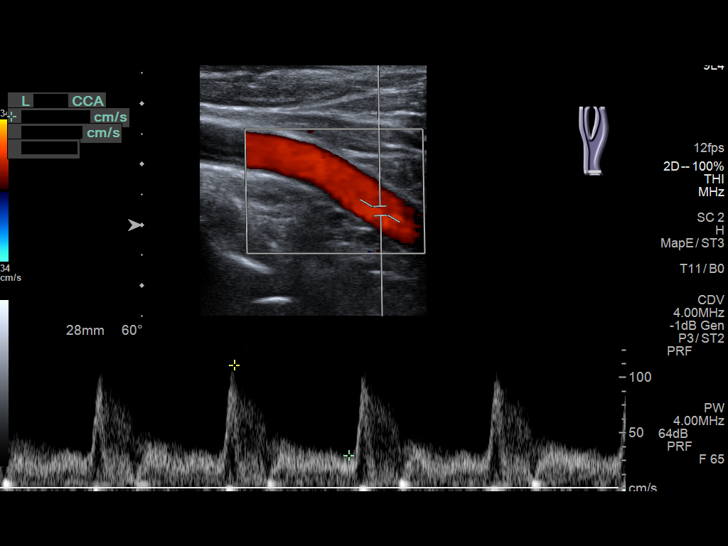
[im 62/96]
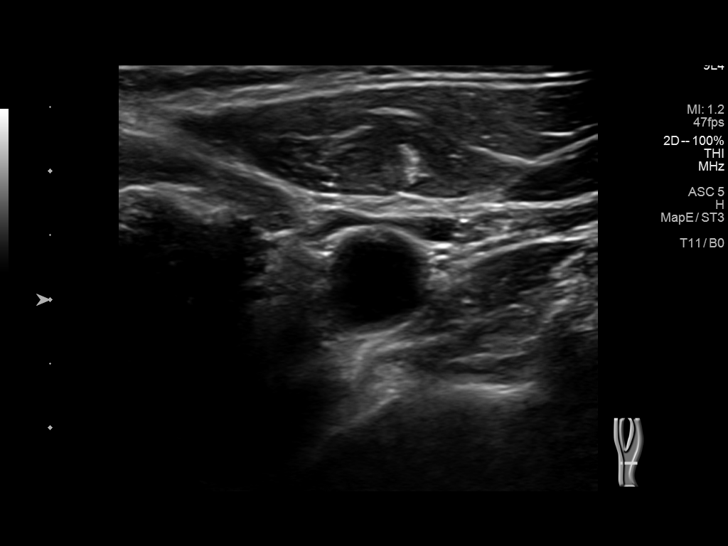
[im 71/96]
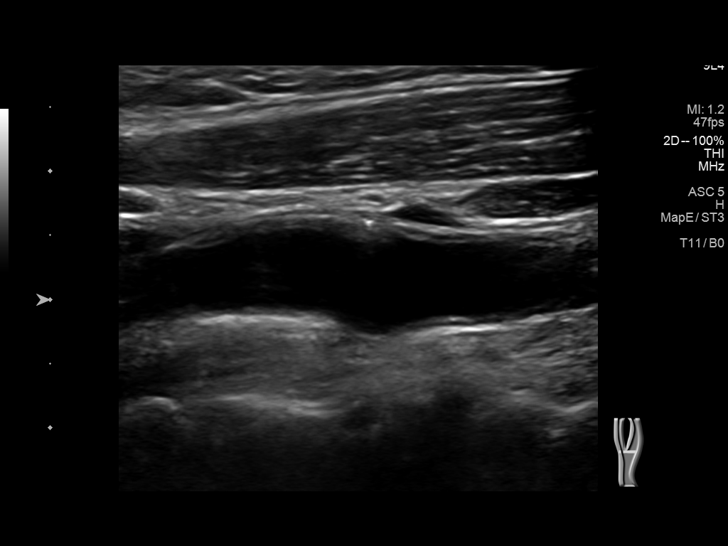
[im 79/96]
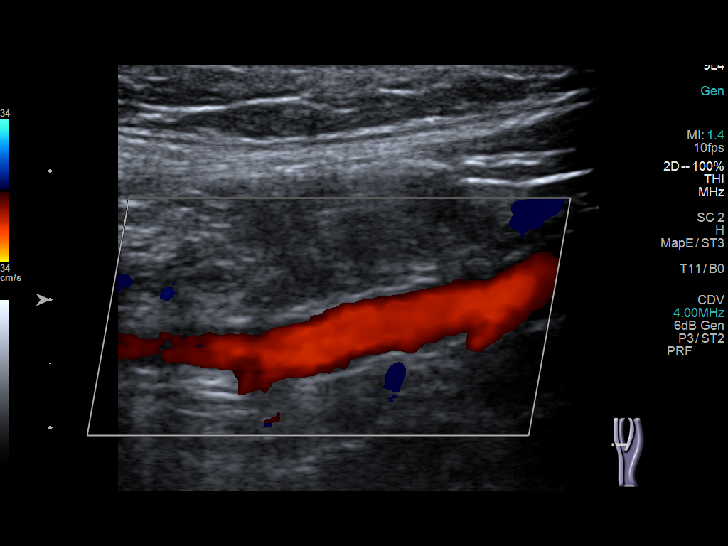
[im 87/96]
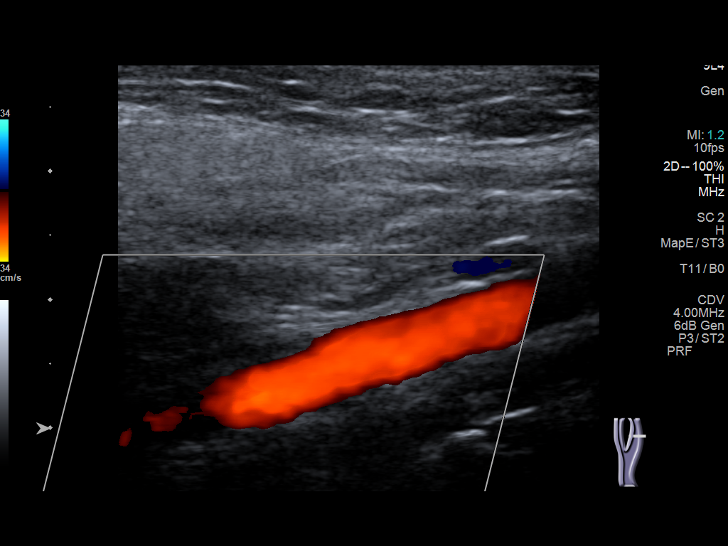
[im 96/96]
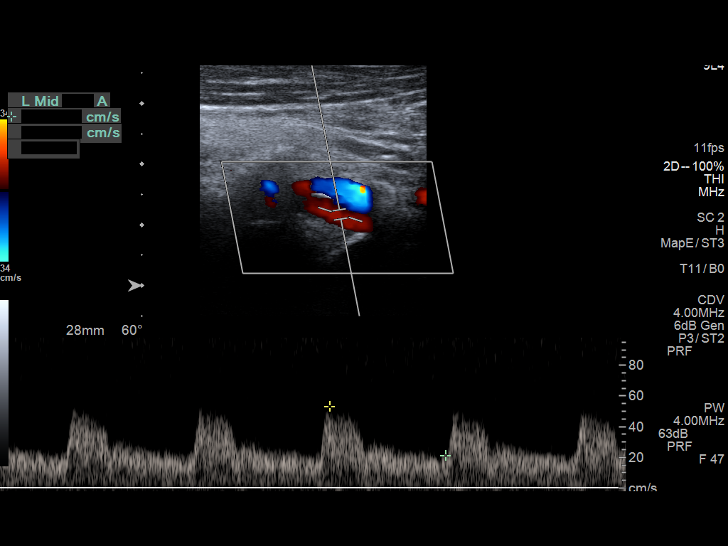

[13 of 24 positions shown; findings below may reference images not displayed]

FINDINGS: Criteria: Quantification of carotid stenosis is based on velocity
parameters that correlate the residual internal carotid diameter
with NASCET-based stenosis levels, using the diameter of the distal
internal carotid lumen as the denominator for stenosis measurement.

The following velocity measurements were obtained:

RIGHT

ICA:  Systolic 91 cm/sec, Diastolic 38 cm/sec

CCA:  84 cm/sec

SYSTOLIC ICA/CCA RATIO:

ECA:  116 cm/sec

LEFT

ICA:  Systolic 97 cm/sec, Diastolic 30 cm/sec

CCA:  82 cm/sec

SYSTOLIC ICA/CCA RATIO:

ECA:  93 cm/sec

Right Brachial SBP: Not acquired

Left Brachial SBP: Not acquired

RIGHT CAROTID ARTERY: No significant calcified disease of the right
common carotid artery. Intermediate waveform maintained. Homogeneous
plaque without significant calcifications at the right carotid
bifurcation. Low resistance waveform of the right ICA. No
significant tortuosity.

RIGHT VERTEBRAL ARTERY: Antegrade flow with low resistance waveform.

LEFT CAROTID ARTERY: No significant calcified disease of the left
common carotid artery. Intermediate waveform maintained. Homogeneous
plaque at the left carotid bifurcation without significant
calcifications. Low resistance waveform of the left ICA.

LEFT VERTEBRAL ARTERY:  Antegrade flow with low resistance waveform.
IMPRESSION: Color duplex indicates minimal homogeneous plaque, with no
hemodynamically significant stenosis by duplex criteria in the
extracranial cerebrovascular circulation.

## 2021-03-07 ENCOUNTER — Ambulatory Visit (INDEPENDENT_AMBULATORY_CARE_PROVIDER_SITE_OTHER): Payer: 59 | Admitting: Family Medicine

## 2021-03-07 ENCOUNTER — Other Ambulatory Visit: Payer: Self-pay

## 2021-03-07 ENCOUNTER — Encounter: Payer: Self-pay | Admitting: Family Medicine

## 2021-03-07 VITALS — BP 122/90 | HR 73 | Temp 98.4°F | Ht 71.0 in | Wt 197.6 lb

## 2021-03-07 DIAGNOSIS — E782 Mixed hyperlipidemia: Secondary | ICD-10-CM

## 2021-03-07 DIAGNOSIS — F339 Major depressive disorder, recurrent, unspecified: Secondary | ICD-10-CM | POA: Diagnosis not present

## 2021-03-07 DIAGNOSIS — E890 Postprocedural hypothyroidism: Secondary | ICD-10-CM | POA: Diagnosis not present

## 2021-03-07 DIAGNOSIS — F411 Generalized anxiety disorder: Secondary | ICD-10-CM | POA: Diagnosis not present

## 2021-03-07 MED ORDER — FLUOXETINE HCL 20 MG PO CAPS: 20.0000 mg | ORAL_CAPSULE | Freq: Every day | ORAL | 0 refills | Status: DC

## 2021-03-07 NOTE — Patient Instructions (Signed)
Plan to start Prozac first then add Wellbutrin next visit if still needed.  The prozac should help BOTH the anxiety and depression.  Taking the medicine as directed and not missing any doses is one of the best things you can do to treat your anxiety/depression.  Here are some things to keep in mind:  Side effects (stomach upset, some increased anxiety) may happen before you notice a benefit.  These side effects typically go away over time. Changes to your dose of medicine or a change in medication all together is sometimes necessary Most people need to be on medication at least 12 months Many people will notice an improvement within two weeks but the full effect of the medication can take up to 4-6 weeks Stopping the medication when you start feeling better often results in a return of symptoms Never discontinue your medication without contacting a health care professional first.  Some medications require gradual discontinuation/ taper and can make you sick if you stop them abruptly.  If your symptoms worsen or you have thoughts of suicide/homicide, PLEASE SEEK IMMEDIATE MEDICAL ATTENTION.  You may always call:  National Suicide Hotline: 575-769-2886 [place]: 906-366-6124 Crisis Recovery in [place]: [phone]) 330-9750   These are available 24 hours a day, 7 days a week.

## 2021-03-07 NOTE — Progress Notes (Signed)
Subjective: CC: Anxiety depression, lipid PCP: Janora Norlander, DO Kenneth [name] is a 56 y.o. male presenting to clinic today for:  1.  Anxiety and depression Patient reports that he continues to have quite a bit of anxiety and worries that he has been having increased need both frequency and dose for alprazolam.  He does not want to become dependent on this and continues to only use it very sparingly.  He was previously treated with Lexapro unfortunately had intolerance to this medicine.  His son is treated with Prozac and Wellbutrin and he wonders if these would be a good fit for him.  He would certainly like to try them.  He notes that he continues to butt heads with his 66 year old son, who is now working with him along with his other son.  He lives having his family close but unfortunately feels that he has been having increased anxiety around him as well.  Sleep is fair and he continues to only use Ambien up to 2 nights per week.  Does not report any excessive daytime sedation, falls, respiratory depression or sleepwalking.  2.  Hypothyroid Patient is compliant with Synthroid 150 mcg daily.  He takes this roughly 15 to 20 minutes before he takes his other medications.  Denies any change in voice, difficulty swallowing, heart palpitations, tremor or change in bowel habits  3.  Hyperlipidemia Patient is compliant with both Crestor and fenofibrate.  Reports of chest pain, shortness of breath or abdominal pain.  He is fasting today and would like to get these levels repeated as he is been very compliant with his medications described.  Is worried that he is on too many cholesterol medications.  Would like to reduce if able   ROS: Per HPI  No Known Allergies Past Medical History:  Diagnosis Date   Complication of anesthesia    combative waking up after back surgery   DDD (degenerative disc disease), lumbar    Hyperlipidemia    Hypertension    Hypothyroidism     Current  Outpatient Medications:    ALPRAZolam (XANAX) 1 MG tablet, Take 0.5-1 tablets (0.5-1 mg total) by mouth 2 (two) times daily as needed for anxiety., Disp: 60 tablet, Rfl: 0   fenofibrate 160 MG tablet, TAKE 1 TABLET DAILY (Patient taking differently: Take 160 mg by mouth daily.), Disp: 30 tablet, Rfl: 3   hydrocortisone (ANUSOL-HC) 2.5 % rectal cream, Place 1 application rectally 2 (two) times daily. (Patient taking differently: Place 1 application rectally 2 (two) times daily as needed for hemorrhoids.), Disp: 30 g, Rfl: 0   ibuprofen (ADVIL) 200 MG tablet, Take 400 mg by mouth every 8 (eight) hours as needed for moderate pain., Disp: , Rfl:    levothyroxine (SYNTHROID) 150 MCG tablet, Take 1 tablet (150 mcg total) by mouth daily. (Patient taking differently: Take 150 mcg by mouth daily before breakfast.), Disp: 90 tablet, Rfl: 1   losartan-hydrochlorothiazide (HYZAAR) 50-12.5 MG tablet, Take 1 tablet by mouth daily., Disp: 90 tablet, Rfl: 3   rosuvastatin (CRESTOR) 40 MG tablet, Take 1 tablet (40 mg total) by mouth daily., Disp: 90 tablet, Rfl: 3   zolpidem (AMBIEN) 10 MG tablet, Take 0.5-1 tablets (5-10 mg total) by mouth at bedtime as needed for sleep., Disp: 30 tablet, Rfl: 3 Social History   Socioeconomic History   Marital status: Married    Spouse name: Not on file   Number of children: Not on file   Years of education: Not on  file   Highest education level: Not on file  Occupational History   Not on file  Tobacco Use   Smoking status: Never   Smokeless tobacco: Never  Vaping Use   Vaping Use: Never used  Substance and Sexual Activity   Alcohol use: No   Drug use: No   Sexual activity: Not on file  Other Topics Concern   Not on file  Social History Narrative   Not on file   Social Determinants of Health   Financial Resource Strain: Not on file  Food Insecurity: Not on file  Transportation Needs: Not on file  Physical Activity: Not on file  Stress: Not on file  Social  Connections: Not on file  Intimate Partner Violence: Not on file   Family History  Problem Relation Age of Onset   Diabetes Mother    Hypertension Mother    Stroke Mother    Hypertrophic cardiomyopathy Mother    Hyperlipidemia Father    Hypertension Father    Heart attack Father    Diabetes Father     Objective: Office vital signs reviewed. BP 122/90   Pulse 73   Temp 98.4 F (36.9 C)   Ht '5\' 11"'  (1.803 m)   Wt 197 lb 9.6 oz (89.6 kg)   SpO2 98%   BMI 27.56 kg/m   Physical Examination:  General: Awake, alert, well nourished, No acute distress HEENT: Normal; sclera white.  No exophthalmos.  No goiter Cardio: regular rate and rhythm, S1S2 heard, no murmurs appreciated Pulm: clear to auscultation bilaterally, no wheezes, rhonchi or rales; normal work of breathing on room air Extremities: warm, well perfused, No edema, cyanosis or clubbing; +2 pulses bilaterally Skin: dry; intact; no rashes or lesions; normal temperature Neuro: No tremor Psych: Tearful.  Appears stressed.  Good eye contact.  Does not appear to be responding to internal stimuli  DECLINED PHQ and GAD-7 though verbalize the GAD-7 was very positive when he read the questions.   Assessment/ Plan: 55 y.o. male   Generalized anxiety disorder - Plan: FLUoxetine (PROZAC) 20 MG capsule  Depression, recurrent ([place]) - Plan: FLUoxetine (PROZAC) 20 MG capsule  Mixed hyperlipidemia - Plan: Lipid Panel, CMP14+EGFR  Postoperative hypothyroidism - Plan: TSH, T4, Free  Trial of Prozac 20 mg daily.  Anticipate need for Wellbutrin if still having issues with uncontrolled depression and/or concentration.  We will plan to reassess in 6 weeks.  Handout provided.  National suicide hotline, [place] crisis hotline and [facility] crisis hotline provided.  Patient is safe to self and others however.  Check fasting lipid panel.  If triglycerides are well controlled then we can consider trial off of fenofibrate and  continue Crestor only.  However given last cholesterol panel I am not sure that he can go without dual therapies.  Check free T4 and TSH.  Aside from depression and anxiety he is asymptomatic from a thyroid standpoint  No orders of the defined types were placed in this encounter.  No orders of the defined types were placed in this encounter.    Janora Norlander, DO [place] [phone]

## 2021-03-08 LAB — CMP14+EGFR
ALT: 64 IU/L — ABNORMAL HIGH (ref 0–44)
AST: 48 IU/L — ABNORMAL HIGH (ref 0–40)
Albumin/Globulin Ratio: 1.7 (ref 1.2–2.2)
Albumin: 4.7 g/dL (ref 3.8–4.9)
Alkaline Phosphatase: 68 IU/L (ref 44–121)
BUN/Creatinine Ratio: 11 (ref 9–20)
BUN: 15 mg/dL (ref 6–24)
Bilirubin Total: 0.4 mg/dL (ref 0.0–1.2)
CO2: 21 mmol/L (ref 20–29)
Calcium: 9.5 mg/dL (ref 8.7–10.2)
Chloride: 102 mmol/L (ref 96–106)
Creatinine, Ser: 1.4 mg/dL — ABNORMAL HIGH (ref 0.76–1.27)
Globulin, Total: 2.7 g/dL (ref 1.5–4.5)
Glucose: 100 mg/dL — ABNORMAL HIGH (ref 65–99)
Potassium: 4.1 mmol/L (ref 3.5–5.2)
Sodium: 143 mmol/L (ref 134–144)
Total Protein: 7.4 g/dL (ref 6.0–8.5)
eGFR: 60 mL/min/{1.73_m2} (ref >59)

## 2021-03-08 LAB — LIPID PANEL
Chol/HDL Ratio: 4.7 ratio (ref 0.0–5.0)
Cholesterol, Total: 194 mg/dL (ref 100–199)
HDL: 41 mg/dL (ref >39)
LDL Chol Calc (NIH): 133 mg/dL — ABNORMAL HIGH (ref 0–99)
Triglycerides: 110 mg/dL (ref 0–149)
VLDL Cholesterol Cal: 20 mg/dL (ref 5–40)

## 2021-03-08 LAB — TSH: TSH: 5.67 u[IU]/mL — ABNORMAL HIGH (ref 0.450–4.500)

## 2021-03-08 LAB — T4, FREE: Free T4: 1.64 ng/dL (ref 0.82–1.77)

## 2021-04-15 ENCOUNTER — Other Ambulatory Visit: Payer: Self-pay | Admitting: Family Medicine

## 2021-04-15 DIAGNOSIS — F5101 Primary insomnia: Secondary | ICD-10-CM

## 2021-04-17 ENCOUNTER — Ambulatory Visit: Payer: 59 | Admitting: Family Medicine

## 2021-04-25 ENCOUNTER — Ambulatory Visit (INDEPENDENT_AMBULATORY_CARE_PROVIDER_SITE_OTHER): Payer: 59 | Admitting: Family Medicine

## 2021-04-25 ENCOUNTER — Other Ambulatory Visit: Payer: Self-pay

## 2021-04-25 ENCOUNTER — Encounter: Payer: Self-pay | Admitting: Family Medicine

## 2021-04-25 VITALS — BP 125/90 | HR 86 | Temp 97.3°F | Ht 71.0 in | Wt 195.0 lb

## 2021-04-25 DIAGNOSIS — Z23 Encounter for immunization: Secondary | ICD-10-CM

## 2021-04-25 DIAGNOSIS — E890 Postprocedural hypothyroidism: Secondary | ICD-10-CM

## 2021-04-25 DIAGNOSIS — F5101 Primary insomnia: Secondary | ICD-10-CM

## 2021-04-25 DIAGNOSIS — F339 Major depressive disorder, recurrent, unspecified: Secondary | ICD-10-CM | POA: Diagnosis not present

## 2021-04-25 DIAGNOSIS — Z79899 Other long term (current) drug therapy: Secondary | ICD-10-CM | POA: Diagnosis not present

## 2021-04-25 DIAGNOSIS — F411 Generalized anxiety disorder: Secondary | ICD-10-CM | POA: Diagnosis not present

## 2021-04-25 DIAGNOSIS — Z1211 Encounter for screening for malignant neoplasm of colon: Secondary | ICD-10-CM

## 2021-04-25 MED ORDER — ALPRAZOLAM 1 MG PO TABS: 0.5000 mg | ORAL_TABLET | Freq: Two times a day (BID) | ORAL | 0 refills | Status: DC | PRN

## 2021-04-25 MED ORDER — ZOLPIDEM TARTRATE 10 MG PO TABS: 5.0000 mg | ORAL_TABLET | Freq: Every evening | ORAL | 5 refills | Status: DC | PRN

## 2021-04-25 MED ORDER — FLUOXETINE HCL 40 MG PO CAPS: 40.0000 mg | ORAL_CAPSULE | Freq: Every day | ORAL | 0 refills | Status: DC

## 2021-04-25 NOTE — Progress Notes (Signed)
Subjective: CC: Depression/ anxiety PCP: Janora Norlander, DO [id number] E Kreiner is a 55 y.o. male presenting to clinic today for:  1.  Depression anxiety Patient was started on Prozac 20 mg daily for depression and anxiety.  He was continued on alprazolam as needed.  He does note that the afternoons seem to be a little bit better but he feels like he is waking up with a panic attack most mornings.  He is not sure if this is related to work as he enjoys his work but does admit that sometimes it is a stressful environment.  He has been having to take 1/2 tablet of the alprazolam intermittently due to these panic symptoms.  Sleep has been fair.  He continues to use Ambien as needed.  No reports of excessive daytime sleepiness, falls, visual or auditory hallucinations.  2.  Hypothyroidism Patient noted to have abnormal thyroid labs with elevation in TSH at the end of June.  He is compliant with his medications.  Would like to have these rechecked  3.  Colon cancer screening Patient got a notice in the mail stating that Cologuard need to be reordered.  He does not report any rectal bleeding.   ROS: Per HPI  No Known Allergies Past Medical History:  Diagnosis Date   Complication of anesthesia    combative waking up after back surgery   DDD (degenerative disc disease), lumbar    Hyperlipidemia    Hypertension    Hypothyroidism     Current Outpatient Medications:    ALPRAZolam (XANAX) 1 MG tablet, Take 0.5-1 tablets (0.5-1 mg total) by mouth 2 (two) times daily as needed for anxiety., Disp: 60 tablet, Rfl: 0   fenofibrate 160 MG tablet, TAKE 1 TABLET DAILY (Patient taking differently: Take 160 mg by mouth daily.), Disp: 30 tablet, Rfl: 3   FLUoxetine (PROZAC) 20 MG capsule, Take 1 capsule (20 mg total) by mouth daily., Disp: 90 capsule, Rfl: 0   hydrocortisone (ANUSOL-HC) 2.5 % rectal cream, Place 1 application rectally 2 (two) times daily. (Patient taking differently: Place 1  application rectally 2 (two) times daily as needed for hemorrhoids.), Disp: 30 g, Rfl: 0   ibuprofen (ADVIL) 200 MG tablet, Take 400 mg by mouth every 8 (eight) hours as needed for moderate pain., Disp: , Rfl:    levothyroxine (SYNTHROID) 150 MCG tablet, Take 1 tablet (150 mcg total) by mouth daily. (Patient taking differently: Take 150 mcg by mouth daily before breakfast.), Disp: 90 tablet, Rfl: 1   losartan-hydrochlorothiazide (HYZAAR) 50-12.5 MG tablet, Take 1 tablet by mouth daily., Disp: 90 tablet, Rfl: 3   rosuvastatin (CRESTOR) 40 MG tablet, Take 1 tablet (40 mg total) by mouth daily., Disp: 90 tablet, Rfl: 3   zolpidem (AMBIEN) 10 MG tablet, Take 0.5-1 tablets (5-10 mg total) by mouth at bedtime as needed for sleep., Disp: 30 tablet, Rfl: 3 Social History   Socioeconomic History   Marital status: Married    Spouse name: Not on file   Number of children: Not on file   Years of education: Not on file   Highest education level: Not on file  Occupational History   Not on file  Tobacco Use   Smoking status: Never   Smokeless tobacco: Never  Vaping Use   Vaping Use: Never used  Substance and Sexual Activity   Alcohol use: No   Drug use: No   Sexual activity: Not on file  Other Topics Concern   Not on file  Social History Narrative   Not on file   Social Determinants of Health   Financial Resource Strain: Not on file  Food Insecurity: Not on file  Transportation Needs: Not on file  Physical Activity: Not on file  Stress: Not on file  Social Connections: Not on file  Intimate Partner Violence: Not on file   Family History  Problem Relation Age of Onset   Diabetes Mother    Hypertension Mother    Stroke Mother    Hypertrophic cardiomyopathy Mother    Hyperlipidemia Father    Hypertension Father    Heart attack Father    Diabetes Father     Objective: Office vital signs reviewed. BP 125/90   Pulse 86   Temp (!) 97.3 F (36.3 C)   Ht '5\' 11"'$  (1.803 m)   Wt 195  lb (88.5 kg)   SpO2 97%   BMI 27.20 kg/m   Physical Examination:  General: Awake, alert, well nourished, No acute distress HEENT: Normal; notes of thalmus.  No goiter Cardio: regular rate and rhythm, S1S2 heard, no murmurs appreciated Pulm: clear to auscultation bilaterally, no wheezes, rhonchi or rales; normal work of breathing on room air Psych: Affect flat.  Mood somewhat depressed.  Patient is pleasant and interactive  Depression screen [facility] 2/9 01/19/2021 12/08/2020 08/26/2020  Decreased Interest 0 0 0  Down, Depressed, Hopeless 0 0 0  PHQ - 2 Score 0 0 0  Altered sleeping - - 0  Tired, decreased energy - - 0  Change in appetite - - 0  Feeling bad or failure about yourself  - - 0  Trouble concentrating - - 0  Moving slowly or fidgety/restless - - 0  Suicidal thoughts - - 0  PHQ-9 Score - - 0  Difficult doing work/chores - - -  Some recent data might be hidden   GAD 7 : Generalized Anxiety Score 04/20/2020 10/27/2019 07/27/2019 08/22/2018  Nervous, Anxious, on Edge '1 3 1 1  '$ Control/stop worrying '1 3 1 3  '$ Worry too much - different things '1 3 1 3  '$ Trouble relaxing 2 1 0 3  Restless 0 0 0 0  Easily annoyed or irritable 0 0 1 1  Afraid - awful might happen 0 3 0 3  Total GAD 7 Score '5 13 4 14  '$ Anxiety Difficulty - Very difficult Somewhat difficult -    Assessment/ Plan: 55 y.o. male   Depression, recurrent ([place]) - Plan: FLUoxetine (PROZAC) 40 MG capsule  GAD (generalized anxiety disorder) - Plan: ALPRAZolam (XANAX) 1 MG tablet, Drug Screen 10 W/Conf, Se  Primary insomnia - Plan: zolpidem (AMBIEN) 10 MG tablet, Drug Screen 10 W/Conf, Se  Controlled substance agreement signed - Plan: Drug Screen 10 W/Conf, Se  Postoperative hypothyroidism - Plan: TSH, T4, Free  Screen for colon cancer - Plan: Cologuard  Depression and anxiety are not well controlled.  Increase fluoxetine to 40 mg daily, continue as needed alprazolam.  UDS and CSC were obtained as per office policy  today  Ambien renewed for as needed use. The Narcotic Database has been reviewed.  There were no red flags.    Check thyroid levels.  Cologuard renewed  No orders of the defined types were placed in this encounter.  No orders of the defined types were placed in this encounter.    Janora Norlander, DO [place] [phone]

## 2021-04-25 NOTE — Patient Instructions (Signed)

## 2021-04-26 ENCOUNTER — Other Ambulatory Visit: Payer: Self-pay | Admitting: Family Medicine

## 2021-04-26 DIAGNOSIS — E890 Postprocedural hypothyroidism: Secondary | ICD-10-CM

## 2021-04-26 MED ORDER — LEVOTHYROXINE SODIUM 137 MCG PO TABS
137.0000 ug | ORAL_TABLET | Freq: Every day | ORAL | 0 refills | Status: DC
Start: 2021-04-26 — End: 2021-07-27

## 2021-04-27 LAB — SPECIMEN STATUS REPORT

## 2021-04-27 LAB — HEPATIC FUNCTION PANEL
ALT: 73 IU/L — ABNORMAL HIGH (ref 0–44)
AST: 49 IU/L — ABNORMAL HIGH (ref 0–40)
Albumin: 4.8 g/dL (ref 3.8–4.9)
Alkaline Phosphatase: 93 IU/L (ref 44–121)
Bilirubin Total: 0.6 mg/dL (ref 0.0–1.2)
Bilirubin, Direct: 0.15 mg/dL (ref 0.00–0.40)
Total Protein: 7.1 g/dL (ref 6.0–8.5)

## 2021-05-06 LAB — BENZODIAZEPINES,MS,WB/SP RFX
7-Aminoclonazepam: NEGATIVE ng/mL
Alprazolam: 33.9 ng/mL
Benzodiazepines Confirm: POSITIVE
Chlordiazepoxide: NEGATIVE
Clonazepam: NEGATIVE ng/mL
Desalkylflurazepam: NEGATIVE ng/mL
Desmethylchlordiazepoxide: NEGATIVE
Desmethyldiazepam: NEGATIVE ng/mL
Diazepam: NEGATIVE ng/mL
Flurazepam: NEGATIVE ng/mL
Lorazepam: NEGATIVE ng/mL
Midazolam: NEGATIVE ng/mL
Oxazepam: NEGATIVE ng/mL
Temazepam: NEGATIVE ng/mL
Triazolam: NEGATIVE ng/mL

## 2021-05-06 LAB — DRUG SCREEN 10 W/CONF, SERUM
Amphetamines, IA: NEGATIVE ng/mL
Barbiturates, IA: NEGATIVE ug/mL
Benzodiazepines, IA: POSITIVE ng/mL — AB
Cocaine & Metabolite, IA: NEGATIVE ng/mL
Methadone, IA: NEGATIVE ng/mL
Opiates, IA: NEGATIVE ng/mL
Oxycodones, IA: NEGATIVE ng/mL
Phencyclidine, IA: NEGATIVE ng/mL
Propoxyphene, IA: NEGATIVE ng/mL
THC(Marijuana) Metabolite, IA: NEGATIVE ng/mL

## 2021-05-06 LAB — TSH: TSH: 1.25 u[IU]/mL (ref 0.450–4.500)

## 2021-05-06 LAB — T4, FREE: Free T4: 1.94 ng/dL — ABNORMAL HIGH (ref 0.82–1.77)

## 2021-05-08 LAB — COLOGUARD: Cologuard: NEGATIVE

## 2021-05-09 ENCOUNTER — Ambulatory Visit: Payer: 59 | Admitting: Family Medicine

## 2021-05-17 ENCOUNTER — Other Ambulatory Visit: Payer: Self-pay | Admitting: Family Medicine

## 2021-05-17 DIAGNOSIS — F411 Generalized anxiety disorder: Secondary | ICD-10-CM

## 2021-06-20 IMAGING — DX DG HAND 2V*L*
3 series · 3 of 3 positions shown · non-contrast
Comparison: None.

CLINICAL DATA: Multifocal joint pain.

EXAM:
RIGHT HAND - 2 VIEW; LEFT HAND - 2 VIEW

[hand pa (1 of 2)]
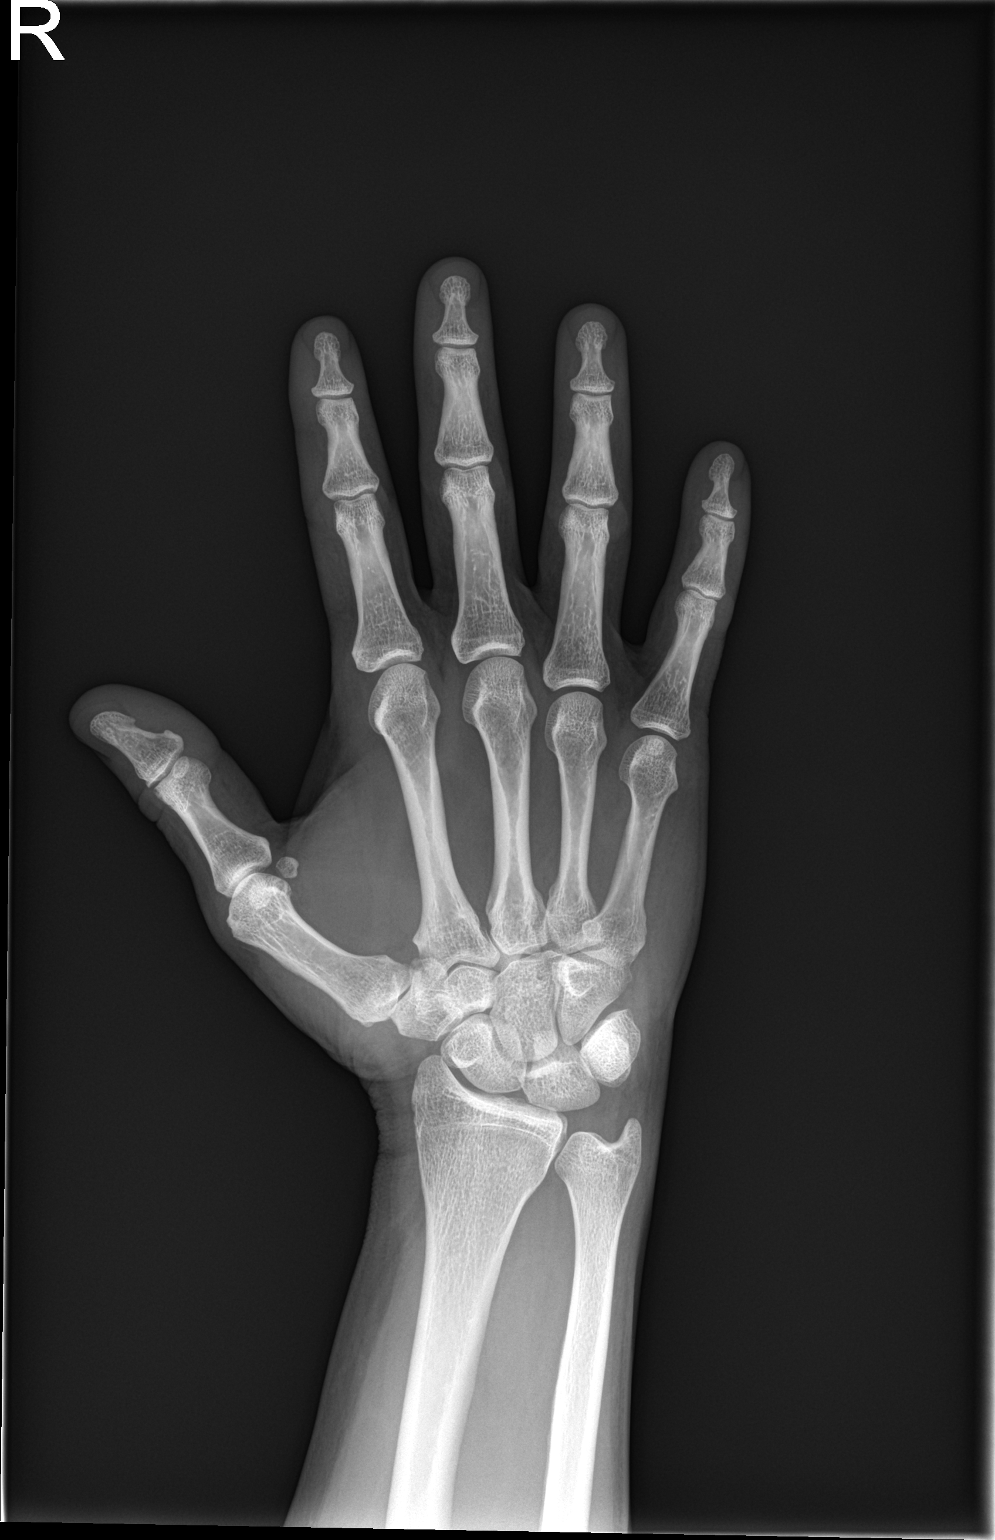

[hand lat]
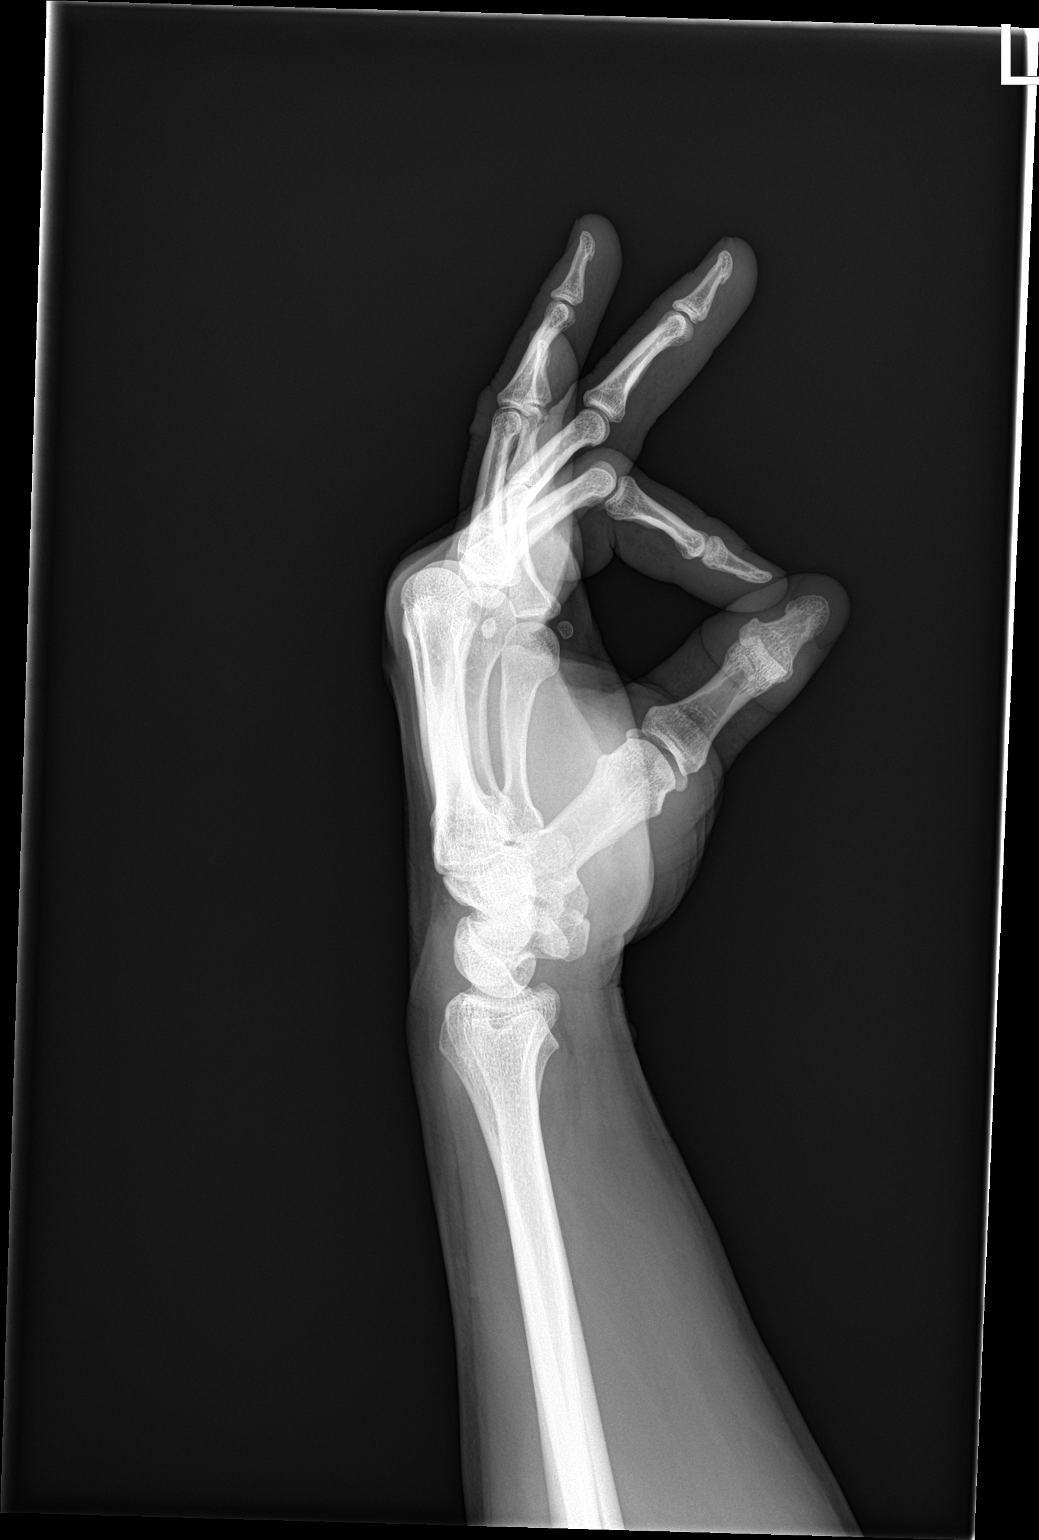

[hand pa (2 of 2)]
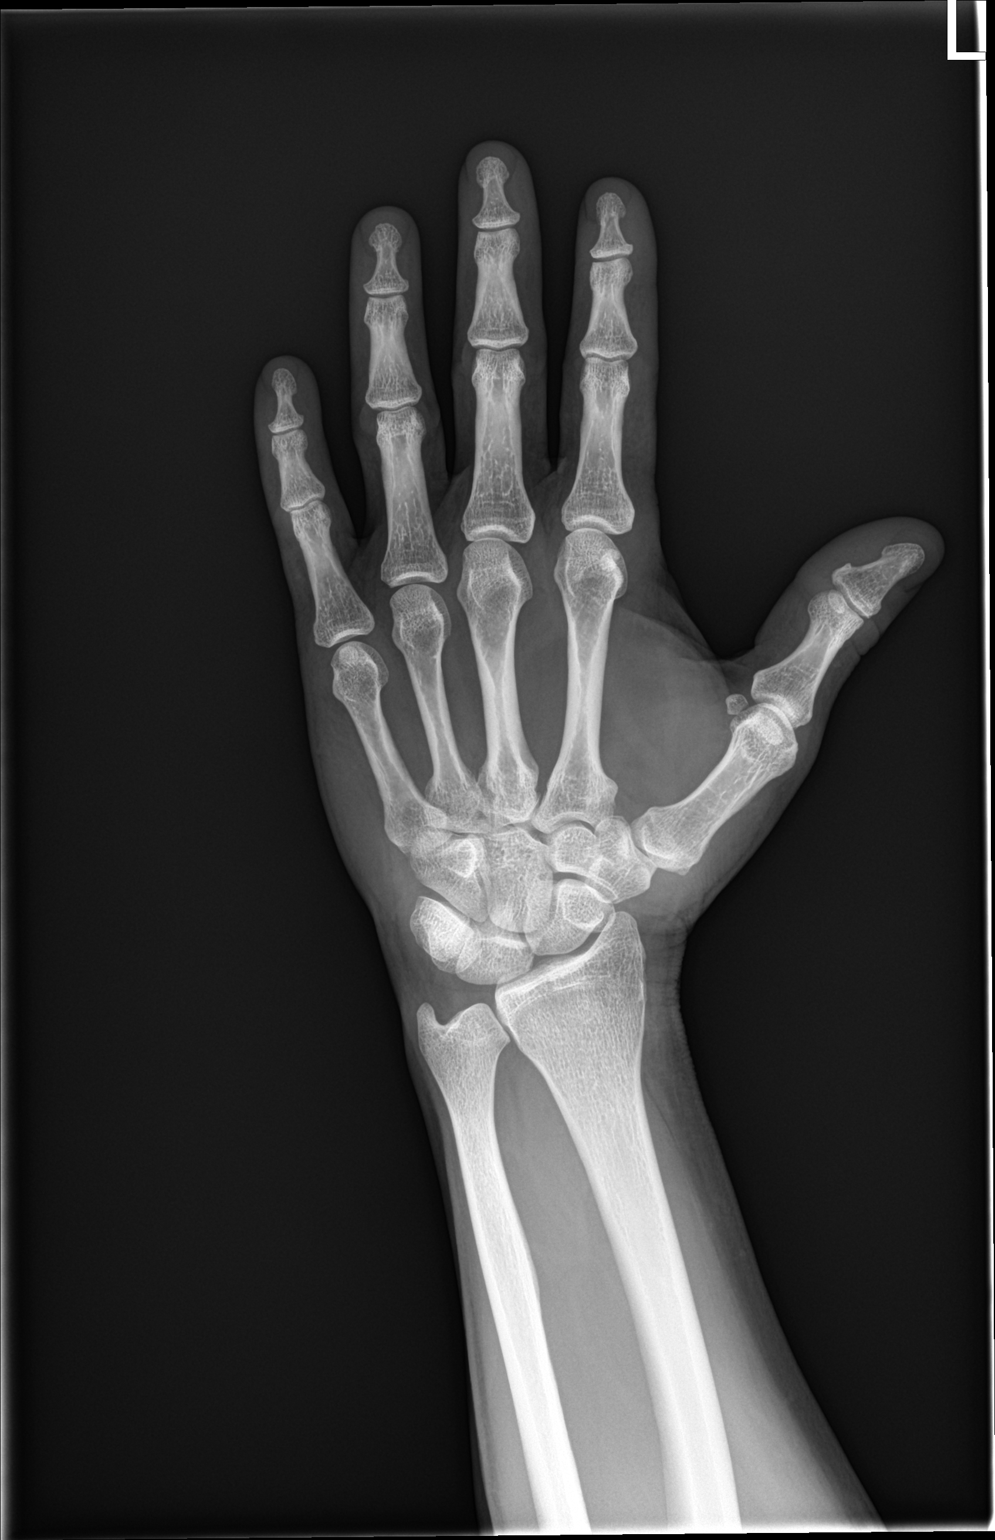

[3 of 3 positions shown; findings below may reference images not displayed]

FINDINGS: Joint spaces and alignment are normal. Mineralization is normal. No
erosion, osteophytosis or periostitis. No fracture or dislocation.
No chondrocalcinosis. Soft tissues are negative.
IMPRESSION: Normal exam.

## 2021-06-23 ENCOUNTER — Other Ambulatory Visit: Payer: Self-pay

## 2021-06-23 ENCOUNTER — Encounter: Payer: Self-pay | Admitting: Family Medicine

## 2021-06-23 ENCOUNTER — Ambulatory Visit (INDEPENDENT_AMBULATORY_CARE_PROVIDER_SITE_OTHER): Payer: 59 | Admitting: Family Medicine

## 2021-06-23 VITALS — BP 131/87 | HR 72 | Temp 97.8°F | Ht 71.0 in | Wt 193.6 lb

## 2021-06-23 DIAGNOSIS — I10 Essential (primary) hypertension: Secondary | ICD-10-CM

## 2021-06-23 DIAGNOSIS — E890 Postprocedural hypothyroidism: Secondary | ICD-10-CM

## 2021-06-23 DIAGNOSIS — F411 Generalized anxiety disorder: Secondary | ICD-10-CM

## 2021-06-23 DIAGNOSIS — Z23 Encounter for immunization: Secondary | ICD-10-CM

## 2021-06-23 DIAGNOSIS — F339 Major depressive disorder, recurrent, unspecified: Secondary | ICD-10-CM

## 2021-06-23 DIAGNOSIS — E782 Mixed hyperlipidemia: Secondary | ICD-10-CM

## 2021-06-23 DIAGNOSIS — H9202 Otalgia, left ear: Secondary | ICD-10-CM

## 2021-06-23 MED ORDER — ALPRAZOLAM 1 MG PO TABS: 0.5000 mg | ORAL_TABLET | Freq: Two times a day (BID) | ORAL | 0 refills | Status: DC | PRN

## 2021-06-23 MED ORDER — ROSUVASTATIN CALCIUM 40 MG PO TABS: 40.0000 mg | ORAL_TABLET | Freq: Every day | ORAL | 3 refills | Status: DC

## 2021-06-23 MED ORDER — LOSARTAN POTASSIUM-HCTZ 50-12.5 MG PO TABS: 1.0000 | ORAL_TABLET | Freq: Every day | ORAL | 3 refills | Status: DC

## 2021-06-23 MED ORDER — NAPROXEN 500 MG PO TABS: 500.0000 mg | ORAL_TABLET | Freq: Two times a day (BID) | ORAL | 3 refills | Status: DC | PRN

## 2021-06-23 MED ORDER — HYDROCORTISONE (PERIANAL) 2.5 % EX CREA: 1.0000 "application " | TOPICAL_CREAM | Freq: Two times a day (BID) | CUTANEOUS | 1 refills | Status: DC | PRN

## 2021-06-23 NOTE — Progress Notes (Signed)
Subjective: CC: Follow-up anxiety and depression PCP: Janora Norlander, DO Kenneth [name] is a 55 y.o. male presenting to clinic today for:  1.  Anxiety and depression Patient was seen in August.  That time his anxiety depression are not well controlled and he is experiencing panic symptoms in the afternoon.  We will increase his fluoxetine to 40 mg daily and continued his as needed alprazolam and Ambien.  Today he reports that he is not able to tolerate fluoxetine at all.  He reduced to 40 mg back to 20 mg for couple days and then discontinue.  He does report that the anxiety has gotten tremendously better since discontinuing the Prozac and just utilizing Ambien more frequently so that he has better rest.  He is only had to use 1 Xanax in the last week.  One of his sons recently quit working for him and the other son is still working with him.  Have not had any run-ins lately.  2.  Hypothyroidism At last visit we found his free T4 to be elevated.  This was thought to possibly be contributory to the anxiety symptoms he was experiencing in the afternoon.  We reduced his Synthroid to 137 mcg daily and advised him to return in 6 weeks for repeat TSH and free T4.  He feels that anxiety has gotten better.  Hopefully this is reflected in his thyroid levels.  No reports of change in bowel habits or energy except for the better.  3.  Hypertension with hyperlipidemia. He is compliant with medications.  He is fasting would like to have cholesterol repeated today.  4.  Otalgia Patient with ongoing otalgia in the left ear.  He tried using Debrox and this caused some burning in the ear so he discontinued.  He still has slight discomfort but no drainage, dizziness or fevers reported   ROS: Per HPI  No Known Allergies Past Medical History:  Diagnosis Date   Complication of anesthesia    combative waking up after back surgery   DDD (degenerative disc disease), lumbar    Hyperlipidemia     Hypertension    Hypothyroidism     Current Outpatient Medications:    ALPRAZolam (XANAX) 1 MG tablet, Take 0.5-1 tablets (0.5-1 mg total) by mouth 2 (two) times daily as needed for anxiety., Disp: 60 tablet, Rfl: 0   fenofibrate 160 MG tablet, TAKE 1 TABLET DAILY (Patient taking differently: Take 160 mg by mouth daily.), Disp: 30 tablet, Rfl: 3   FLUoxetine (PROZAC) 40 MG capsule, Take 1 capsule (40 mg total) by mouth daily., Disp: 90 capsule, Rfl: 0   hydrocortisone (ANUSOL-HC) 2.5 % rectal cream, Place 1 application rectally 2 (two) times daily. (Patient taking differently: Place 1 application rectally 2 (two) times daily as needed for hemorrhoids.), Disp: 30 g, Rfl: 0   ibuprofen (ADVIL) 200 MG tablet, Take 400 mg by mouth every 8 (eight) hours as needed for moderate pain., Disp: , Rfl:    levothyroxine (SYNTHROID) 137 MCG tablet, Take 1 tablet (137 mcg total) by mouth daily before breakfast., Disp: 90 tablet, Rfl: 0   losartan-hydrochlorothiazide (HYZAAR) 50-12.5 MG tablet, Take 1 tablet by mouth daily., Disp: 90 tablet, Rfl: 3   rosuvastatin (CRESTOR) 40 MG tablet, Take 1 tablet (40 mg total) by mouth daily., Disp: 90 tablet, Rfl: 3   zolpidem (AMBIEN) 10 MG tablet, Take 0.5-1 tablets (5-10 mg total) by mouth at bedtime as needed for sleep., Disp: 30 tablet, Rfl: 5 Social History  Socioeconomic History   Marital status: Married    Spouse name: Not on file   Number of children: Not on file   Years of education: Not on file   Highest education level: Not on file  Occupational History   Not on file  Tobacco Use   Smoking status: Never   Smokeless tobacco: Never  Vaping Use   Vaping Use: Never used  Substance and Sexual Activity   Alcohol use: No   Drug use: No   Sexual activity: Not on file  Other Topics Concern   Not on file  Social History Narrative   Not on file   Social Determinants of Health   Financial Resource Strain: Not on file  Food Insecurity: Not on file   Transportation Needs: Not on file  Physical Activity: Not on file  Stress: Not on file  Social Connections: Not on file  Intimate Partner Violence: Not on file   Family History  Problem Relation Age of Onset   Diabetes Mother    Hypertension Mother    Stroke Mother    Hypertrophic cardiomyopathy Mother    Hyperlipidemia Father    Hypertension Father    Heart attack Father    Diabetes Father     Objective: Office vital signs reviewed. BP 131/87   Pulse 72   Temp 97.8 F (36.6 C)   Ht '5\' 11"'  (1.803 m)   Wt 193 lb 9.6 oz (87.8 kg)   SpO2 97%   BMI 27.00 kg/m   Physical Examination:  General: Awake, alert, well nourished, No acute distress HEENT: Normal; sclera white.  No exophthalmos; bilateral TMs intact with normal light reflex.  No bulging.  No erythema or drainage Cardio: regular rate and rhythm, S1S2 heard, no murmurs appreciated Pulm: clear to auscultation bilaterally, no wheezes, rhonchi or rales; normal work of breathing on room air Extremities: warm, well perfused, No edema, cyanosis or clubbing; +2 pulses bilaterally Psych: Mood stable, speech normal, affect appropriate Depression screen [facility] 2/9 01/19/2021 12/08/2020 08/26/2020  Decreased Interest 0 0 0  Down, Depressed, Hopeless 0 0 0  PHQ - 2 Score 0 0 0  Altered sleeping - - 0  Tired, decreased energy - - 0  Change in appetite - - 0  Feeling bad or failure about yourself  - - 0  Trouble concentrating - - 0  Moving slowly or fidgety/restless - - 0  Suicidal thoughts - - 0  PHQ-9 Score - - 0  Difficult doing work/chores - - -  Some recent data might be hidden   GAD 7 : Generalized Anxiety Score 04/20/2020 10/27/2019 07/27/2019 08/22/2018  Nervous, Anxious, on Edge '1 3 1 1  ' Control/stop worrying '1 3 1 3  ' Worry too much - different things '1 3 1 3  ' Trouble relaxing 2 1 0 3  Restless 0 0 0 0  Easily annoyed or irritable 0 0 1 1  Afraid - awful might happen 0 3 0 3  Total GAD 7 Score '5 13 4 14  ' Anxiety  Difficulty - Very difficult Somewhat difficult -   Assessment/ Plan: 55 y.o. male   Depression, recurrent (HCC)  GAD (generalized anxiety disorder) - Plan: ALPRAZolam (XANAX) 1 MG tablet  Postoperative hypothyroidism - Plan: CMP14+EGFR  Mixed hyperlipidemia - Plan: Lipid Panel, CMP14+EGFR, rosuvastatin (CRESTOR) 40 MG tablet  Earache on left  Essential hypertension - Plan: losartan-hydrochlorothiazide (HYZAAR) 50-12.5 MG tablet  Need for immunization against influenza - Plan: Flu Vaccine QUAD 29moIM (Fluarix, Fluzone &  Alfiuria Quad PF)  Anxiety and depression are better off of the SSRI.  Xanax renewed for as needed use though did not need a refill just yet.  Caution sparing use and he seems to be doing this now that he is off of the SSRI  Suspect that improved thyroid levels have improved anxiety as well.  Check fasting lipid, CMP.  Continue rosuvastatin, fenofibrate  Earache of uncertain etiology.  His exam not demonstrate any abnormalities.  Blood pressures well controlled.  Hyzaar renewed  Influenza and shingles vaccination administered today   No orders of the defined types were placed in this encounter.  No orders of the defined types were placed in this encounter.    Janora Norlander, DO [place] [phone]

## 2021-06-24 LAB — CMP14+EGFR
ALT: 76 IU/L — ABNORMAL HIGH (ref 0–44)
AST: 57 IU/L — ABNORMAL HIGH (ref 0–40)
Albumin/Globulin Ratio: 2.4 — ABNORMAL HIGH (ref 1.2–2.2)
Albumin: 4.7 g/dL (ref 3.8–4.9)
Alkaline Phosphatase: 105 IU/L (ref 44–121)
BUN/Creatinine Ratio: 13 (ref 9–20)
BUN: 15 mg/dL (ref 6–24)
Bilirubin Total: 0.7 mg/dL (ref 0.0–1.2)
CO2: 20 mmol/L (ref 20–29)
Calcium: 9.1 mg/dL (ref 8.7–10.2)
Chloride: 102 mmol/L (ref 96–106)
Creatinine, Ser: 1.18 mg/dL (ref 0.76–1.27)
Globulin, Total: 2 g/dL (ref 1.5–4.5)
Glucose: 97 mg/dL (ref 70–99)
Potassium: 4.1 mmol/L (ref 3.5–5.2)
Sodium: 141 mmol/L (ref 134–144)
Total Protein: 6.7 g/dL (ref 6.0–8.5)
eGFR: 73 mL/min/{1.73_m2} (ref >59)

## 2021-06-24 LAB — LIPID PANEL
Chol/HDL Ratio: 4.4 ratio (ref 0.0–5.0)
Cholesterol, Total: 169 mg/dL (ref 100–199)
HDL: 38 mg/dL — ABNORMAL LOW (ref >39)
LDL Chol Calc (NIH): 96 mg/dL (ref 0–99)
Triglycerides: 207 mg/dL — ABNORMAL HIGH (ref 0–149)
VLDL Cholesterol Cal: 35 mg/dL (ref 5–40)

## 2021-06-24 LAB — T4, FREE: Free T4: 1.35 ng/dL (ref 0.82–1.77)

## 2021-06-24 LAB — TSH: TSH: 7.03 u[IU]/mL — ABNORMAL HIGH (ref 0.450–4.500)

## 2021-06-26 ENCOUNTER — Other Ambulatory Visit: Payer: Self-pay | Admitting: Family Medicine

## 2021-06-26 DIAGNOSIS — E890 Postprocedural hypothyroidism: Secondary | ICD-10-CM

## 2021-06-26 MED ORDER — LEVOTHYROXINE SODIUM 150 MCG PO TABS: 150.0000 ug | ORAL_TABLET | Freq: Every day | ORAL | 0 refills | Status: DC

## 2021-06-29 NOTE — Addendum Note (Signed)
Addended by: Everlean Cherry on: 06/29/2021 03:39 PM   Modules accepted: Orders

## 2021-07-27 ENCOUNTER — Other Ambulatory Visit: Payer: Self-pay | Admitting: Family Medicine

## 2021-07-27 DIAGNOSIS — E890 Postprocedural hypothyroidism: Secondary | ICD-10-CM

## 2021-07-27 DIAGNOSIS — F411 Generalized anxiety disorder: Secondary | ICD-10-CM

## 2021-08-31 ENCOUNTER — Encounter: Payer: Self-pay | Admitting: Nurse Practitioner

## 2021-08-31 ENCOUNTER — Ambulatory Visit (INDEPENDENT_AMBULATORY_CARE_PROVIDER_SITE_OTHER): Payer: 59 | Admitting: Nurse Practitioner

## 2021-08-31 VITALS — BP 131/87 | HR 54 | Temp 98.4°F | Resp 20 | Ht 71.0 in | Wt 198.0 lb

## 2021-08-31 DIAGNOSIS — F411 Generalized anxiety disorder: Secondary | ICD-10-CM | POA: Diagnosis not present

## 2021-08-31 DIAGNOSIS — M5442 Lumbago with sciatica, left side: Secondary | ICD-10-CM | POA: Diagnosis not present

## 2021-08-31 MED ORDER — PREDNISONE 20 MG PO TABS: 40.0000 mg | ORAL_TABLET | Freq: Every day | ORAL | 0 refills | Status: AC

## 2021-08-31 MED ORDER — ALPRAZOLAM 1 MG PO TABS: 0.5000 mg | ORAL_TABLET | Freq: Two times a day (BID) | ORAL | 0 refills | Status: DC | PRN

## 2021-08-31 MED ORDER — KETOROLAC TROMETHAMINE 60 MG/2ML IM SOLN
60.0000 mg | Freq: Once | INTRAMUSCULAR | Status: AC
  Administered 2021-08-31: 12:00:00 60 mg via INTRAMUSCULAR

## 2021-08-31 MED ORDER — PREDNISONE 20 MG PO TABS: 40.0000 mg | ORAL_TABLET | Freq: Every day | ORAL | 0 refills | Status: DC

## 2021-08-31 MED ORDER — METHYLPREDNISOLONE ACETATE 80 MG/ML IJ SUSP
80.0000 mg | Freq: Once | INTRAMUSCULAR | Status: AC
  Administered 2021-08-31: 12:00:00 80 mg via INTRAMUSCULAR

## 2021-08-31 NOTE — Progress Notes (Signed)
Prednisone Rx failed. resent

## 2021-08-31 NOTE — Addendum Note (Signed)
Addended by: Antonietta Barcelona D on: 08/31/2021 03:41 PM   Modules accepted: Orders

## 2021-08-31 NOTE — Progress Notes (Signed)
Subjective:    Patient ID: Kenneth [name], male    DOB: 08-Oct-1965, 55 y.o.   MRN: [id number]   Chief Complaint: Back Pain   HPI Patient comes in today c/o recurrent back pain. He has a long history of back pain that flares up several times a year. Started hurting again several days ago. Rates back pain 7-8/10. Certain movements will cause it to catch. Radiates down left leg at times. Nothing has really helped it. He uses ice and that helps a little.    Review of Systems  Constitutional:  Negative for diaphoresis.  Eyes:  Negative for pain.  Respiratory:  Negative for shortness of breath.   Cardiovascular:  Negative for chest pain, palpitations and leg swelling.  Gastrointestinal:  Negative for abdominal pain.  Endocrine: Negative for polydipsia.  Skin:  Negative for rash.  Neurological:  Negative for dizziness, weakness and headaches.  Hematological:  Does not bruise/bleed easily.  All other systems reviewed and are negative.     Objective:   Physical Exam Vitals and nursing note reviewed.  Constitutional:      Appearance: Normal appearance. He is well-developed.  Neck:     Thyroid: No thyroid mass or thyromegaly.     Vascular: No carotid bruit or JVD.     Trachea: Phonation normal.  Cardiovascular:     Rate and Rhythm: Normal rate and regular rhythm.  Pulmonary:     Effort: Pulmonary effort is normal. No respiratory distress.     Breath sounds: Normal breath sounds.  Abdominal:     General: Bowel sounds are normal.     Palpations: Abdomen is soft.     Tenderness: There is no abdominal tenderness.  Musculoskeletal:        General: Normal range of motion.     Cervical back: Normal range of motion and neck supple.     Comments: FROM of lumbar spine with pain on flexion and extension (+)slr on left Motor strength and sensation distally intact  Lymphadenopathy:     Cervical: No cervical adenopathy.  Skin:    General: Skin is warm and dry.  Neurological:      Mental Status: He is alert and oriented to person, place, and time.  Psychiatric:        Behavior: Behavior normal.        Thought Content: Thought content normal.        Judgment: Judgment normal.    BP 131/87    Pulse (!) 54    Temp 98.4 F (36.9 C) (Temporal)    Resp 20    Ht 5\' 11"  (1.803 m)    Wt 198 lb (89.8 kg)    BMI 27.62 kg/m        Assessment & Plan:   Glade Stanford in today with chief complaint of Back Pain   1. Acute midline low back pain with left-sided sciatica Moist heat or ice- whichever helos - methylPREDNISolone acetate (DEPO-MEDROL) injection 80 mg - ketorolac (TORADOL) injection 60 mg - predniSONE (DELTASONE) 20 MG tablet; Take 2 tablets (40 mg total) by mouth daily with breakfast for 5 days. 2 po daily for 5 days  Dispense: 10 tablet; Refill: 0  2. GAD (generalized anxiety disorder) Stress management Need  to follow  up with PCP - ALPRAZolam (XANAX) 1 MG tablet; Take 0.5-1 tablets (0.5-1 mg total) by mouth 2 (two) times daily as needed for anxiety.  Dispense: 60 tablet; Refill: 0    The above assessment and  management plan was discussed with the patient. The patient verbalized understanding of and has agreed to the management plan. Patient is aware to call the clinic if symptoms persist or worsen. Patient is aware when to return to the clinic for a follow-up visit. Patient educated on when it is appropriate to go to the emergency department.   Mary-Margaret Hassell Done, FNP

## 2021-08-31 NOTE — Addendum Note (Signed)
Addended by: Chevis Pretty on: 08/31/2021 04:22 PM   Modules accepted: Orders

## 2021-08-31 NOTE — Patient Instructions (Signed)

## 2021-09-12 ENCOUNTER — Ambulatory Visit (INDEPENDENT_AMBULATORY_CARE_PROVIDER_SITE_OTHER): Payer: 59 | Admitting: Family Medicine

## 2021-09-12 ENCOUNTER — Encounter: Payer: Self-pay | Admitting: Family Medicine

## 2021-09-12 VITALS — BP 130/89 | HR 101 | Temp 97.4°F | Ht 71.0 in | Wt 194.0 lb

## 2021-09-12 DIAGNOSIS — F339 Major depressive disorder, recurrent, unspecified: Secondary | ICD-10-CM | POA: Diagnosis not present

## 2021-09-12 DIAGNOSIS — F411 Generalized anxiety disorder: Secondary | ICD-10-CM | POA: Diagnosis not present

## 2021-09-12 DIAGNOSIS — E782 Mixed hyperlipidemia: Secondary | ICD-10-CM | POA: Diagnosis not present

## 2021-09-12 DIAGNOSIS — E890 Postprocedural hypothyroidism: Secondary | ICD-10-CM

## 2021-09-12 MED ORDER — ALPRAZOLAM 1 MG PO TABS: 0.5000 mg | ORAL_TABLET | Freq: Two times a day (BID) | ORAL | 2 refills | Status: DC | PRN

## 2021-09-12 MED ORDER — DESVENLAFAXINE SUCCINATE ER 50 MG PO TB24: 50.0000 mg | ORAL_TABLET | Freq: Every day | ORAL | 3 refills | Status: DC

## 2021-09-12 NOTE — Progress Notes (Signed)
Subjective: CC: Anxiety depression PCP: Janora Norlander, DO IOM:BTDHRCB Kenneth [name] is a 56 y.o. male presenting to clinic today for:  1.  Anxiety and depression Patient reports he is essentially reverted back to how he was prior to being started on Prozac.  He reports quite a bit of depressive symptoms and finds it difficult to even get out of bed.  He often will have to take half a tablet of alprazolam at 5 AM to even have enough motivation to start his day.  He reports major depressive symptoms but denies any SI or HI.  He essentially just works until he feels like he can climb back in bed.  He has been using his alprazolam fairly regularly at 1.5 mg total throughout the day with occasionally a full 2 mg.  He certainly feels like he needs it more but he does not want to use more than prescribed  No reports of excessive daytime sedation, falls or respiratory depression.  2.  Hypothyroidism Reports depressive symptoms exacerbated over the last several weeks as above.  No reports of change in bowel habits.   ROS: Per HPI  No Known Allergies Past Medical History:  Diagnosis Date   Complication of anesthesia    combative waking up after back surgery   DDD (degenerative disc disease), lumbar    Hyperlipidemia    Hypertension    Hypothyroidism     Current Outpatient Medications:    desvenlafaxine (PRISTIQ) 50 MG 24 hr tablet, Take 1 tablet (50 mg total) by mouth daily., Disp: 90 tablet, Rfl: 3   fenofibrate 160 MG tablet, TAKE 1 TABLET DAILY (Patient taking differently: Take 160 mg by mouth daily.), Disp: 30 tablet, Rfl: 3   hydrocortisone (ANUSOL-HC) 2.5 % rectal cream, Place 1 application rectally 2 (two) times daily as needed for hemorrhoids., Disp: 28 g, Rfl: 1   levothyroxine (SYNTHROID) 137 MCG tablet, TAKE ONE TABLET ONCE DAILY BEFORE BREAKFAST, Disp: 90 tablet, Rfl: 0   levothyroxine (SYNTHROID) 150 MCG tablet, Take 1 tablet (150 mcg total) by mouth daily. Saturdays and Sundays  only, Disp: 30 tablet, Rfl: 0   losartan-hydrochlorothiazide (HYZAAR) 50-12.5 MG tablet, Take 1 tablet by mouth daily., Disp: 90 tablet, Rfl: 3   naproxen (NAPROSYN) 500 MG tablet, Take 1 tablet (500 mg total) by mouth 2 (two) times daily as needed for moderate pain., Disp: 60 tablet, Rfl: 3   rosuvastatin (CRESTOR) 40 MG tablet, Take 1 tablet (40 mg total) by mouth daily., Disp: 90 tablet, Rfl: 3   zolpidem (AMBIEN) 10 MG tablet, Take 0.5-1 tablets (5-10 mg total) by mouth at bedtime as needed for sleep., Disp: 30 tablet, Rfl: 5   ALPRAZolam (XANAX) 1 MG tablet, Take 0.5-1 tablets (0.5-1 mg total) by mouth 2 (two) times daily as needed for anxiety (place on hold)., Disp: 60 tablet, Rfl: 2 Social History   Socioeconomic History   Marital status: Married    Spouse name: Not on file   Number of children: Not on file   Years of education: Not on file   Highest education level: Not on file  Occupational History   Not on file  Tobacco Use   Smoking status: Never   Smokeless tobacco: Never  Vaping Use   Vaping Use: Never used  Substance and Sexual Activity   Alcohol use: No   Drug use: No   Sexual activity: Not on file  Other Topics Concern   Not on file  Social History Narrative   Not  on file   Social Determinants of Health   Financial Resource Strain: Not on file  Food Insecurity: Not on file  Transportation Needs: Not on file  Physical Activity: Not on file  Stress: Not on file  Social Connections: Not on file  Intimate Partner Violence: Not on file   Family History  Problem Relation Age of Onset   Diabetes Mother    Hypertension Mother    Stroke Mother    Hypertrophic cardiomyopathy Mother    Hyperlipidemia Father    Hypertension Father    Heart attack Father    Diabetes Father     Objective: Office vital signs reviewed. BP 130/89    Pulse (!) 101    Temp (!) 97.4 F (36.3 C)    Ht 5\' 11"  (1.803 m)    Wt 194 lb (88 kg)    SpO2 98%    BMI 27.06 kg/m   Physical  Examination:  General: Awake, alert, nontoxic, No acute distress HEENT: No exophthalmos or goiter Cardio: regular rate and rhythm, S1S2 heard, no murmurs appreciated Pulm: clear to auscultation bilaterally, no wheezes, rhonchi or rales; normal work of breathing on room air Psych: Depressed.   Assessment/ Plan: 56 y.o. male   GAD (generalized anxiety disorder) - Plan: ALPRAZolam (XANAX) 1 MG tablet, desvenlafaxine (PRISTIQ) 50 MG 24 hr tablet  Depression, recurrent (HCC) - Plan: desvenlafaxine (PRISTIQ) 50 MG 24 hr tablet  Mixed hyperlipidemia - Plan: LDL Cholesterol, Direct  Postoperative hypothyroidism - Plan: TSH, T4, Free  Anxiety depression are not well controlled.  He declined filling out the PHQ and GAD-7 today.  I am starting him on Pristiq 50 mg daily as he had intolerance to Prozac due to increased anxiety disorder.  I wonder if pursuing norepinephrine versus serotonin treatment would be more helpful to him.  I have renewed the alprazolam but we did discuss increased frequency of use as of late.  He initially was only requiring 1 bottle per every 6 to 12 months whereas he has been refilling it much more frequently over the last 4 months.  I do worry about his increased need and we discussed potential for dependence and overall decreased efficacy as he uses it more often.  Goal would be to titrate off of the medication again  Check thyroid levels.  Check direct LDL given nonfasting status  Orders Placed This Encounter  Procedures   TSH   T4, Free   LDL Cholesterol, Direct   Meds ordered this encounter  Medications   ALPRAZolam (XANAX) 1 MG tablet    Sig: Take 0.5-1 tablets (0.5-1 mg total) by mouth 2 (two) times daily as needed for anxiety (place on hold).    Dispense:  60 tablet    Refill:  2   desvenlafaxine (PRISTIQ) 50 MG 24 hr tablet    Sig: Take 1 tablet (50 mg total) by mouth daily.    Dispense:  90 tablet    Refill:  [place], [place] [phone]

## 2021-09-13 LAB — LDL CHOLESTEROL, DIRECT: LDL Direct: 125 mg/dL — ABNORMAL HIGH (ref 0–99)

## 2021-09-13 LAB — TSH: TSH: 3.64 u[IU]/mL (ref 0.450–4.500)

## 2021-09-13 LAB — T4, FREE: Free T4: 1.81 ng/dL — ABNORMAL HIGH (ref 0.82–1.77)

## 2021-10-12 ENCOUNTER — Ambulatory Visit: Payer: 59 | Admitting: Family Medicine

## 2021-10-16 ENCOUNTER — Ambulatory Visit (INDEPENDENT_AMBULATORY_CARE_PROVIDER_SITE_OTHER): Payer: 59 | Admitting: Family Medicine

## 2021-10-16 ENCOUNTER — Encounter: Payer: Self-pay | Admitting: Family Medicine

## 2021-10-16 VITALS — BP 138/92 | HR 105 | Temp 98.1°F | Ht 71.0 in | Wt 182.4 lb

## 2021-10-16 DIAGNOSIS — F411 Generalized anxiety disorder: Secondary | ICD-10-CM

## 2021-10-16 DIAGNOSIS — F339 Major depressive disorder, recurrent, unspecified: Secondary | ICD-10-CM | POA: Diagnosis not present

## 2021-10-16 NOTE — Patient Instructions (Signed)
[email or web address] This is [employer], operational.  They mail the Rx to you You do have to sign up with an account and send me the email you used It's $20.40 for a 30 day supply and $55.20 for a 90 day supply.

## 2021-10-16 NOTE — Progress Notes (Signed)
Subjective: CC: Follow-up depression, anxiety PCP: Janora Norlander, DO [id number] Kenneth [name] is a 56 y.o. male presenting to clinic today for:  1.  Depression and anxiety Patient reports that he really is having a positive improvement with the Pristiq 50 mg.  It did negatively impact his weight, he lost appetite for the first few weeks but he since regaining that appetite and is eating almost back to normal.  He has not required his alprazolam almost at all since our last visit.  He only took 1/2 tablet recently for some borderline symptoms.  He also has not really been relying on the Ambien for the last several days and has been able to get to sleep.  He does not report any nausea, vomiting or abdominal pain with the Pristiq.   ROS: Per HPI  No Known Allergies Past Medical History:  Diagnosis Date   Complication of anesthesia    combative waking up after back surgery   DDD (degenerative disc disease), lumbar    Hyperlipidemia    Hypertension    Hypothyroidism     Current Outpatient Medications:    ALPRAZolam (XANAX) 1 MG tablet, Take 0.5-1 tablets (0.5-1 mg total) by mouth 2 (two) times daily as needed for anxiety (place on hold)., Disp: 60 tablet, Rfl: 2   desvenlafaxine (PRISTIQ) 50 MG 24 hr tablet, Take 1 tablet (50 mg total) by mouth daily., Disp: 90 tablet, Rfl: 3   fenofibrate 160 MG tablet, TAKE 1 TABLET DAILY (Patient taking differently: Take 160 mg by mouth daily.), Disp: 30 tablet, Rfl: 3   hydrocortisone (ANUSOL-HC) 2.5 % rectal cream, Place 1 application rectally 2 (two) times daily as needed for hemorrhoids., Disp: 28 g, Rfl: 1   levothyroxine (SYNTHROID) 137 MCG tablet, TAKE ONE TABLET ONCE DAILY BEFORE BREAKFAST, Disp: 90 tablet, Rfl: 0   levothyroxine (SYNTHROID) 150 MCG tablet, Take 1 tablet (150 mcg total) by mouth daily. Saturdays and Sundays only, Disp: 30 tablet, Rfl: 0   losartan-hydrochlorothiazide (HYZAAR) 50-12.5 MG tablet, Take 1 tablet by mouth daily.,  Disp: 90 tablet, Rfl: 3   naproxen (NAPROSYN) 500 MG tablet, Take 1 tablet (500 mg total) by mouth 2 (two) times daily as needed for moderate pain., Disp: 60 tablet, Rfl: 3   rosuvastatin (CRESTOR) 40 MG tablet, Take 1 tablet (40 mg total) by mouth daily., Disp: 90 tablet, Rfl: 3   zolpidem (AMBIEN) 10 MG tablet, Take 0.5-1 tablets (5-10 mg total) by mouth at bedtime as needed for sleep., Disp: 30 tablet, Rfl: 5 Social History   Socioeconomic History   Marital status: Married    Spouse name: Not on file   Number of children: Not on file   Years of education: Not on file   Highest education level: Not on file  Occupational History   Not on file  Tobacco Use   Smoking status: Never   Smokeless tobacco: Never  Vaping Use   Vaping Use: Never used  Substance and Sexual Activity   Alcohol use: No   Drug use: No   Sexual activity: Not on file  Other Topics Concern   Not on file  Social History Narrative   Not on file   Social Determinants of Health   Financial Resource Strain: Not on file  Food Insecurity: Not on file  Transportation Needs: Not on file  Physical Activity: Not on file  Stress: Not on file  Social Connections: Not on file  Intimate Partner Violence: Not on file   Family  History  Problem Relation Age of Onset   Diabetes Mother    Hypertension Mother    Stroke Mother    Hypertrophic cardiomyopathy Mother    Hyperlipidemia Father    Hypertension Father    Heart attack Father    Diabetes Father     Objective: Office vital signs reviewed. BP (!) 136/97    Pulse (!) 105    Temp 98.1 F (36.7 C)    Ht 5\' 11"  (1.803 m)    Wt 182 lb 6.4 oz (82.7 kg)    SpO2 97%    BMI 25.44 kg/m   Physical Examination:  General: Awake, alert, well nourished, No acute distress HEENT: sclera white, MMM Cardio: regular rate and rhythm, S1S2 heard, no murmurs appreciated Psych: Mood is stable.  Patient very pleasant, interactive.  Depression screen [facility] 2/9 01/19/2021 12/08/2020  08/26/2020  Decreased Interest 0 0 0  Down, Depressed, Hopeless 0 0 0  PHQ - 2 Score 0 0 0  Altered sleeping - - 0  Tired, decreased energy - - 0  Change in appetite - - 0  Feeling bad or failure about yourself  - - 0  Trouble concentrating - - 0  Moving slowly or fidgety/restless - - 0  Suicidal thoughts - - 0  PHQ-9 Score - - 0  Difficult doing work/chores - - -  Some recent data might be hidden   GAD 7 : Generalized Anxiety Score 04/20/2020 10/27/2019 07/27/2019 08/22/2018  Nervous, Anxious, on Edge 1 3 1 1   Control/stop worrying 1 3 1 3   Worry too much - different things 1 3 1 3   Trouble relaxing 2 1 0 3  Restless 0 0 0 0  Easily annoyed or irritable 0 0 1 1  Afraid - awful might happen 0 3 0 3  Total GAD 7 Score 5 13 4 14   Anxiety Difficulty - Very difficult Somewhat difficult -   DECLINES PHQ/ GAD Assessment/ Plan: 56 y.o. male   GAD (generalized anxiety disorder)  Depression, recurrent (HCC)  Symptoms are improving on the Pristiq such that he is really not even needed his Xanax nor his Ambien much anymore.  Pretty satisfied with the 50 mg and wish to continue this.  For this reason I will have him see me back in about 2 months, sooner if concerns arise.  He will contact me so that we can send his prescription to the mail order pharmacy through [place] since it is significantly cheaper to get a 90-day supply through them.   No orders of the defined types were placed in this encounter.  No orders of the defined types were placed in this encounter.    Janora Norlander, DO [place] [phone]

## 2021-10-17 MED ORDER — DESVENLAFAXINE SUCCINATE ER 50 MG PO TB24: 50.0000 mg | ORAL_TABLET | Freq: Every day | ORAL | 3 refills | Status: DC

## 2021-10-17 NOTE — Addendum Note (Signed)
Addended by: Janora Norlander on: 10/17/2021 07:42 AM   Modules accepted: Orders

## 2021-11-25 ENCOUNTER — Other Ambulatory Visit: Payer: Self-pay | Admitting: Family Medicine

## 2021-12-05 ENCOUNTER — Other Ambulatory Visit: Payer: Self-pay | Admitting: Family Medicine

## 2021-12-05 DIAGNOSIS — E890 Postprocedural hypothyroidism: Secondary | ICD-10-CM

## 2021-12-05 NOTE — Telephone Encounter (Signed)
Changed directions on scripts per 09/12/21 labwork, 30 day refill given pt has appt on 12/12/21 ?

## 2021-12-12 ENCOUNTER — Encounter: Payer: Self-pay | Admitting: Family Medicine

## 2021-12-12 ENCOUNTER — Ambulatory Visit (INDEPENDENT_AMBULATORY_CARE_PROVIDER_SITE_OTHER): Payer: 59 | Admitting: Family Medicine

## 2021-12-12 ENCOUNTER — Ambulatory Visit: Payer: 59 | Admitting: Family Medicine

## 2021-12-12 VITALS — BP 135/87 | HR 104 | Temp 97.7°F | Ht 71.0 in | Wt 182.6 lb

## 2021-12-12 DIAGNOSIS — Z636 Dependent relative needing care at home: Secondary | ICD-10-CM | POA: Diagnosis not present

## 2021-12-12 DIAGNOSIS — F339 Major depressive disorder, recurrent, unspecified: Secondary | ICD-10-CM

## 2021-12-12 DIAGNOSIS — F19981 Other psychoactive substance use, unspecified with psychoactive substance-induced sexual dysfunction: Secondary | ICD-10-CM | POA: Diagnosis not present

## 2021-12-12 DIAGNOSIS — F411 Generalized anxiety disorder: Secondary | ICD-10-CM | POA: Diagnosis not present

## 2021-12-12 DIAGNOSIS — F5101 Primary insomnia: Secondary | ICD-10-CM

## 2021-12-12 MED ORDER — BUPROPION HCL ER (SR) 100 MG PO TB12
100.0000 mg | ORAL_TABLET | Freq: Every day | ORAL | 0 refills | Status: DC
Start: 2021-12-12 — End: 2022-05-18

## 2021-12-12 MED ORDER — ZOLPIDEM TARTRATE 10 MG PO TABS: 5.0000 mg | ORAL_TABLET | Freq: Every evening | ORAL | 5 refills | Status: DC | PRN

## 2021-12-12 NOTE — Progress Notes (Signed)
? ?Subjective: ?CC: Follow-up anxiety depression ?PCP: Janora Norlander, DO ?HYI:[id number] E Kenneth [name] is a 56 y.o. male presenting to clinic today for: ? ?1.  Follow-up anxiety and depression ?Patient reports that he has been doing extremely well on the Pristiq.  He is extremely happy with how the medication is going and he would not want to stop it for anything.  He notes a great improvement in his symptoms.  He almost never uses the alprazolam despite them having filled it for him recently.  He is only used 3 since his last visit here.  He uses the Ambien intermittently but tries to not take it on a regular basis because he does not want to become desensitized to it.  Not sure if that needs refills or not.  He does admit to some sexual side effects of the Pristiq and he subsequently has a lower libido.  He would be interested in trying to pursue something to combat this as he thinks it may be negatively impacting his wife ? ? ?ROS: Per HPI ? ?No Known Allergies ?Past Medical History:  ?Diagnosis Date  ? Complication of anesthesia   ? combative waking up after back surgery  ? DDD (degenerative disc disease), lumbar   ? Hyperlipidemia   ? Hypertension   ? Hypothyroidism   ? ? ?Current Outpatient Medications:  ?  ALPRAZolam (XANAX) 1 MG tablet, Take 0.5-1 tablets (0.5-1 mg total) by mouth 2 (two) times daily as needed for anxiety (place on hold)., Disp: 60 tablet, Rfl: 2 ?  desvenlafaxine (PRISTIQ) 50 MG 24 hr tablet, Take 1 tablet (50 mg total) by mouth daily., Disp: 90 tablet, Rfl: 3 ?  fenofibrate 160 MG tablet, TAKE 1 TABLET DAILY (Patient taking differently: Take 160 mg by mouth daily.), Disp: 30 tablet, Rfl: 3 ?  hydrocortisone (ANUSOL-HC) 2.5 % rectal cream, Place 1 application rectally 2 (two) times daily as needed for hemorrhoids., Disp: 28 g, Rfl: 1 ?  levothyroxine (SYNTHROID) 137 MCG tablet, Take one tablet on all other days, except 150 mcg tablet on Sundays, Disp: 90 tablet, Rfl: 0 ?  levothyroxine  (SYNTHROID) 150 MCG tablet, Take one tablet as directed only on Sundays, Disp: 30 tablet, Rfl: 0 ?  losartan-hydrochlorothiazide (HYZAAR) 50-12.5 MG tablet, Take 1 tablet by mouth daily., Disp: 90 tablet, Rfl: 3 ?  naproxen (NAPROSYN) 500 MG tablet, TAKE 1 TABLET 2 TIMES A DAY AS NEEDED FOR MODERATE PAIN, Disp: 60 tablet, Rfl: 1 ?  rosuvastatin (CRESTOR) 40 MG tablet, Take 1 tablet (40 mg total) by mouth daily., Disp: 90 tablet, Rfl: 3 ?  zolpidem (AMBIEN) 10 MG tablet, Take 0.5-1 tablets (5-10 mg total) by mouth at bedtime as needed for sleep., Disp: 30 tablet, Rfl: 5 ?Social History  ? ?Socioeconomic History  ? Marital status: Married  ?  Spouse name: Not on file  ? Number of children: Not on file  ? Years of education: Not on file  ? Highest education level: Not on file  ?Occupational History  ? Not on file  ?Tobacco Use  ? Smoking status: Never  ? Smokeless tobacco: Never  ?Vaping Use  ? Vaping Use: Never used  ?Substance and Sexual Activity  ? Alcohol use: No  ? Drug use: No  ? Sexual activity: Not on file  ?Other Topics Concern  ? Not on file  ?Social History Narrative  ? Not on file  ? ?Social Determinants of Health  ? ?Financial Resource Strain: Not on file  ?Food Insecurity:  Not on file  ?Transportation Needs: Not on file  ?Physical Activity: Not on file  ?Stress: Not on file  ?Social Connections: Not on file  ?Intimate Partner Violence: Not on file  ? ?Family History  ?Problem Relation Age of Onset  ? Diabetes Mother   ? Hypertension Mother   ? Stroke Mother   ? Hypertrophic cardiomyopathy Mother   ? Hyperlipidemia Father   ? Hypertension Father   ? Heart attack Father   ? Diabetes Father   ? ? ?Objective: ?Office vital signs reviewed. ?BP 135/87   Pulse (!) 104   Temp 97.7 ?F (36.5 ?C)   Ht '5\' 11"'$  (1.803 m)   Wt 182 lb 9.6 oz (82.8 kg)   SpO2 96%   BMI 25.47 kg/m?  ? ?Physical Examination:  ?General: Awake, alert, well nourished, No acute distress ?Psych: Mood is stable, speech is normal, affect is  appropriate.  Patient is very pleasant, interactive ? ? ?  01/19/2021  ?  9:40 AM 12/08/2020  ?  9:55 AM 08/26/2020  ?  3:04 PM  ?Depression screen PHQ 2/9  ?Decreased Interest 0 0 0  ?Down, Depressed, Hopeless 0 0 0  ?PHQ - 2 Score 0 0 0  ?Altered sleeping   0  ?Tired, decreased energy   0  ?Change in appetite   0  ?Feeling bad or failure about yourself    0  ?Trouble concentrating   0  ?Moving slowly or fidgety/restless   0  ?Suicidal thoughts   0  ?PHQ-9 Score   0  ? ? ?  04/20/2020  ?  4:07 PM 10/27/2019  ?  2:13 PM 07/27/2019  ? 11:27 AM 08/22/2018  ?  8:11 AM  ?GAD 7 : Generalized Anxiety Score  ?Nervous, Anxious, on Edge '1 3 1 1  '$ ?Control/stop worrying '1 3 1 3  '$ ?Worry too much - different things '1 3 1 3  '$ ?Trouble relaxing 2 1 0 3  ?Restless 0 0 0 0  ?Easily annoyed or irritable 0 0 1 1  ?Afraid - awful might happen 0 3 0 3  ?Total GAD 7 Score '5 13 4 14  '$ ?Anxiety Difficulty  Very difficult Somewhat difficult   ? ? ? ? ?Assessment/ Plan: ?56 y.o. male  ? ?GAD (generalized anxiety disorder) ? ?Depression, recurrent ([place]) ? ?Medication-induced sexual dysfunction ([place]) - Plan: buPROPion ER (WELLBUTRIN SR) 100 MG 12 hr tablet ? ?Primary insomnia - Plan: zolpidem (AMBIEN) 10 MG tablet ? ?Caregiver stress ? ?Anxiety disorder and depression controlled from a subjective standpoint.  He refused the GAD-7 score today.  His PHQ remains 0 however.  Continues to use alprazolam sparingly.  No refills needed at this time.  I am going to add Wellbutrin for what sounds like a medication induced sexual dysfunction.  We discussed the possibility that it may negatively impact anxiety and if this was the case I would like him to discontinue.  Starting at low, short acting dose however.   ? ?Sounds like he is having a little bit of caregiver stress but he is managing this.  If needed I am glad to get him connected with a therapist going forward ? ?Ambien has been renewed to be placed on file.  The national narcotic database reviewed and  there were no red flags ? ? ?No orders of the defined types were placed in this encounter. ? ?No orders of the defined types were placed in this encounter. ? ? ? ?Janora Norlander, DO ?  [place] ?[phone] ? ?

## 2021-12-27 ENCOUNTER — Other Ambulatory Visit: Payer: Self-pay | Admitting: Family Medicine

## 2021-12-27 DIAGNOSIS — F411 Generalized anxiety disorder: Secondary | ICD-10-CM

## 2021-12-27 DIAGNOSIS — I10 Essential (primary) hypertension: Secondary | ICD-10-CM

## 2021-12-27 DIAGNOSIS — E782 Mixed hyperlipidemia: Secondary | ICD-10-CM

## 2021-12-29 ENCOUNTER — Encounter: Payer: Self-pay | Admitting: Family Medicine

## 2021-12-29 ENCOUNTER — Ambulatory Visit (INDEPENDENT_AMBULATORY_CARE_PROVIDER_SITE_OTHER): Payer: 59 | Admitting: Family Medicine

## 2021-12-29 VITALS — BP 133/88 | HR 81 | Temp 98.5°F | Ht 71.0 in | Wt 180.8 lb

## 2021-12-29 DIAGNOSIS — M549 Dorsalgia, unspecified: Secondary | ICD-10-CM | POA: Diagnosis not present

## 2021-12-29 DIAGNOSIS — Z9889 Other specified postprocedural states: Secondary | ICD-10-CM | POA: Diagnosis not present

## 2021-12-29 MED ORDER — KETOROLAC TROMETHAMINE 30 MG/ML IJ SOLN
30.0000 mg | Freq: Once | INTRAMUSCULAR | Status: AC
  Administered 2021-12-29: 30 mg via INTRAMUSCULAR

## 2021-12-29 MED ORDER — PREDNISONE 10 MG (21) PO TBPK: ORAL_TABLET | ORAL | 0 refills | Status: DC

## 2021-12-29 MED ORDER — METHYLPREDNISOLONE ACETATE 80 MG/ML IJ SUSP
80.0000 mg | Freq: Once | INTRAMUSCULAR | Status: AC
  Administered 2021-12-29: 80 mg via INTRAMUSCULAR

## 2021-12-29 NOTE — Telephone Encounter (Signed)
I can squeeze him in at the end of the 1045am physical (tell him to be here and I will try and see at 11) ?

## 2021-12-29 NOTE — Progress Notes (Signed)
? ?Subjective: ?CC: Acute back pain ?PCP: Janora Norlander, DO ?LGX:Kenneth [name] is a 56 y.o. male presenting to clinic today for: ? ?1.  Acute on chronic back pain ?Patient has history of lumbar surgery.  He has acute on chronic back pain flareups roughly every 3 to 4 months but these are typically managed with naproxen.  Unfortunately this has not worked this week.  He had onset of recent flareup Sunday or Monday and symptoms really just have not gotten better.  Does not report any preceding or inciting activity.  Denies any urinary retention or fecal incontinence.  Apparently told that the only treatment he had to going forward would be to place rods in his back ? ? ?ROS: Per HPI ? ?No Known Allergies ?Past Medical History:  ?Diagnosis Date  ? Complication of anesthesia   ? combative waking up after back surgery  ? DDD (degenerative disc disease), lumbar   ? Hyperlipidemia   ? Hypertension   ? Hypothyroidism   ? ? ?Current Outpatient Medications:  ?  ALPRAZolam (XANAX) 1 MG tablet, Take 0.5-1 tablets (0.5-1 mg total) by mouth 2 (two) times daily as needed for anxiety (place on hold)., Disp: 60 tablet, Rfl: 2 ?  buPROPion ER (WELLBUTRIN SR) 100 MG 12 hr tablet, Take 1 tablet (100 mg total) by mouth daily., Disp: 90 tablet, Rfl: 0 ?  desvenlafaxine (PRISTIQ) 50 MG 24 hr tablet, Take 1 tablet (50 mg total) by mouth daily., Disp: 90 tablet, Rfl: 3 ?  fenofibrate 160 MG tablet, TAKE 1 TABLET DAILY (Patient taking differently: Take 160 mg by mouth daily.), Disp: 30 tablet, Rfl: 3 ?  hydrocortisone (ANUSOL-HC) 2.5 % rectal cream, Place 1 application rectally 2 (two) times daily as needed for hemorrhoids., Disp: 28 g, Rfl: 1 ?  levothyroxine (SYNTHROID) 137 MCG tablet, Take one tablet on all other days, except 150 mcg tablet on Sundays, Disp: 90 tablet, Rfl: 0 ?  levothyroxine (SYNTHROID) 150 MCG tablet, Take one tablet as directed only on Sundays, Disp: 30 tablet, Rfl: 0 ?  losartan-hydrochlorothiazide (HYZAAR)  50-12.5 MG tablet, Take 1 tablet by mouth daily., Disp: 90 tablet, Rfl: 3 ?  naproxen (NAPROSYN) 500 MG tablet, TAKE 1 TABLET 2 TIMES A DAY AS NEEDED FOR MODERATE PAIN, Disp: 60 tablet, Rfl: 1 ?  predniSONE (STERAPRED UNI-PAK 21 TAB) 10 MG (21) TBPK tablet, As directed x 6 days. Start 12/30/2021, Disp: 21 tablet, Rfl: 0 ?  rosuvastatin (CRESTOR) 40 MG tablet, Take 1 tablet (40 mg total) by mouth daily., Disp: 90 tablet, Rfl: 3 ?  zolpidem (AMBIEN) 10 MG tablet, Take 0.5-1 tablets (5-10 mg total) by mouth at bedtime as needed for sleep (put on file)., Disp: 30 tablet, Rfl: 5 ? ?Current Facility-Administered Medications:  ?  ketorolac (TORADOL) 30 MG/ML injection 30 mg, 30 mg, Intramuscular, Once, Lucila Klecka M, DO ?  methylPREDNISolone acetate (DEPO-MEDROL) injection 80 mg, 80 mg, Intramuscular, Once, Ronnie Doss M, DO ?Social History  ? ?Socioeconomic History  ? Marital status: Married  ?  Spouse name: Not on file  ? Number of children: Not on file  ? Years of education: Not on file  ? Highest education level: Not on file  ?Occupational History  ? Not on file  ?Tobacco Use  ? Smoking status: Never  ? Smokeless tobacco: Never  ?Vaping Use  ? Vaping Use: Never used  ?Substance and Sexual Activity  ? Alcohol use: No  ? Drug use: No  ? Sexual activity: Not  on file  ?Other Topics Concern  ? Not on file  ?Social History Narrative  ? Not on file  ? ?Social Determinants of Health  ? ?Financial Resource Strain: Not on file  ?Food Insecurity: Not on file  ?Transportation Needs: Not on file  ?Physical Activity: Not on file  ?Stress: Not on file  ?Social Connections: Not on file  ?Intimate Partner Violence: Not on file  ? ?Family History  ?Problem Relation Age of Onset  ? Diabetes Mother   ? Hypertension Mother   ? Stroke Mother   ? Hypertrophic cardiomyopathy Mother   ? Hyperlipidemia Father   ? Hypertension Father   ? Heart attack Father   ? Diabetes Father   ? ? ?Objective: ?Office vital signs reviewed. ?BP 133/88    Pulse 81   Temp 98.5 ?F (36.9 ?C)   Ht '5\' 11"'$  (1.803 m)   Wt 180 lb 12.8 oz (82 kg)   SpO2 97%   BMI 25.22 kg/m?  ? ?Physical Examination:  ?General: Awake, alert, looks very uncomfortable.  He is leaning to the left side in efforts to offset pressure ?MSK: Gait is antalgic but independent ? ?Assessment/ Plan: ?56 y.o. male  ? ?Back pain with history of spinal surgery - Plan: ketorolac (TORADOL) 30 MG/ML injection 30 mg, methylPREDNISolone acetate (DEPO-MEDROL) injection 80 mg, predniSONE (STERAPRED UNI-PAK 21 TAB) 10 MG (21) TBPK tablet ? ?Unfortunately having exacerbations of his back pain roughly every 3 to 4 months.  Since this was not rectified by OTC and conservative care, we will give him the cocktail that normally helps with the back pain.  Depo Medrol and Toradol administered today.  We discussed GI risks with these medications.  He will start a prednisone Dosepak starting tomorrow.  Advised him to use some type of PPI or Pepcid to protect his stomach.  He will follow-up as needed ? ?No orders of the defined types were placed in this encounter. ? ?Meds ordered this encounter  ?Medications  ? ketorolac (TORADOL) 30 MG/ML injection 30 mg  ? methylPREDNISolone acetate (DEPO-MEDROL) injection 80 mg  ? predniSONE (STERAPRED UNI-PAK 21 TAB) 10 MG (21) TBPK tablet  ?  Sig: As directed x 6 days. Start 12/30/2021  ?  Dispense:  21 tablet  ?  Refill:  0  ? ? ? ?Janora Norlander, DO ?[place] ?[phone] ? ?

## 2022-01-02 ENCOUNTER — Ambulatory Visit: Payer: 59 | Admitting: Family Medicine

## 2022-01-31 ENCOUNTER — Other Ambulatory Visit: Payer: Self-pay | Admitting: Family Medicine

## 2022-03-28 ENCOUNTER — Other Ambulatory Visit: Payer: Self-pay | Admitting: Family Medicine

## 2022-03-28 DIAGNOSIS — E890 Postprocedural hypothyroidism: Secondary | ICD-10-CM

## 2022-05-10 ENCOUNTER — Encounter: Payer: Self-pay | Admitting: Family Medicine

## 2022-05-11 NOTE — Telephone Encounter (Signed)
There is a 1015 next Friday. Please put him in that.  Will need UDS/ CSC updated that visit.  He needs to hold his urine before that appt.

## 2022-05-18 ENCOUNTER — Ambulatory Visit (INDEPENDENT_AMBULATORY_CARE_PROVIDER_SITE_OTHER): Payer: 59 | Admitting: Family Medicine

## 2022-05-18 ENCOUNTER — Encounter: Payer: Self-pay | Admitting: Family Medicine

## 2022-05-18 VITALS — BP 127/88 | HR 98 | Temp 98.7°F | Ht 71.0 in | Wt 179.2 lb

## 2022-05-18 DIAGNOSIS — Z79899 Other long term (current) drug therapy: Secondary | ICD-10-CM | POA: Diagnosis not present

## 2022-05-18 DIAGNOSIS — E890 Postprocedural hypothyroidism: Secondary | ICD-10-CM | POA: Diagnosis not present

## 2022-05-18 DIAGNOSIS — F411 Generalized anxiety disorder: Secondary | ICD-10-CM

## 2022-05-18 DIAGNOSIS — Z125 Encounter for screening for malignant neoplasm of prostate: Secondary | ICD-10-CM

## 2022-05-18 DIAGNOSIS — F5101 Primary insomnia: Secondary | ICD-10-CM | POA: Diagnosis not present

## 2022-05-18 DIAGNOSIS — R5383 Other fatigue: Secondary | ICD-10-CM

## 2022-05-18 MED ORDER — ALPRAZOLAM 1 MG PO TABS: 0.5000 mg | ORAL_TABLET | Freq: Two times a day (BID) | ORAL | 2 refills | Status: DC | PRN

## 2022-05-18 MED ORDER — ZOLPIDEM TARTRATE 10 MG PO TABS: 5.0000 mg | ORAL_TABLET | Freq: Every evening | ORAL | 5 refills | Status: DC | PRN

## 2022-05-18 MED ORDER — NAPROXEN 500 MG PO TABS: ORAL_TABLET | ORAL | 6 refills | Status: DC

## 2022-05-18 NOTE — Progress Notes (Signed)
Subjective: Kenneth [name], GAD, thyroid disorder PCP: Raliegh Ip, DO WLY:HNCMASO E Sayegh is a 56 y.o. male presenting to clinic today for:  1.  Anxiety disorder/insomnia Patient reports he had a spike in his anxiety and panic over the last several weeks.  He has a lot going on between his mother, son.  He subsequently started going back on half a tablet of the Xanax daily.  He really does not want to be dependent on this medicine and wonders if perhaps we can advance his Pristiq to 100 mg.  He continues to use Ambien at bedtime if needed for sleep.  Denies any excessive daytime sedation, falls, respiratory pression, visual or auditory hallucinations.  2.  Hypothyroidism Patient is compliant with Synthroid 150 mcg on Saturdays and Sundays and 137 mcg daily all other days.  Reports anxiety as above.  Has had some fatigability and would like to have his testosterone, PSA checked.   ROS: Per HPI  No Known Allergies Past Medical History:  Diagnosis Date   Complication of anesthesia    combative waking up after back surgery   DDD (degenerative disc disease), lumbar    Hyperlipidemia    Hypertension    Hypothyroidism     Current Outpatient Medications:    ALPRAZolam (XANAX) 1 MG tablet, Take 0.5-1 tablets (0.5-1 mg total) by mouth 2 (two) times daily as needed for anxiety (place on hold)., Disp: 60 tablet, Rfl: 2   buPROPion ER (WELLBUTRIN SR) 100 MG 12 hr tablet, Take 1 tablet (100 mg total) by mouth daily., Disp: 90 tablet, Rfl: 0   desvenlafaxine (PRISTIQ) 50 MG 24 hr tablet, Take 1 tablet (50 mg total) by mouth daily., Disp: 90 tablet, Rfl: 3   fenofibrate 160 MG tablet, TAKE 1 TABLET DAILY, Disp: 90 tablet, Rfl: 3   hydrocortisone (ANUSOL-HC) 2.5 % rectal cream, Place 1 application rectally 2 (two) times daily as needed for hemorrhoids., Disp: 28 g, Rfl: 1   levothyroxine (SYNTHROID) 137 MCG tablet, TAKE ONE TABLET ONCE DAILY BEFORE BREAKFAST, Disp: 90 tablet, Rfl: 0    levothyroxine (SYNTHROID) 150 MCG tablet, TAKE ONE TABLET AS DIRECTED ON SATURDAYS AND SUNDAYS, Disp: 30 tablet, Rfl: 0   losartan-hydrochlorothiazide (HYZAAR) 50-12.5 MG tablet, Take 1 tablet by mouth daily., Disp: 90 tablet, Rfl: 3   naproxen (NAPROSYN) 500 MG tablet, TAKE 1 TABLET 2 TIMES A DAY AS NEEDED FOR MODERATE PAIN, Disp: 60 tablet, Rfl: 1   rosuvastatin (CRESTOR) 40 MG tablet, Take 1 tablet (40 mg total) by mouth daily., Disp: 90 tablet, Rfl: 3   zolpidem (AMBIEN) 10 MG tablet, Take 0.5-1 tablets (5-10 mg total) by mouth at bedtime as needed for sleep (put on file)., Disp: 30 tablet, Rfl: 5 Social History   Socioeconomic History   Marital status: Married    Spouse name: Not on file   Number of children: Not on file   Years of education: Not on file   Highest education level: Not on file  Occupational History   Not on file  Tobacco Use   Smoking status: Never   Smokeless tobacco: Never  Vaping Use   Vaping Use: Never used  Substance and Sexual Activity   Alcohol use: No   Drug use: No   Sexual activity: Not on file  Other Topics Concern   Not on file  Social History Narrative   Not on file   Social Determinants of Health   Financial Resource Strain: Not on file  Food Insecurity: Not on  file  Transportation Needs: Not on file  Physical Activity: Not on file  Stress: Not on file  Social Connections: Not on file  Intimate Partner Violence: Not on file   Family History  Problem Relation Age of Onset   Diabetes Mother    Hypertension Mother    Stroke Mother    Hypertrophic cardiomyopathy Mother    Hyperlipidemia Father    Hypertension Father    Heart attack Father    Diabetes Father     Objective: Office vital signs reviewed. BP 127/88   Pulse 98   Temp 98.7 F (37.1 C) (Temporal)   Ht 5\' 11"  (1.803 m)   Wt 179 lb 3.2 oz (81.3 kg)   SpO2 97%   BMI 24.99 kg/m   Physical Examination:  General: Awake, alert, well nourished, No acute distress HEENT:  No exophthalmos.  Moist mucous membranes Cardio: regular rate and rhythm, S1S2 heard, no murmurs appreciated Pulm: clear to auscultation bilaterally, no wheezes, rhonchi or rales; normal work of breathing on room air MSK: Normal gait and station Neuro: Very mild hand tremor Psych: Anxious but very pleasant and interactive     05/18/2022   10:04 AM 01/19/2021    9:40 AM 12/08/2020    9:55 AM  Depression screen PHQ 2/9  Decreased Interest 1 0 0  Down, Depressed, Hopeless 2 0 0  PHQ - 2 Score 3 0 0  Altered sleeping 2    Tired, decreased energy 2    Change in appetite 1    Feeling bad or failure about yourself  2    Trouble concentrating 1    Moving slowly or fidgety/restless 1    Suicidal thoughts 0    PHQ-9 Score 12    Difficult doing work/chores Somewhat difficult        05/18/2022   10:05 AM 04/20/2020    4:07 PM 10/27/2019    2:13 PM 07/27/2019   11:27 AM  GAD 7 : Generalized Anxiety Score  Nervous, Anxious, on Edge 3 1 3 1   Control/stop worrying 2 1 3 1   Worry too much - different things 2 1 3 1   Trouble relaxing 2 2 1  0  Restless 0 0 0 0  Easily annoyed or irritable 0 0 0 1  Afraid - awful might happen 2 0 3 0  Total GAD 7 Score 11 5 13 4   Anxiety Difficulty Somewhat difficult  Very difficult Somewhat difficult    Assessment/ Plan: 56 y.o. male   GAD (generalized anxiety disorder) - Plan: ToxASSURE Select 13 (MW), Urine, ALPRAZolam (XANAX) 1 MG tablet, Drug Screen 10 W/Conf, Se  Chronic use of benzodiazepine for therapeutic purpose - Plan: ToxASSURE Select 13 (MW), Urine, Drug Screen 10 W/Conf, Se  Controlled substance agreement signed - Plan: ToxASSURE Select 13 (MW), Urine, Drug Screen 10 W/Conf, Se  Primary insomnia - Plan: zolpidem (AMBIEN) 10 MG tablet  Postoperative hypothyroidism - Plan: TSH, T4, Free  Screening for malignant neoplasm of prostate - Plan: PSA  Low energy - Plan: Testosterone, [id number]  Certainly think there is a situational  exacerbation of his symptoms related to family stressors.  Check UDS.  CSC updated today.  National narcotic database reviewed and there were no red flags.  Alprazolam renewed.  Anticipate need for advance Pristiq dose pending thyroid lab results.  Ambien also renewed.  Possibly thyroid mediated symptoms.  Check thyroid levels.  PSA and testosterone levels also collected per patient request today.  No orders of the defined  types were placed in this encounter.  No orders of the defined types were placed in this encounter.    Janora Norlander, DO [place] [phone]

## 2022-05-18 NOTE — Addendum Note (Signed)
Addended by: Janora Norlander on: 05/18/2022 10:23 AM   Modules accepted: Orders

## 2022-05-23 LAB — CMP14+EGFR
ALT: 25 IU/L (ref 0–44)
AST: 23 IU/L (ref 0–40)
Albumin/Globulin Ratio: 1.9 (ref 1.2–2.2)
Albumin: 4.7 g/dL (ref 3.8–4.9)
Alkaline Phosphatase: 62 IU/L (ref 44–121)
BUN/Creatinine Ratio: 13 (ref 9–20)
BUN: 14 mg/dL (ref 6–24)
Bilirubin Total: 0.5 mg/dL (ref 0.0–1.2)
CO2: 22 mmol/L (ref 20–29)
Calcium: 9.9 mg/dL (ref 8.7–10.2)
Chloride: 100 mmol/L (ref 96–106)
Creatinine, Ser: 1.09 mg/dL (ref 0.76–1.27)
Globulin, Total: 2.5 g/dL (ref 1.5–4.5)
Glucose: 102 mg/dL — ABNORMAL HIGH (ref 70–99)
Potassium: 4.1 mmol/L (ref 3.5–5.2)
Sodium: 140 mmol/L (ref 134–144)
Total Protein: 7.2 g/dL (ref 6.0–8.5)
eGFR: 80 mL/min/{1.73_m2} (ref >59)

## 2022-05-23 LAB — DRUG SCREEN 10 W/CONF, SERUM
Amphetamines, IA: NEGATIVE ng/mL
Barbiturates, IA: NEGATIVE ug/mL
Benzodiazepines, IA: NEGATIVE ng/mL
Cocaine & Metabolite, IA: NEGATIVE ng/mL
Methadone, IA: NEGATIVE ng/mL
Opiates, IA: NEGATIVE ng/mL
Oxycodones, IA: NEGATIVE ng/mL
Phencyclidine, IA: NEGATIVE ng/mL
Propoxyphene, IA: NEGATIVE ng/mL
THC(Marijuana) Metabolite, IA: NEGATIVE ng/mL

## 2022-05-23 LAB — PSA: Prostate Specific Ag, Serum: 0.7 ng/mL (ref 0.0–4.0)

## 2022-05-23 LAB — TESTOSTERONE: Testosterone: 381 ng/dL (ref 264–916)

## 2022-05-23 LAB — T4, FREE: Free T4: 1.64 ng/dL (ref 0.82–1.77)

## 2022-05-23 LAB — TSH: TSH: 2.72 u[IU]/mL (ref 0.450–4.500)

## 2022-06-19 ENCOUNTER — Other Ambulatory Visit: Payer: Self-pay | Admitting: Family Medicine

## 2022-06-19 DIAGNOSIS — E890 Postprocedural hypothyroidism: Secondary | ICD-10-CM

## 2022-07-17 ENCOUNTER — Ambulatory Visit (INDEPENDENT_AMBULATORY_CARE_PROVIDER_SITE_OTHER): Payer: 59 | Admitting: Family Medicine

## 2022-07-17 ENCOUNTER — Encounter: Payer: Self-pay | Admitting: Family Medicine

## 2022-07-17 VITALS — BP 137/83 | HR 86 | Temp 98.1°F | Ht 71.0 in | Wt 183.6 lb

## 2022-07-17 DIAGNOSIS — Z23 Encounter for immunization: Secondary | ICD-10-CM | POA: Diagnosis not present

## 2022-07-17 DIAGNOSIS — M5441 Lumbago with sciatica, right side: Secondary | ICD-10-CM | POA: Diagnosis not present

## 2022-07-17 DIAGNOSIS — R69 Illness, unspecified: Secondary | ICD-10-CM | POA: Diagnosis not present

## 2022-07-17 DIAGNOSIS — F411 Generalized anxiety disorder: Secondary | ICD-10-CM | POA: Diagnosis not present

## 2022-07-17 MED ORDER — PREDNISONE 10 MG (21) PO TBPK: ORAL_TABLET | ORAL | 1 refills | Status: DC

## 2022-07-17 NOTE — Progress Notes (Signed)
I have separately seen and examined the patient. I have discussed the findings and exam with student Dr Jerline Pain and agree with the below note.  My changes/additions are outlined in BLUE.    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  This is his typical back pain.  He denies any fecal incontinence, urinary retention or saddle anesthesia.  Pain is slightly better with some over-the-counter Tylenol back and body but is still present.  It is not interfering with his ability to work but he is somewhat uncomfortable and would like to repeat steroids if possible.  O: Vitals:   07/17/22 0955  BP: 137/83  Pulse: 86  Temp: 98.1 F (36.7 C)  SpO2: 97%    BP 137/83   Pulse 86   Temp 98.1 F (36.7 C)   Ht '5\' 11"'$  (1.803 m)   Wt 183 lb 9.6 oz (83.3 kg)   SpO2 97%   BMI 25.61 kg/m  General appearance: alert, cooperative, appears stated age, and no distress Extremities: extremities normal, atraumatic, no cyanosis or edema MSK: He is ambulating independently with antalgic gait.  Tenderness palpation over roughly the right L5 on S1 area.  There are no palpable abnormalities.  He has no midline tenderness palpation to the lumbar spine.     05/18/2022   10:05 AM 04/20/2020    4:07 PM 10/27/2019    2:13 PM  GAD 7 : Generalized Anxiety Score  Nervous, Anxious, on Edge '3 1 3  '$ Control/stop worrying '2 1 3  '$ Worry too much - different things '2 1 3  '$ Trouble relaxing '2 2 1  '$ Restless 0 0 0  Easily annoyed or irritable 0 0 0  Afraid - awful might happen 2 0 3  Total GAD 7 Score '11 5 13  '$ Anxiety Difficulty Somewhat difficult  Very difficult      A/P:  Acute right-sided low back pain with right-sided sciatica - Plan: predniSONE (STERAPRED UNI-PAK 21 TAB) 10 MG (21) TBPK tablet  Generalized anxiety disorder  Need for immunization against influenza - Plan: Flu Vaccine  QUAD 28moIM (Fluarix, Fluzone & Alfiuria Quad PF)  Prednisone Dosepak prescribed.  Physical exam noted for tenderness palpation in the right lower back in the lumbar region.  He demonstrates no red flag signs or symptoms but we reviewed what red flag signs and symptoms would be and reasons for emergent evaluation.  I have added 1 additional refill on the prednisone Dosepak since this is occurring roughly every 6 months.  We discussed that if this starts becoming more frequent he should see his back specialist again.  He was amenable to this plan and will follow-up as directed  Anxiety disorder waxes and wanes and he has chronic benzodiazepine as needed and sleep aid if needed.  He will follow-up in the next 3 to 4 months for interval checkup, fasting labs etc.  Influenza vaccination administered today  Johnay Mano M. GL[facility]   -------------------------------------------------------------------------------------------------------------------------------------------------------------------------------------     Subjective: CC: Low back pain PCP: GJanora Norlander DO HOXB:[id number] MRiggiis a 56y.o. male presenting to clinic today for:  Hypertension  Hyperlipidemia  The patient remains adherent to his medications, which include losartan-hydrochlorothiazide, fenofibrate, and rosuvastatin. He does not report lower extremity edema, chest pain, or shortness of breath. He reports no muscle pain or  gastrointestinal disturbances.   Generalized anxiety disorder/insomnia  Patient reports that he continues to take his anxiety medications, including alprazolam, desvenlafaxine, and zolpidem. Stressors include caring for the patient's son who has autism spectrum disorder, running his own business, and caring for his elderly parents.  He reports that he takes one half to one full tablet of Xanax daily, depending on his circumstances. His anxiety has improved since  life circumstances have changed with regards to his son's improved behavior. He acknowledges that his anxiety is worse during periods of life that are inherently stressful.   Hypothyroidism Patient is compliant with taking his Synthroid medication. Patient denies easy fatigability, weight loss, dry or brittle skin, cold sensitivity. Also denies rapid heart beat, unintentional weight loss, excessive sweating, and heat intolerance.   ROS: Per HPI  No Known Allergies Past Medical History:  Diagnosis Date   Complication of anesthesia    combative waking up after back surgery   DDD (degenerative disc disease), lumbar    Hyperlipidemia    Hypertension    Hypothyroidism     Current Outpatient Medications:    ALPRAZolam (XANAX) 1 MG tablet, Take 0.5-1 tablets (0.5-1 mg total) by mouth 2 (two) times daily as needed for anxiety (place on hold)., Disp: 60 tablet, Rfl: 2   desvenlafaxine (PRISTIQ) 50 MG 24 hr tablet, Take 1 tablet (50 mg total) by mouth daily., Disp: 90 tablet, Rfl: 3   fenofibrate 160 MG tablet, TAKE 1 TABLET DAILY, Disp: 90 tablet, Rfl: 3   hydrocortisone (ANUSOL-HC) 2.5 % rectal cream, Place 1 application rectally 2 (two) times daily as needed for hemorrhoids., Disp: 28 g, Rfl: 1   levothyroxine (SYNTHROID) 137 MCG tablet, TAKE ONE TABLET ONCE DAILY BEFORE BREAKFAST, Disp: 90 tablet, Rfl: 3   levothyroxine (SYNTHROID) 150 MCG tablet, TAKE ONE TABLET AS DIRECTED ON SATURDAYS AND SUNDAYS, Disp: 30 tablet, Rfl: 3   losartan-hydrochlorothiazide (HYZAAR) 50-12.5 MG tablet, Take 1 tablet by mouth daily., Disp: 90 tablet, Rfl: 3   naproxen (NAPROSYN) 500 MG tablet, TAKE 1 TABLET 2 TIMES A DAY AS NEEDED FOR MODERATE PAIN, Disp: 60 tablet, Rfl: 6   predniSONE (STERAPRED UNI-PAK 21 TAB) 10 MG (21) TBPK tablet, As directed x 6 days for sciatica flareup, Disp: 21 tablet, Rfl: 1   rosuvastatin (CRESTOR) 40 MG tablet, Take 1 tablet (40 mg total) by mouth daily., Disp: 90 tablet, Rfl: 3    zolpidem (AMBIEN) 10 MG tablet, Take 0.5-1 tablets (5-10 mg total) by mouth at bedtime as needed for sleep (put on file)., Disp: 30 tablet, Rfl: 5 Social History   Socioeconomic History   Marital status: Married    Spouse name: Not on file   Number of children: Not on file   Years of education: Not on file   Highest education level: Not on file  Occupational History   Not on file  Tobacco Use   Smoking status: Never   Smokeless tobacco: Never  Vaping Use   Vaping Use: Never used  Substance and Sexual Activity   Alcohol use: No   Drug use: No   Sexual activity: Not on file  Other Topics Concern   Not on file  Social History Narrative   Not on file   Social Determinants of Health   Financial Resource Strain: Not on file  Food Insecurity: Not on file  Transportation Needs: Not on file  Physical Activity: Not on file  Stress: Not on file  Social Connections: Not on file  Intimate Partner Violence:  Not on file   Family History  Problem Relation Age of Onset   Diabetes Mother    Hypertension Mother    Stroke Mother    Hypertrophic cardiomyopathy Mother    Hyperlipidemia Father    Hypertension Father    Heart attack Father    Diabetes Father     Objective: Office vital signs reviewed. BP 137/83   Pulse 86   Temp 98.1 F (36.7 C)   Ht '5\' 11"'$  (1.803 m)   Wt 183 lb 9.6 oz (83.3 kg)   SpO2 97%   BMI 25.61 kg/m   Physical Examination:  General: Awake, alert, well nourished, No acute distress Cardio: regular rate and rhythm, S1S2 heard, no murmurs appreciated Pulm: clear to auscultation bilaterally, no wheezes, rhonchi or rales; normal work of breathing on room air GI: soft, non-tender, non-distended, bowel sounds present x4, no hepatomegaly, no splenomegaly, no masses Extremities: warm, well perfused, No edema, cyanosis or clubbing; +1 pulses bilaterally Skin: dry; intact; no rashes or lesions MSK: Positive straight leg raise test. Low back pain localized to L5  dermatome.   Assessment/ Plan: 56 y.o. male   The patient is experiencing low back pain that is likely an exacerbation of their ongoing sciatica. The pain follows the typical pattern of the sciatic nerve, radiating down the back of the thigh, calf, and into the dorsum of the foot. Prednisone Pak has been prescribed, and a refill has been provided. We also discussed the possibility of a referral to a specialist in back-related issues if the condition worsens. The patient has been counseled on recognizing red flag symptoms, including saddle anesthesia, which could indicate cauda equina syndrome and is a medical emergency.   In terms of anxiety, the patient's condition remains stable and there has been an improvement in their symptoms. The patient reports that anxiety is worse in the morning and experiences some social anxiety, for which they take 1/2 to 1 xanax per day. No changes to medications needed at this time.   Flu vaccine provided   Orders Placed This Encounter  Procedures   Flu Vaccine QUAD 29moIM (Fluarix, Fluzone & Alfiuria Quad PF)   Meds ordered this encounter  Medications   predniSONE (STERAPRED UNI-PAK 21 TAB) 10 MG (21) TBPK tablet    Sig: As directed x 6 days for sciatica flareup    Dispense:  21 tablet    Refill:  1[place] MS3

## 2022-07-21 ENCOUNTER — Other Ambulatory Visit: Payer: Self-pay | Admitting: Family Medicine

## 2022-07-21 DIAGNOSIS — I10 Essential (primary) hypertension: Secondary | ICD-10-CM

## 2022-07-21 DIAGNOSIS — E782 Mixed hyperlipidemia: Secondary | ICD-10-CM

## 2022-09-08 ENCOUNTER — Other Ambulatory Visit: Payer: Self-pay | Admitting: Family Medicine

## 2022-09-08 DIAGNOSIS — F411 Generalized anxiety disorder: Secondary | ICD-10-CM

## 2022-10-22 ENCOUNTER — Other Ambulatory Visit: Payer: Self-pay | Admitting: Family Medicine

## 2022-10-22 DIAGNOSIS — I10 Essential (primary) hypertension: Secondary | ICD-10-CM

## 2022-10-22 DIAGNOSIS — E782 Mixed hyperlipidemia: Secondary | ICD-10-CM

## 2022-10-31 ENCOUNTER — Encounter: Payer: Self-pay | Admitting: Family Medicine

## 2022-10-31 ENCOUNTER — Ambulatory Visit: Payer: 59 | Admitting: Family Medicine

## 2022-10-31 ENCOUNTER — Other Ambulatory Visit: Payer: Self-pay

## 2022-10-31 VITALS — BP 130/83 | HR 88 | Temp 98.4°F | Ht 71.0 in | Wt 183.0 lb

## 2022-10-31 DIAGNOSIS — F411 Generalized anxiety disorder: Secondary | ICD-10-CM

## 2022-10-31 DIAGNOSIS — F339 Major depressive disorder, recurrent, unspecified: Secondary | ICD-10-CM

## 2022-10-31 DIAGNOSIS — F5101 Primary insomnia: Secondary | ICD-10-CM

## 2022-10-31 DIAGNOSIS — I10 Essential (primary) hypertension: Secondary | ICD-10-CM | POA: Diagnosis not present

## 2022-10-31 DIAGNOSIS — E890 Postprocedural hypothyroidism: Secondary | ICD-10-CM

## 2022-10-31 DIAGNOSIS — E782 Mixed hyperlipidemia: Secondary | ICD-10-CM

## 2022-10-31 DIAGNOSIS — R69 Illness, unspecified: Secondary | ICD-10-CM | POA: Diagnosis not present

## 2022-10-31 MED ORDER — ZOLPIDEM TARTRATE 10 MG PO TABS: 5.0000 mg | ORAL_TABLET | Freq: Every evening | ORAL | 5 refills | Status: DC | PRN

## 2022-10-31 MED ORDER — DESVENLAFAXINE SUCCINATE ER 50 MG PO TB24: 50.0000 mg | ORAL_TABLET | Freq: Every day | ORAL | 3 refills | Status: DC

## 2022-10-31 MED ORDER — ALPRAZOLAM 1 MG PO TABS: 0.5000 mg | ORAL_TABLET | Freq: Two times a day (BID) | ORAL | 2 refills | Status: DC | PRN

## 2022-10-31 MED ORDER — LOSARTAN POTASSIUM-HCTZ 50-12.5 MG PO TABS: 1.0000 | ORAL_TABLET | Freq: Every day | ORAL | 3 refills | Status: DC

## 2022-10-31 MED ORDER — LEVOTHYROXINE SODIUM 137 MCG PO TABS: ORAL_TABLET | ORAL | 3 refills | Status: DC

## 2022-10-31 MED ORDER — ROSUVASTATIN CALCIUM 40 MG PO TABS: 40.0000 mg | ORAL_TABLET | Freq: Every day | ORAL | 3 refills | Status: DC

## 2022-10-31 MED ORDER — LEVOTHYROXINE SODIUM 150 MCG PO TABS: ORAL_TABLET | ORAL | 3 refills | Status: DC

## 2022-10-31 NOTE — Progress Notes (Signed)
Subjective: CC: Follow-up anxiety disorder PCP: Janora Norlander, DO [id number] Kenneth [name] is a 57 y.o. male presenting to clinic today for:  1.  Generalized anxiety disorder with panic attacks and insomnia Patient is compliant with Pristiq 50 mg daily.  He takes roughly 1/2 tablet of Xanax daily this is typically early in the morning and this allows him to function throughout the day.  He does not use Ambien more regularly and typically now at 10 mg.  He identifies that he needs to skip some days that he can desensitize himself again.  He denies any excessive daytime sedation, falls, respiratory pression, visual or auditory hallucinations.  No memory changes.  No sleepwalking  2.  Hypothyroidism Patient is compliant with Synthroid 137 mcg daily except for 150 mcg on Saturdays and Sundays.  He reports anxiety as above.  No tremor, heart palpitations.  No changes in bowel habits or weight reported  3.  Hypertension associate with hyperlipidemia.   Compliant with medications.  Needs refills.  No chest pain or shortness of breath reported.  Nonfasting today   ROS: Per HPI  No Known Allergies Past Medical History:  Diagnosis Date   Complication of anesthesia    combative waking up after back surgery   DDD (degenerative disc disease), lumbar    Hyperlipidemia    Hypertension    Hypothyroidism     Current Outpatient Medications:    ALPRAZolam (XANAX) 1 MG tablet, Take 0.5-1 tablets (0.5-1 mg total) by mouth 2 (two) times daily as needed for anxiety (place on hold)., Disp: 60 tablet, Rfl: 2   desvenlafaxine (PRISTIQ) 50 MG 24 hr tablet, Take 1 tablet (50 mg total) by mouth daily., Disp: 90 tablet, Rfl: 3   fenofibrate 160 MG tablet, TAKE 1 TABLET DAILY, Disp: 90 tablet, Rfl: 3   hydrocortisone (ANUSOL-HC) 2.5 % rectal cream, Place 1 application rectally 2 (two) times daily as needed for hemorrhoids., Disp: 28 g, Rfl: 1   levothyroxine (SYNTHROID) 137 MCG tablet, TAKE ONE TABLET ONCE  DAILY BEFORE BREAKFAST, Disp: 90 tablet, Rfl: 3   levothyroxine (SYNTHROID) 150 MCG tablet, TAKE ONE TABLET AS DIRECTED ON SATURDAYS AND SUNDAYS, Disp: 30 tablet, Rfl: 3   losartan-hydrochlorothiazide (HYZAAR) 50-12.5 MG tablet, TAKE ONE TABLET DAILY, Disp: 90 tablet, Rfl: 0   naproxen (NAPROSYN) 500 MG tablet, TAKE 1 TABLET 2 TIMES A DAY AS NEEDED FOR MODERATE PAIN, Disp: 60 tablet, Rfl: 6   rosuvastatin (CRESTOR) 40 MG tablet, TAKE ONE TABLET DAILY, Disp: 90 tablet, Rfl: 0   zolpidem (AMBIEN) 10 MG tablet, Take 0.5-1 tablets (5-10 mg total) by mouth at bedtime as needed for sleep (put on file)., Disp: 30 tablet, Rfl: 5 Social History   Socioeconomic History   Marital status: Married    Spouse name: Not on file   Number of children: Not on file   Years of education: Not on file   Highest education level: Not on file  Occupational History   Not on file  Tobacco Use   Smoking status: Never   Smokeless tobacco: Never  Vaping Use   Vaping Use: Never used  Substance and Sexual Activity   Alcohol use: No   Drug use: No   Sexual activity: Not on file  Other Topics Concern   Not on file  Social History Narrative   Not on file   Social Determinants of Health   Financial Resource Strain: Not on file  Food Insecurity: Not on file  Transportation Needs: Not on  file  Physical Activity: Not on file  Stress: Not on file  Social Connections: Not on file  Intimate Partner Violence: Not on file   Family History  Problem Relation Age of Onset   Diabetes Mother    Hypertension Mother    Stroke Mother    Hypertrophic cardiomyopathy Mother    Hyperlipidemia Father    Hypertension Father    Heart attack Father    Diabetes Father     Objective: Office vital signs reviewed. BP 130/83   Pulse 88   Temp 98.4 F (36.9 C)   Ht 5' 11"$  (1.803 m)   Wt 183 lb (83 kg)   SpO2 96%   BMI 25.52 kg/m   Physical Examination:  General: Awake, alert, well nourished, No acute distress HEENT:  No exophthalmos.  No goiter Cardio: regular rate and rhythm, S1S2 heard, no murmurs appreciated Pulm: clear to auscultation bilaterally, no wheezes, rhonchi or rales; normal work of breathing on room air Neuro: No tremor Psych: Mood stable, speech normal, affect appropriate     10/31/2022    4:17 PM 05/18/2022   10:04 AM 01/19/2021    9:40 AM  Depression screen PHQ 2/9  Decreased Interest 0 1 0  Down, Depressed, Hopeless 0 2 0  PHQ - 2 Score 0 3 0  Altered sleeping 1 2   Tired, decreased energy 1 2   Change in appetite 0 1   Feeling bad or failure about yourself  1 2   Trouble concentrating 0 1   Moving slowly or fidgety/restless 0 1   Suicidal thoughts 0 0   PHQ-9 Score 3 12   Difficult doing work/chores Somewhat difficult Somewhat difficult       10/31/2022    4:17 PM 05/18/2022   10:05 AM 04/20/2020    4:07 PM  GAD 7 : Generalized Anxiety Score  Nervous, Anxious, on Edge 2 3 1  $ Control/stop worrying 2 2 1  $ Worry too much - different things 2 2 1  $ Trouble relaxing 1 2 2  $ Restless 0 0 0  Easily annoyed or irritable 0 0 0  Afraid - awful might happen 1 2 0  Total GAD 7 Score 8 11 5  $ Anxiety Difficulty Somewhat difficult Somewhat difficult     Assessment/ Plan: 57 y.o. male   GAD (generalized anxiety disorder) - Plan: ALPRAZolam (XANAX) 1 MG tablet, desvenlafaxine (PRISTIQ) 50 MG 24 hr tablet  Depression, recurrent (HCC) - Plan: desvenlafaxine (PRISTIQ) 50 MG 24 hr tablet  Essential hypertension - Plan: losartan-hydrochlorothiazide (HYZAAR) 50-12.5 MG tablet  Primary insomnia - Plan: zolpidem (AMBIEN) 10 MG tablet  Mixed hyperlipidemia - Plan: rosuvastatin (CRESTOR) 40 MG tablet, Lipid panel  Postoperative hypothyroidism - Plan: TSH, T4, free, levothyroxine (SYNTHROID) 137 MCG tablet, levothyroxine (SYNTHROID) 150 MCG tablet, TSH, T4, Free  Anxiety is intermittently exacerbated by the mental health of his son.  Otherwise seems to be stable.  Medications have been  renewed.  UDS and CSA are up-to-date.  National narcotic database reviewed and there were no red flags.  Blood pressure controlled.  Due for cholesterol panel but nonfasting so future orders placed in anticipation of physical.  He knows that he needs to schedule this prior to departure today  Check thyroid levels.  Thyroid medications have been renewed.  No orders of the defined types were placed in this encounter.  No orders of the defined types were placed in this encounter.    Janora Norlander, DO [facility] (  [phone]   

## 2022-11-01 LAB — TSH: TSH: 1.06 u[IU]/mL (ref 0.450–4.500)

## 2022-11-01 LAB — T4, FREE: Free T4: 1.66 ng/dL (ref 0.82–1.77)

## 2022-11-05 ENCOUNTER — Telehealth: Payer: Self-pay

## 2022-11-05 NOTE — Telephone Encounter (Signed)
Kenneth [name] (Key: [id number]) PA Case ID #: [id number] Rx #: [id number] Need Help? Call us at [phone] Status sent iconSent to Plan today Drug Desvenlafaxine Succinate ER '50MG'$  er tablets ePA cloud Child psychotherapist Electronic PA Form [phone] NCPDP)

## 2022-11-09 ENCOUNTER — Other Ambulatory Visit (HOSPITAL_COMMUNITY): Payer: Self-pay

## 2022-11-09 NOTE — Telephone Encounter (Signed)
PA has been approved.   Ran test claim, last filled 11/06/22. Next fill is 11/30/22.   PRIOR AUTHORIZATION EXPIRES ON 11/07/23

## 2022-11-19 ENCOUNTER — Other Ambulatory Visit: Payer: Self-pay | Admitting: Family Medicine

## 2023-02-13 DIAGNOSIS — L910 Hypertrophic scar: Secondary | ICD-10-CM | POA: Diagnosis not present

## 2023-02-13 DIAGNOSIS — S63641A Sprain of metacarpophalangeal joint of right thumb, initial encounter: Secondary | ICD-10-CM | POA: Diagnosis not present

## 2023-02-13 DIAGNOSIS — M79644 Pain in right finger(s): Secondary | ICD-10-CM | POA: Diagnosis not present

## 2023-02-13 DIAGNOSIS — S6431XA Injury of digital nerve of right thumb, initial encounter: Secondary | ICD-10-CM | POA: Diagnosis not present

## 2023-02-14 DIAGNOSIS — S63641A Sprain of metacarpophalangeal joint of right thumb, initial encounter: Secondary | ICD-10-CM | POA: Diagnosis not present

## 2023-02-14 DIAGNOSIS — S6431XA Injury of digital nerve of right thumb, initial encounter: Secondary | ICD-10-CM | POA: Diagnosis not present

## 2023-02-14 DIAGNOSIS — L910 Hypertrophic scar: Secondary | ICD-10-CM | POA: Diagnosis not present

## 2023-02-14 DIAGNOSIS — M79644 Pain in right finger(s): Secondary | ICD-10-CM | POA: Diagnosis not present

## 2023-02-23 ENCOUNTER — Other Ambulatory Visit: Payer: Self-pay | Admitting: Family Medicine

## 2023-02-23 DIAGNOSIS — F411 Generalized anxiety disorder: Secondary | ICD-10-CM

## 2023-03-15 DIAGNOSIS — L910 Hypertrophic scar: Secondary | ICD-10-CM | POA: Diagnosis not present

## 2023-03-15 DIAGNOSIS — S6431XA Injury of digital nerve of right thumb, initial encounter: Secondary | ICD-10-CM | POA: Diagnosis not present

## 2023-03-15 DIAGNOSIS — S63641A Sprain of metacarpophalangeal joint of right thumb, initial encounter: Secondary | ICD-10-CM | POA: Diagnosis not present

## 2023-04-01 ENCOUNTER — Other Ambulatory Visit: Payer: Self-pay | Admitting: Family Medicine

## 2023-04-19 ENCOUNTER — Encounter: Payer: Self-pay | Admitting: Family Medicine

## 2023-04-19 ENCOUNTER — Ambulatory Visit (INDEPENDENT_AMBULATORY_CARE_PROVIDER_SITE_OTHER): Payer: 59 | Admitting: Family Medicine

## 2023-04-19 VITALS — BP 125/82 | HR 97 | Temp 98.0°F | Ht 71.0 in | Wt 180.0 lb

## 2023-04-19 DIAGNOSIS — F5101 Primary insomnia: Secondary | ICD-10-CM | POA: Diagnosis not present

## 2023-04-19 DIAGNOSIS — F339 Major depressive disorder, recurrent, unspecified: Secondary | ICD-10-CM

## 2023-04-19 DIAGNOSIS — Z0001 Encounter for general adult medical examination with abnormal findings: Secondary | ICD-10-CM

## 2023-04-19 DIAGNOSIS — Z79899 Other long term (current) drug therapy: Secondary | ICD-10-CM

## 2023-04-19 DIAGNOSIS — F411 Generalized anxiety disorder: Secondary | ICD-10-CM | POA: Diagnosis not present

## 2023-04-19 DIAGNOSIS — I10 Essential (primary) hypertension: Secondary | ICD-10-CM

## 2023-04-19 DIAGNOSIS — R7989 Other specified abnormal findings of blood chemistry: Secondary | ICD-10-CM

## 2023-04-19 DIAGNOSIS — Z125 Encounter for screening for malignant neoplasm of prostate: Secondary | ICD-10-CM

## 2023-04-19 DIAGNOSIS — J029 Acute pharyngitis, unspecified: Secondary | ICD-10-CM | POA: Diagnosis not present

## 2023-04-19 DIAGNOSIS — Z0000 Encounter for general adult medical examination without abnormal findings: Secondary | ICD-10-CM

## 2023-04-19 DIAGNOSIS — J069 Acute upper respiratory infection, unspecified: Secondary | ICD-10-CM

## 2023-04-19 DIAGNOSIS — E890 Postprocedural hypothyroidism: Secondary | ICD-10-CM | POA: Diagnosis not present

## 2023-04-19 DIAGNOSIS — Z130 Encounter for screening for diseases of the blood and blood-forming organs and certain disorders involving the immune mechanism: Secondary | ICD-10-CM

## 2023-04-19 DIAGNOSIS — K120 Recurrent oral aphthae: Secondary | ICD-10-CM | POA: Diagnosis not present

## 2023-04-19 DIAGNOSIS — E782 Mixed hyperlipidemia: Secondary | ICD-10-CM | POA: Diagnosis not present

## 2023-04-19 LAB — DRUG SCREEN 10 W/CONF, SERUM

## 2023-04-19 LAB — CMP14+EGFR

## 2023-04-19 LAB — CBC
Hematocrit: 46.6 % (ref 37.5–51.0)
Hemoglobin: 15.6 g/dL (ref 13.0–17.7)
MCH: 29 pg (ref 26.6–33.0)
MCHC: 33.5 g/dL (ref 31.5–35.7)
MCV: 87 fL (ref 79–97)
Platelets: 317 10*3/uL (ref 150–450)
RBC: 5.38 x10E6/uL (ref 4.14–5.80)
RDW: 12.7 % (ref 11.6–15.4)
WBC: 7.3 10*3/uL (ref 3.4–10.8)

## 2023-04-19 LAB — T4, FREE

## 2023-04-19 LAB — RAPID STREP SCREEN (MED CTR MEBANE ONLY): Strep Gp A Ag, IA W/Reflex: NEGATIVE

## 2023-04-19 LAB — TSH

## 2023-04-19 LAB — PSA

## 2023-04-19 LAB — CULTURE, GROUP A STREP

## 2023-04-19 MED ORDER — ZOLPIDEM TARTRATE 10 MG PO TABS
5.0000 mg | ORAL_TABLET | Freq: Every evening | ORAL | 5 refills | Status: DC | PRN
Start: 2023-04-27 — End: 2023-10-28

## 2023-04-19 MED ORDER — ALPRAZOLAM 1 MG PO TABS
0.5000 mg | ORAL_TABLET | Freq: Two times a day (BID) | ORAL | 2 refills | Status: DC | PRN
Start: 2023-04-19 — End: 2023-11-05

## 2023-04-19 MED ORDER — LEVOTHYROXINE SODIUM 150 MCG PO TABS
ORAL_TABLET | ORAL | 3 refills | Status: DC
Start: 2023-04-19 — End: 2023-10-28

## 2023-04-19 MED ORDER — LOSARTAN POTASSIUM-HCTZ 50-12.5 MG PO TABS
1.0000 | ORAL_TABLET | Freq: Every day | ORAL | 3 refills | Status: DC
Start: 2023-04-19 — End: 2024-01-29

## 2023-04-19 MED ORDER — ROSUVASTATIN CALCIUM 40 MG PO TABS
40.0000 mg | ORAL_TABLET | Freq: Every day | ORAL | 3 refills | Status: DC
Start: 2023-04-19 — End: 2024-01-29

## 2023-04-19 MED ORDER — DESVENLAFAXINE SUCCINATE ER 50 MG PO TB24
50.0000 mg | ORAL_TABLET | Freq: Every day | ORAL | 3 refills | Status: DC
Start: 2023-04-19 — End: 2024-01-29

## 2023-04-19 MED ORDER — LEVOTHYROXINE SODIUM 137 MCG PO TABS
ORAL_TABLET | ORAL | 3 refills | Status: DC
Start: 2023-04-19 — End: 2023-10-28

## 2023-04-19 NOTE — Progress Notes (Signed)
Kenneth [name] is a 57 y.o. male presents to office today for annual physical exam examination.    Concerns today include: 1. COVID? Reports onset of sore throat, fever, mild cough, right ear pain and canker sores Saturday.  The fever resolved but he still has sores on his inner lip and the right ear pain.  He has been using Motrin, Tylenol, salt water gargles, honey and heating pads and efforts alleviate symptoms.  No known sick contacts.  Has not self tested for COVID-19.  Occupation: owns his own business, Marital status: married, Substance use: none Health Maintenance Due  Topic Date Due   COVID-19 Vaccine (6 - 2023-24 season) 05/11/2022   INFLUENZA VACCINE  04/11/2023   Refills needed today: all  Immunization History  Administered Date(s) Administered   Influenza Split 06/18/2013   Influenza,inj,Quad PF,6+ Mos 06/05/2016, 06/11/2017, 08/22/2018, 06/23/2021, 07/17/2022   Influenza,inj,quad, With Preservative 06/08/2019   Influenza-Unspecified 08/02/2014, 08/15/2015, 06/08/2019   Moderna Sars-Covid-2 Vaccination 01/04/2020, 02/01/2020, 08/30/2020, 04/11/2021, 11/28/2021   Tdap 09/19/2022   Zoster Recombinant(Shingrix) 04/25/2021, 06/23/2021   Past Medical History:  Diagnosis Date   Complication of anesthesia    combative waking up after back surgery   DDD (degenerative disc disease), lumbar    Hyperlipidemia    Hypertension    Hypothyroidism    Social History   Socioeconomic History   Marital status: Married    Spouse name: Not on file   Number of children: Not on file   Years of education: Not on file   Highest education level: Not on file  Occupational History   Not on file  Tobacco Use   Smoking status: Never   Smokeless tobacco: Never  Vaping Use   Vaping status: Never Used  Substance and Sexual Activity   Alcohol use: No   Drug use: No   Sexual activity: Not on file  Other Topics Concern   Not on file  Social History Narrative   Not on file    Social Determinants of Health   Financial Resource Strain: Not on file  Food Insecurity: Not on file  Transportation Needs: Not on file  Physical Activity: Not on file  Stress: Not on file  Social Connections: Not on file  Intimate Partner Violence: Not on file   Past Surgical History:  Procedure Laterality Date   BACK SURGERY     disectomy   MASS EXCISION Left 01/06/2021   Procedure: EXCISION, CYST, BUTTOCK;  Surgeon: Franky Macho, MD;  Location: [facility];  Service: General;  Laterality: Left;   THYROIDECTOMY  2010   Family History  Problem Relation Age of Onset   Diabetes Mother    Hypertension Mother    Stroke Mother    Hypertrophic cardiomyopathy Mother    Hyperlipidemia Father    Hypertension Father    Heart attack Father    Diabetes Father     Current Outpatient Medications:    ALPRAZolam (XANAX) 1 MG tablet, Take 0.5-1 tablets (0.5-1 mg total) by mouth 2 (two) times daily as needed for anxiety (place on hold)., Disp: 60 tablet, Rfl: 2   desvenlafaxine (PRISTIQ) 50 MG 24 hr tablet, Take 1 tablet (50 mg total) by mouth daily., Disp: 90 tablet, Rfl: 3   fenofibrate 160 MG tablet, TAKE ONE TABLET BY MOUTH EVERY DAY, Disp: 90 tablet, Rfl: 3   hydrocortisone (ANUSOL-HC) 2.5 % rectal cream, PLACE 1 APPLICATORFUL IN RECTUM 2 TIMES A DAY AS NEEDED FOR HEMORRHOIDS, Disp: 30 g, Rfl: 1  levothyroxine (SYNTHROID) 137 MCG tablet, Take 1 tablet daily except Saturday and Sunday, Disp: 90 tablet, Rfl: 3   levothyroxine (SYNTHROID) 150 MCG tablet, Take 1 tablet Sat/ Sun, Disp: 30 tablet, Rfl: 3   losartan-hydrochlorothiazide (HYZAAR) 50-12.5 MG tablet, Take 1 tablet by mouth daily., Disp: 90 tablet, Rfl: 3   naproxen (NAPROSYN) 500 MG tablet, TAKE 1 TABLET 2 TIMES A DAY AS NEEDED FOR MODERATE PAIN, Disp: 60 tablet, Rfl: 6   rosuvastatin (CRESTOR) 40 MG tablet, Take 1 tablet (40 mg total) by mouth daily., Disp: 90 tablet, Rfl: 3   zolpidem (AMBIEN) 10 MG tablet, Take 0.5-1 tablets  (5-10 mg total) by mouth at bedtime as needed for sleep (put on file)., Disp: 30 tablet, Rfl: 5  No Known Allergies   ROS: Review of Systems A comprehensive review of systems was negative except for: Ears, nose, mouth, throat, and face: positive for earaches, sore mouth, and sore throat Respiratory: positive for cough Genitourinary: positive for hesitancy Behavioral/Psych: positive for anxiety and depression    Physical exam BP 125/82   Pulse 97   Temp 98 F (36.7 C)   Ht 5\' 11"  (1.803 m)   Wt 180 lb (81.6 kg)   SpO2 97%   BMI 25.10 kg/m  General appearance: alert, cooperative, appears stated age, and no distress Head: Normocephalic, without obvious abnormality, atraumatic Eyes: negative findings: lids and lashes normal, conjunctivae and sclerae normal, corneas clear, and pupils equal, round, reactive to light and accomodation Ears: normal TM's and external ear canals both ears and scant cerumen in external auditory canal Nose: Nares normal. Septum midline. Mucosa normal. No drainage or sinus tenderness. Throat:  Oropharyngeal erythema present.  No exudates.  He has an aphthous ulcer noted along the right inner lower lip Neck: no adenopathy, no carotid bruit, supple, symmetrical, trachea midline, and thyroid not enlarged, symmetric, no tenderness/mass/nodules Back: symmetric, no curvature. ROM normal. No CVA tenderness. Lungs: clear to auscultation bilaterally Chest wall: no tenderness Heart: regular rate and rhythm, S1, S2 normal, no murmur, click, rub or gallop Abdomen: soft, non-tender; bowel sounds normal; no masses,  no organomegaly Extremities: extremities normal, atraumatic, no cyanosis or edema Pulses: 2+ and symmetric Skin: Skin color, texture, turgor normal. No rashes or lesions Lymph nodes: Cervical, supraclavicular, and axillary nodes normal. Neurologic: Grossly normal      04/19/2023    8:55 AM 10/31/2022    4:17 PM 05/18/2022   10:04 AM  Depression screen PHQ 2/9   Decreased Interest 1 0 1  Down, Depressed, Hopeless 1 0 2  PHQ - 2 Score 2 0 3  Altered sleeping 2 1 2   Tired, decreased energy 1 1 2   Change in appetite 0 0 1  Feeling bad or failure about yourself  2 1 2   Trouble concentrating 0 0 1  Moving slowly or fidgety/restless 0 0 1  Suicidal thoughts 0 0 0  PHQ-9 Score 7 3 12   Difficult doing work/chores Not difficult at all Somewhat difficult Somewhat difficult      04/19/2023    8:54 AM 10/31/2022    4:17 PM 07/17/2022   10:13 AM 05/18/2022   10:05 AM  GAD 7 : Generalized Anxiety Score  Nervous, Anxious, on Edge 1 2 -- 3  Control/stop worrying 2 2  2   Worry too much - different things 3 2  2   Trouble relaxing 1 1  2   Restless 0 0  0  Easily annoyed or irritable 0 0  0  Afraid -  awful might happen 2 1  2   Total GAD 7 Score 9 8  11   Anxiety Difficulty  Somewhat difficult  Somewhat difficult     Assessment/ Plan: Kenneth [name] here for annual physical exam.   Annual physical exam  Canker sore  Upper respiratory tract infection, unspecified type - Plan: COVID-19, Flu A+B and RSV, Rapid Strep Screen ([facility] ONLY)  Postoperative hypothyroidism - Plan: TSH, T4, Free, levothyroxine (SYNTHROID) 150 MCG tablet, levothyroxine (SYNTHROID) 137 MCG tablet  Depression, recurrent (HCC) - Plan: CMP14+EGFR, desvenlafaxine (PRISTIQ) 50 MG 24 hr tablet  GAD (generalized anxiety disorder) - Plan: ToxASSURE Select 13 (MW), Urine, CMP14+EGFR, Drug Screen 10 W/Conf, Se, ALPRAZolam (XANAX) 1 MG tablet, desvenlafaxine (PRISTIQ) 50 MG 24 hr tablet  Primary insomnia - Plan: ToxASSURE Select 13 (MW), Urine, CMP14+EGFR, Drug Screen 10 W/Conf, Se, zolpidem (AMBIEN) 10 MG tablet  Controlled substance agreement signed - Plan: ToxASSURE Select 13 (MW), Urine, Drug Screen 10 W/Conf, Se  Essential hypertension - Plan: CMP14+EGFR, losartan-hydrochlorothiazide (HYZAAR) 50-12.5 MG tablet  Mixed hyperlipidemia - Plan: CMP14+EGFR, rosuvastatin  (CRESTOR) 40 MG tablet, CANCELED: Lipid Panel  Screening for malignant neoplasm of prostate - Plan: PSA  Screening, anemia, deficiency, iron - Plan: CBC  Suspect viral process and likely COVID given rise locally.  Unfortunately outside of the treatment window.  Supportive care recommended.  Offered treatment for aphthous ulcer but sounds like he is doing okay with OTC management of URI so far  Check nonfasting labs.  Lipid deferred to next visit.  Blood drug panel and CSC were updated as per office policy for alprazolam and Ambien.  The national narcotic database reviewed and there were no red flags.  These issues are chronic and stable  Blood pressure is controlled.  Continue Hyzaar.  Has been renewed.  Check renal function  Crestor renewed.  Plan for lipid at next visit  Some hesitancy with only once nightly urination.  Check PSA  Screening CBC also collected today  Counseled on healthy lifestyle choices, including diet (rich in fruits, vegetables and lean meats and low in salt and simple carbohydrates) and exercise (at least 30 minutes of moderate physical activity daily).  Patient to follow up 42m for FLP and UDS. Appt scheduled   M. Nadine Counts, DO

## 2023-04-22 ENCOUNTER — Other Ambulatory Visit: Payer: Self-pay | Admitting: Family Medicine

## 2023-04-22 ENCOUNTER — Encounter: Payer: Self-pay | Admitting: Family Medicine

## 2023-04-22 DIAGNOSIS — R7989 Other specified abnormal findings of blood chemistry: Secondary | ICD-10-CM

## 2023-04-22 NOTE — Addendum Note (Signed)
Addended by: Raliegh Ip on: 04/22/2023 01:00 PM   Modules accepted: Orders

## 2023-04-25 ENCOUNTER — Encounter: Payer: Self-pay | Admitting: Internal Medicine

## 2023-05-03 ENCOUNTER — Ambulatory Visit (HOSPITAL_COMMUNITY)
Admission: RE | Admit: 2023-05-03 | Discharge: 2023-05-03 | Disposition: A | Discharge status: Home / Self Care | Payer: 59 | Source: Ambulatory Visit | Attending: Family Medicine | Admitting: Family Medicine

## 2023-05-03 DIAGNOSIS — R7989 Other specified abnormal findings of blood chemistry: Secondary | ICD-10-CM | POA: Diagnosis not present

## 2023-05-03 DIAGNOSIS — K760 Fatty (change of) liver, not elsewhere classified: Secondary | ICD-10-CM | POA: Diagnosis not present

## 2023-05-08 ENCOUNTER — Other Ambulatory Visit: Payer: 59

## 2023-05-08 DIAGNOSIS — E890 Postprocedural hypothyroidism: Secondary | ICD-10-CM | POA: Diagnosis not present

## 2023-05-08 DIAGNOSIS — R7989 Other specified abnormal findings of blood chemistry: Secondary | ICD-10-CM

## 2023-05-08 DIAGNOSIS — E782 Mixed hyperlipidemia: Secondary | ICD-10-CM | POA: Diagnosis not present

## 2023-05-09 ENCOUNTER — Other Ambulatory Visit: Payer: Self-pay | Admitting: Family Medicine

## 2023-05-09 LAB — ACUTE HEP PANEL AND HEP B SURFACE AB
Hep A IgM: NEGATIVE
Hep B C IgM: NEGATIVE
Hep C Virus Ab: NONREACTIVE
Hepatitis B Surf Ab Quant: <3.5 m[IU]/mL — ABNORMAL LOW
Hepatitis B Surface Ag: NEGATIVE

## 2023-05-09 LAB — HEPATIC FUNCTION PANEL
ALT: 51 IU/L — ABNORMAL HIGH (ref 0–44)
AST: 38 IU/L (ref 0–40)
Albumin: 4.5 g/dL (ref 3.8–4.9)
Alkaline Phosphatase: 81 IU/L (ref 44–121)
Bilirubin Total: 0.7 mg/dL (ref 0.0–1.2)
Bilirubin, Direct: 0.16 mg/dL (ref 0.00–0.40)
Total Protein: 7.1 g/dL (ref 6.0–8.5)

## 2023-05-09 LAB — LIPID PANEL
Chol/HDL Ratio: 5.1 ratio — ABNORMAL HIGH (ref 0.0–5.0)
Cholesterol, Total: 210 mg/dL — ABNORMAL HIGH (ref 100–199)
HDL: 41 mg/dL (ref >39)
LDL Chol Calc (NIH): 142 mg/dL — ABNORMAL HIGH (ref 0–99)
Triglycerides: 150 mg/dL — ABNORMAL HIGH (ref 0–149)
VLDL Cholesterol Cal: 27 mg/dL (ref 5–40)

## 2023-05-09 LAB — HIV ANTIBODY (ROUTINE TESTING W REFLEX): HIV Screen 4th Generation wRfx: NONREACTIVE

## 2023-05-09 LAB — T4, FREE: Free T4: 1.32 ng/dL (ref 0.82–1.77)

## 2023-05-09 LAB — TSH: TSH: 9.29 u[IU]/mL — ABNORMAL HIGH (ref 0.450–4.500)

## 2023-05-10 ENCOUNTER — Other Ambulatory Visit: Payer: Self-pay | Admitting: Family Medicine

## 2023-05-10 DIAGNOSIS — E890 Postprocedural hypothyroidism: Secondary | ICD-10-CM

## 2023-05-21 DIAGNOSIS — L298 Other pruritus: Secondary | ICD-10-CM | POA: Diagnosis not present

## 2023-05-21 DIAGNOSIS — L820 Inflamed seborrheic keratosis: Secondary | ICD-10-CM | POA: Diagnosis not present

## 2023-05-21 DIAGNOSIS — D225 Melanocytic nevi of trunk: Secondary | ICD-10-CM | POA: Diagnosis not present

## 2023-05-21 DIAGNOSIS — L218 Other seborrheic dermatitis: Secondary | ICD-10-CM | POA: Diagnosis not present

## 2023-05-21 DIAGNOSIS — L538 Other specified erythematous conditions: Secondary | ICD-10-CM | POA: Diagnosis not present

## 2023-05-21 DIAGNOSIS — L814 Other melanin hyperpigmentation: Secondary | ICD-10-CM | POA: Diagnosis not present

## 2023-05-21 DIAGNOSIS — Z789 Other specified health status: Secondary | ICD-10-CM | POA: Diagnosis not present

## 2023-05-21 DIAGNOSIS — L821 Other seborrheic keratosis: Secondary | ICD-10-CM | POA: Diagnosis not present

## 2023-06-25 ENCOUNTER — Ambulatory Visit: Payer: 59 | Admitting: Internal Medicine

## 2023-06-25 ENCOUNTER — Other Ambulatory Visit (INDEPENDENT_AMBULATORY_CARE_PROVIDER_SITE_OTHER): Payer: 59

## 2023-06-25 ENCOUNTER — Encounter: Payer: Self-pay | Admitting: Internal Medicine

## 2023-06-25 VITALS — BP 128/80 | HR 82 | Ht 71.0 in | Wt 190.0 lb

## 2023-06-25 DIAGNOSIS — Z1211 Encounter for screening for malignant neoplasm of colon: Secondary | ICD-10-CM

## 2023-06-25 DIAGNOSIS — R7989 Other specified abnormal findings of blood chemistry: Secondary | ICD-10-CM

## 2023-06-25 DIAGNOSIS — K219 Gastro-esophageal reflux disease without esophagitis: Secondary | ICD-10-CM

## 2023-06-25 LAB — IBC + FERRITIN
Ferritin: 77.9 ng/mL (ref 22.0–322.0)
Iron: 100 ug/dL (ref 42–165)
Saturation Ratios: 20.9 % (ref 20.0–50.0)
TIBC: 477.4 ug/dL — ABNORMAL HIGH (ref 250.0–450.0)
Transferrin: 341 mg/dL (ref 212.0–360.0)

## 2023-06-25 LAB — PROTIME-INR
INR: 1 {ratio} (ref 0.8–1.0)
Prothrombin Time: 10.7 s (ref 9.6–13.1)

## 2023-06-25 LAB — HEPATIC FUNCTION PANEL
ALT: 41 U/L (ref 0–53)
AST: 30 U/L (ref 0–37)
Albumin: 4.5 g/dL (ref 3.5–5.2)
Alkaline Phosphatase: 58 U/L (ref 39–117)
Bilirubin, Direct: 0.1 mg/dL (ref 0.0–0.3)
Total Bilirubin: 0.5 mg/dL (ref 0.2–1.2)
Total Protein: 7.4 g/dL (ref 6.0–8.3)

## 2023-06-25 MED ORDER — PANTOPRAZOLE SODIUM 40 MG PO TBEC: 40.0000 mg | DELAYED_RELEASE_TABLET | Freq: Every day | ORAL | 11 refills | Status: DC

## 2023-06-25 MED ORDER — NA SULFATE-K SULFATE-MG SULF 17.5-3.13-1.6 GM/177ML PO SOLN: 1.0000 | ORAL | 0 refills | Status: DC

## 2023-06-25 NOTE — Patient Instructions (Signed)
Your provider has requested that you go to the basement level for lab work before leaving today. Press "B" on the elevator. The lab is located at the first door on the left as you exit the elevator.  We have sent the following medications to your pharmacy for you to pick up at your convenience: Suprep   You have been scheduled for an endoscopy and colonoscopy. Please follow the written instructions given to you at your visit today.  Please pick up your prep supplies at the pharmacy within the next 1-3 days.  If you use inhalers (even only as needed), please bring them with you on the day of your procedure.  DO NOT TAKE 7 DAYS PRIOR TO TEST- Trulicity (dulaglutide) Ozempic, Wegovy (semaglutide) Mounjaro (tirzepatide) Bydureon Bcise (exanatide extended release)  DO NOT TAKE 1 DAY PRIOR TO YOUR TEST Rybelsus (semaglutide) Adlyxin (lixisenatide) Victoza (liraglutide) Byetta (exanatide) ___________________________________________________________________________  Due to recent changes in healthcare laws, you may see the results of your imaging and laboratory studies on MyChart before your provider has had a chance to review them.  We understand that in some cases there may be results that are confusing or concerning to you. Not all laboratory results come back in the same time frame and the provider may be waiting for multiple results in order to interpret others.  Please give [place] 48 hours in order for your provider to thoroughly review all the results before contacting the office for clarification of your results.   Thank you for choosing me and [facility].  Dr. Yancey Flemings

## 2023-06-25 NOTE — Progress Notes (Signed)
HISTORY OF PRESENT ILLNESS:  Kenneth [name] is a 57 y.o. male, proprietor of cabinet making business, with past medical history as listed below including chronic back pain, who is sent today by his primary care provider regarding chronic intermittent abnormal liver tests.  He also has concerns over chronic GERD, esophageal cancer, and his next strategy for colon cancer screening.  He is accompanied by his wife.  First, the patient reports that he has had intermittent elevation of liver test off and on since his 4s.  He also states that his sister has a history of intermittent liver test abnormalities without cause identified.  He denies a family history of cirrhosis.  The patient himself does not use alcohol.  He has not had prior transfusion.  Review of liver test from 2 years ago showed mildly elevated transaminases with AST 57 and ALT 25.  Other liver tests, proteins, and globulins were normal.  Liver tests from 1 year ago were normal.  About 2 months ago and doing blood work on 04/19/2023.  At that time he was COVID-positive.  He was noted to have a AST of 74 and ALT of 105.  Other liver tests normal.  Repeat liver test 2 weeks later were improved with AST 38 and ALT 51.  He did undergo hepatitis AB and C studies which were negative.  An abdominal ultrasound suggested fatty liver.  The patient's weight has been stable.  Next, he reports longstanding problems with chronic reflux disease for which he takes various agents such as an acids and Alka-Seltzer.  No dysphagia.  He is concerned about possible Barrett's and esophageal cancer having had a relative recently diagnosed with stage IV esophageal cancer.  He has not had screening upper endoscopy despite longstanding symptoms.  He does not smoke.  Finally, he want to consider colon cancer screening strategies.  He has had Cologuard previously on 2 occasions which have been negative.  Last testing was performed over 2 years ago.  He would like to consider  colonoscopy as a next strategy.  He has no significant lower GI complaints though does mention that he tends to have an extra bowel movement per day compared to previous years.  No bleeding.  Laboratory and x-ray reviewed as outlined above  REVIEW OF SYSTEMS:  All non-GI ROS negative unless otherwise stated in the HPI except for back pain  Past Medical History:  Diagnosis Date   Anxiety    Complication of anesthesia    combative waking up after back surgery   DDD (degenerative disc disease), lumbar    Depression    Hyperlipidemia    Hypertension    Hypothyroidism     Past Surgical History:  Procedure Laterality Date   BACK SURGERY     disectomy   MASS EXCISION Left 01/06/2021   Procedure: EXCISION, CYST, BUTTOCK;  Surgeon: Franky Macho, MD;  Location: [facility];  Service: General;  Laterality: Left;   THYROIDECTOMY  2010    Social History DEMARLO RIOJAS  reports that he has never smoked. He has never used smokeless tobacco. He reports that he does not drink alcohol and does not use drugs.  family history includes Diabetes in his father and mother; Heart attack in his father; Hyperlipidemia in his father; Hypertension in his father and mother; Hypertrophic cardiomyopathy in his mother; Stroke in his mother.  No Known Allergies     PHYSICAL EXAMINATION: Vital signs: BP 128/80   Pulse 82   Ht 5\' 11"  (1.803 m)  Wt 190 lb (86.2 kg)   BMI 26.50 kg/m   Constitutional: generally well-appearing, no acute distress Psychiatric: alert and oriented x3, cooperative Eyes: extraocular movements intact, anicteric, conjunctiva pink Mouth: oral pharynx moist, no lesions Neck: supple no lymphadenopathy Cardiovascular: heart regular rate and rhythm, no murmur Lungs: clear to auscultation bilaterally Abdomen: soft, nontender, nondistended, no obvious ascites, no peritoneal signs, normal bowel sounds, no organomegaly Rectal: Deferred until colonoscopy Extremities: no clubbing,  cyanosis, or lower extremity edema bilaterally Skin: no lesions on visible extremities Neuro: No focal deficits.  Cranial nerves intact  ASSESSMENT:  1.  Elevated hepatic transaminases.  Intermittent and mild.  Most recent elevations likely secondary to acute COVID infection.  Had improved 2 weeks later.  Rule out other causes for chronic elevation of hepatic transaminases. 2.  Chronic GERD.  Requires frequent acid suppressive therapy.  Rule out Barrett's esophagus. 3.  Colon cancer screening.  Due.  Interested in optical colonoscopy   PLAN:  1.  Expanded laboratory testing to assess for various causes of elevated hepatic transaminases 2.  Reflux precautions 3.  Prescribe pantoprazole 40 mg daily.  Medication risks reviewed 4.  Schedule upper endoscopy to screen for Barrett's esophagus.The nature of the procedure, as well as the risks, benefits, and alternatives were carefully and thoroughly reviewed with the patient. Ample time for discussion and questions allowed. The patient understood, was satisfied, and agreed to proceed. 5.  Screening colonoscopy.The nature of the procedure, as well as the risks, benefits, and alternatives were carefully and thoroughly reviewed with the patient. Ample time for discussion and questions allowed. The patient understood, was satisfied, and agreed to proceed. 6.  Further recommendations and follow-up after the above A total time of 60 minutes was spent preparing to see the patient, reviewing [facility] outside records and data, obtaining comprehensive history, performing medically appropriate physical examination, counseling and educating the patient and his wife regarding the above listed issues, ordering medication, ordering multiple endoscopic procedures, ordering blood work, and documenting clinical information in the health record

## 2023-07-01 LAB — ALPHA-1-ANTITRYPSIN: A-1 Antitrypsin, Ser: 139 mg/dL (ref 83–199)

## 2023-07-01 LAB — ANTI-NUCLEAR AB-TITER (ANA TITER): ANA Titer 1: 1:320 {titer} — ABNORMAL HIGH

## 2023-07-01 LAB — CERULOPLASMIN: Ceruloplasmin: 21 mg/dL (ref 14–30)

## 2023-07-01 LAB — ANTI-SMOOTH MUSCLE ANTIBODY, IGG: Actin (Smooth Muscle) Antibody (IGG): <20 U (ref <20)

## 2023-07-01 LAB — ANA: Anti Nuclear Antibody (ANA): POSITIVE — AB

## 2023-07-01 LAB — TISSUE TRANSGLUTAMINASE, IGA: (tTG) Ab, IgA: <1 U/mL

## 2023-07-01 LAB — MITOCHONDRIAL ANTIBODIES: Mitochondrial M2 Ab, IgG: <=20 U (ref <=20.0)

## 2023-07-02 ENCOUNTER — Other Ambulatory Visit: Payer: Self-pay | Admitting: Family Medicine

## 2023-07-02 DIAGNOSIS — R768 Other specified abnormal immunological findings in serum: Secondary | ICD-10-CM

## 2023-07-16 ENCOUNTER — Encounter: Payer: Self-pay | Admitting: Internal Medicine

## 2023-07-25 ENCOUNTER — Other Ambulatory Visit: Payer: Self-pay | Admitting: Family Medicine

## 2023-07-25 DIAGNOSIS — F411 Generalized anxiety disorder: Secondary | ICD-10-CM

## 2023-07-26 ENCOUNTER — Encounter: Payer: Self-pay | Admitting: Internal Medicine

## 2023-07-26 ENCOUNTER — Ambulatory Visit (AMBULATORY_SURGERY_CENTER): Payer: 59 | Admitting: Internal Medicine

## 2023-07-26 VITALS — BP 110/60 | HR 75 | Temp 98.1°F | Resp 12 | Ht 71.0 in | Wt 190.0 lb

## 2023-07-26 DIAGNOSIS — Z800 Family history of malignant neoplasm of digestive organs: Secondary | ICD-10-CM | POA: Diagnosis not present

## 2023-07-26 DIAGNOSIS — R7989 Other specified abnormal findings of blood chemistry: Secondary | ICD-10-CM

## 2023-07-26 DIAGNOSIS — K219 Gastro-esophageal reflux disease without esophagitis: Secondary | ICD-10-CM | POA: Diagnosis not present

## 2023-07-26 DIAGNOSIS — I10 Essential (primary) hypertension: Secondary | ICD-10-CM | POA: Diagnosis not present

## 2023-07-26 DIAGNOSIS — Z1211 Encounter for screening for malignant neoplasm of colon: Secondary | ICD-10-CM | POA: Diagnosis not present

## 2023-07-26 MED ORDER — SODIUM CHLORIDE 0.9 % IV SOLN: 500.0000 mL | INTRAVENOUS | Status: DC

## 2023-07-26 NOTE — Op Note (Signed)
[facility] Patient Name: Kenneth [name] Procedure Date: 07/26/2023 3:00 PM MRN: [id number] Endoscopist: Wilhemina Bonito. Marina Goodell , MD, [id number] Age: 57 Referring MD:  Date of Birth: [birth year] Gender: Male Account #: [id number] Procedure:                Upper GI endoscopy Indications:              Esophageal reflux; family history of esophageal                            cancer Medicines:                Monitored Anesthesia Care Procedure:                Pre-Anesthesia Assessment:                           - Prior to the procedure, a History and Physical                            was performed, and patient medications and                            allergies were reviewed. The patient's tolerance of                            previous anesthesia was also reviewed. The risks                            and benefits of the procedure and the sedation                            options and risks were discussed with the patient.                            All questions were answered, and informed consent                            was obtained. Prior Anticoagulants: The patient has                            taken no anticoagulant or antiplatelet agents. ASA                            Grade Assessment: II - A patient with mild systemic                            disease. After reviewing the risks and benefits,                            the patient was deemed in satisfactory condition to                            undergo the procedure.  After obtaining informed consent, the endoscope was                            passed under direct vision. Throughout the                            procedure, the patient's blood pressure, pulse, and                            oxygen saturations were monitored continuously. The                            GIF [id number] #[id number] was introduced through the                            mouth, and advanced to the second part of duodenum.                             The upper GI endoscopy was accomplished without                            difficulty. The patient tolerated the procedure                            well. Scope In: Scope Out: Findings:                 The esophagus was normal.                           The stomach was normal.                           The examined duodenum was normal.                           The cardia and gastric fundus were normal on                            retroflexion. Complications:            No immediate complications. Estimated Blood Loss:     Estimated blood loss: none. Impression:               - Normal esophagus.                           - Normal stomach.                           - Normal examined duodenum.                           - No specimens collected. Recommendation:           - Patient has a contact number available for  emergencies. The signs and symptoms of potential                            delayed complications were discussed with the                            patient. Return to normal activities tomorrow.                            Written discharge instructions were provided to the                            patient.                           - Resume previous diet.                           - Continue present medications. Wilhemina Bonito. Marina Goodell, MD 07/26/2023 3:31:37 PM This report has been signed electronically.

## 2023-07-26 NOTE — Progress Notes (Signed)
Expand All Collapse All HISTORY OF PRESENT ILLNESS:   Kenneth [name] is a 57 y.o. male, proprietor of cabinet making business, with past medical history as listed below including chronic back pain, who is sent today by his primary care provider regarding chronic intermittent abnormal liver tests.  He also has concerns over chronic GERD, esophageal cancer, and his next strategy for colon cancer screening.  He is accompanied by his wife.   First, the patient reports that he has had intermittent elevation of liver test off and on since his 57s.  He also states that his sister has a history of intermittent liver test abnormalities without cause identified.  He denies a family history of cirrhosis.  The patient himself does not use alcohol.  He has not had prior transfusion.  Review of liver test from 2 years ago showed mildly elevated transaminases with AST 57 and ALT 25.  Other liver tests, proteins, and globulins were normal.  Liver tests from 1 year ago were normal.  About 2 months ago and doing blood work on 04/19/2023.  At that time he was COVID-positive.  He was noted to have a AST of 74 and ALT of 105.  Other liver tests normal.  Repeat liver test 2 weeks later were improved with AST 38 and ALT 51.  He did undergo hepatitis AB and C studies which were negative.  An abdominal ultrasound suggested fatty liver.  The patient's weight has been stable.   Next, he reports longstanding problems with chronic reflux disease for which he takes various agents such as an acids and Alka-Seltzer.  No dysphagia.  He is concerned about possible Barrett's and esophageal cancer having had a relative recently diagnosed with stage IV esophageal cancer.  He has not had screening upper endoscopy despite longstanding symptoms.  He does not smoke.   Finally, he want to consider colon cancer screening strategies.  He has had Cologuard previously on 2 occasions which have been negative.  Last testing was performed over 2 years  ago.  He would like to consider colonoscopy as a next strategy.  He has no significant lower GI complaints though does mention that he tends to have an extra bowel movement per day compared to previous years.  No bleeding.   Laboratory and x-ray reviewed as outlined above   REVIEW OF SYSTEMS:   All non-GI ROS negative unless otherwise stated in the HPI except for back pain       Past Medical History:  Diagnosis Date   Anxiety     Complication of anesthesia      combative waking up after back surgery   DDD (degenerative disc disease), lumbar     Depression     Hyperlipidemia     Hypertension     Hypothyroidism                 Past Surgical History:  Procedure Laterality Date   BACK SURGERY        disectomy   MASS EXCISION Left 01/06/2021    Procedure: EXCISION, CYST, BUTTOCK;  Surgeon: Franky Macho, MD;  Location: [facility];  Service: General;  Laterality: Left;   THYROIDECTOMY   2010          Social History [place]  reports that he has never smoked. He has never used smokeless tobacco. He reports that he does not drink alcohol and does not use drugs.   family history includes Diabetes in his father and mother; Heart attack  in his father; Hyperlipidemia in his father; Hypertension in his father and mother; Hypertrophic cardiomyopathy in his mother; Stroke in his mother.   Allergies  No Known Allergies         PHYSICAL EXAMINATION: Vital signs: BP 128/80   Pulse 82   Ht 5\' 11"  (1.803 m)   Wt 190 lb (86.2 kg)   BMI 26.50 kg/m   Constitutional: generally well-appearing, no acute distress Psychiatric: alert and oriented x3, cooperative Eyes: extraocular movements intact, anicteric, conjunctiva pink Mouth: oral pharynx moist, no lesions Neck: supple no lymphadenopathy Cardiovascular: heart regular rate and rhythm, no murmur Lungs: clear to auscultation bilaterally Abdomen: soft, nontender, nondistended, no obvious ascites, no peritoneal signs, normal bowel  sounds, no organomegaly Rectal: Deferred until colonoscopy Extremities: no clubbing, cyanosis, or lower extremity edema bilaterally Skin: no lesions on visible extremities Neuro: No focal deficits.  Cranial nerves intact   ASSESSMENT:   1.  Elevated hepatic transaminases.  Intermittent and mild.  Most recent elevations likely secondary to acute COVID infection.  Had improved 2 weeks later.  Rule out other causes for chronic elevation of hepatic transaminases. 2.  Chronic GERD.  Requires frequent acid suppressive therapy.  Rule out Barrett's esophagus. 3.  Colon cancer screening.  Due.  Interested in optical colonoscopy     PLAN:   1.  Expanded laboratory testing to assess for various causes of elevated hepatic transaminases 2.  Reflux precautions 3.  Prescribe pantoprazole 40 mg daily.  Medication risks reviewed 4.  Schedule upper endoscopy to screen for Barrett's esophagus.The nature of the procedure, as well as the risks, benefits, and alternatives were carefully and thoroughly reviewed with the patient. Ample time for discussion and questions allowed. The patient understood, was satisfied, and agreed to proceed. 5.  Screening colonoscopy.The nature of the procedure, as well as the risks, benefits, and alternatives were carefully and thoroughly reviewed with the patient. Ample time for discussion and questions allowed. The patient understood, was satisfied, and agreed to proceed. 6.  Further recommendations and follow-up after the above

## 2023-07-26 NOTE — Progress Notes (Signed)
Patient was very insistent that "we" have a debate regarding politics.  I told him that it was not appropriate for me to speak about that at work.

## 2023-07-26 NOTE — Op Note (Signed)
[facility] Patient Name: Kenneth [name] Procedure Date: 07/26/2023 3:06 PM MRN: [id number] Endoscopist: Wilhemina Bonito. Marina Goodell , MD, [id number] Age: 57 Referring MD:  Date of Birth: 10/01/1965 Gender: Male Account #: [id number] Procedure:                Colonoscopy Indications:              Screening for colorectal malignant neoplasm Medicines:                Monitored Anesthesia Care Procedure:                Pre-Anesthesia Assessment:                           - Prior to the procedure, a History and Physical                            was performed, and patient medications and                            allergies were reviewed. The patient's tolerance of                            previous anesthesia was also reviewed. The risks                            and benefits of the procedure and the sedation                            options and risks were discussed with the patient.                            All questions were answered, and informed consent                            was obtained. Prior Anticoagulants: The patient has                            taken no anticoagulant or antiplatelet agents. ASA                            Grade Assessment: II - A patient with mild systemic                            disease. After reviewing the risks and benefits,                            the patient was deemed in satisfactory condition to                            undergo the procedure.                           After obtaining informed consent, the colonoscope  was passed under direct vision. Throughout the                            procedure, the patient's blood pressure, pulse, and                            oxygen saturations were monitored continuously. The                            Olympus Scope SN: [id number] was introduced through                            the anus and advanced to the the cecum, identified                            by appendiceal  orifice and ileocecal valve. The                            ileocecal valve, appendiceal orifice, and rectum                            were photographed. The quality of the bowel                            preparation was excellent. The colonoscopy was                            performed without difficulty. The patient tolerated                            the procedure well. The bowel preparation used was                            SUprep via split dose instruction. Scope In: 3:12:21 PM Scope Out: 3:21:38 PM Scope Withdrawal Time: 0 hours 7 minutes 51 seconds  Total Procedure Duration: 0 hours 9 minutes 17 seconds  Findings:                 The entire examined colon appeared normal on direct                            and retroflexion views. Complications:            No immediate complications. Estimated blood loss:                            None. Estimated Blood Loss:     Estimated blood loss: none. Impression:               - The entire examined colon is normal on direct and                            retroflexion views.                           -  No specimens collected. Recommendation:           - Repeat colonoscopy in 10 years for screening                            purposes.                           - Patient has a contact number available for                            emergencies. The signs and symptoms of potential                            delayed complications were discussed with the                            patient. Return to normal activities tomorrow.                            Written discharge instructions were provided to the                            patient.                           - Resume previous diet.                           - Continue present medications. Wilhemina Bonito. Marina Goodell, MD 07/26/2023 3:25:09 PM This report has been signed electronically.

## 2023-07-26 NOTE — Patient Instructions (Addendum)
Resume all of your previous medications today as ordered.  Read all of your discharge instructions.  YOU HAD AN ENDOSCOPIC PROCEDURE TODAY AT THE [facility]:   Refer to the procedure report that was given to you for any specific questions about what was found during the examination.  If the procedure report does not answer your questions, please call your gastroenterologist to clarify.  If you requested that your care partner not be given the details of your procedure findings, then the procedure report has been included in a sealed envelope for you to review at your convenience later.  YOU SHOULD EXPECT: Some feelings of bloating in the abdomen. Passage of more gas than usual.  Walking can help get rid of the air that was put into your GI tract during the procedure and reduce the bloating. If you had a lower endoscopy (such as a colonoscopy or flexible sigmoidoscopy) you may notice spotting of blood in your stool or on the toilet paper. If you underwent a bowel prep for your procedure, you may not have a normal bowel movement for a few days.  Please Note:  You might notice some irritation and congestion in your nose or some drainage.  This is from the oxygen used during your procedure.  There is no need for concern and it should clear up in a day or so.  SYMPTOMS TO REPORT IMMEDIATELY:  Following lower endoscopy (colonoscopy or flexible sigmoidoscopy):  Excessive amounts of blood in the stool  Significant tenderness or worsening of abdominal pains  Swelling of the abdomen that is new, acute  Fever of 100F or higher  Following upper endoscopy (EGD)  Vomiting of blood or coffee ground material  New chest pain or pain under the shoulder blades  Painful or persistently difficult swallowing  New shortness of breath  Fever of 100F or higher  Black, tarry-looking stools  For urgent or emergent issues, a gastroenterologist can be reached at any hour by calling [phone]. Do not  use MyChart messaging for urgent concerns.    DIET:  We do recommend a small meal at first, but then you may proceed to your regular diet.  Drink plenty of fluids but you should avoid alcoholic beverages for 24 hours.  ACTIVITY:  You should plan to take it easy for the rest of today and you should NOT DRIVE or use heavy machinery until tomorrow (because of the sedation medicines used during the test).    FOLLOW UP: Our staff will call the number listed on your records the next business day following your procedure.  We will call around 7:15- 8:00 am to check on you and address any questions or concerns that you may have regarding the information given to you following your procedure. If we do not reach you, we will leave a message.       SIGNATURES/CONFIDENTIALITY: You and/or your care partner have signed paperwork which will be entered into your electronic medical record.  These signatures attest to the fact that that the information above on your After Visit Summary has been reviewed and is understood.  Full responsibility of the confidentiality of this discharge information lies with you and/or your care-partner.

## 2023-07-26 NOTE — Progress Notes (Signed)
Report to PACU, RN, vss, BBS= Clear.  

## 2023-07-29 ENCOUNTER — Telehealth: Payer: Self-pay | Admitting: *Deleted

## 2023-07-29 NOTE — Telephone Encounter (Signed)
  Follow up Call-     07/26/2023    2:53 PM  Call back number  Post procedure Call Back phone  # [phone] wifes phone  Permission to leave phone message Yes     Patient questions:  Do you have a fever, pain , or abdominal swelling? No. Pain Score  0 *  Have you tolerated food without any problems? Yes.    Have you been able to return to your normal activities? Yes.    Do you have any questions about your discharge instructions: Diet   No. Medications  No. Follow up visit  No.  Do you have questions or concerns about your Care? No.  Actions: * If pain score is 4 or above: No action needed, pain <4.

## 2023-07-30 ENCOUNTER — Other Ambulatory Visit: Payer: Self-pay | Admitting: Family Medicine

## 2023-07-30 DIAGNOSIS — F411 Generalized anxiety disorder: Secondary | ICD-10-CM

## 2023-09-25 ENCOUNTER — Telehealth: Payer: Self-pay

## 2023-09-25 NOTE — Telephone Encounter (Signed)
 Copied from CRM [id number]. Topic: General - Other >> Sep 25, 2023 11:12 AM Evie Hoff wrote: Reason for CRM: patient wife is calling to speak with nurse or doctor about patient health concern. Patient wife telephone number [phone]

## 2023-09-26 ENCOUNTER — Telehealth: Payer: Self-pay

## 2023-09-26 NOTE — Telephone Encounter (Signed)
Pharmacy Patient Advocate Encounter   Received notification from CoverMyMeds that prior authorization for Pantoprazole 40 mg tablet is required/requested.   Insurance verification completed.   The patient is insured through [facility] .   Per test claim: PA required; PA submitted to above mentioned insurance via CoverMyMeds Key/confirmation #/EOC [id number] Status is pending

## 2023-09-26 NOTE — Telephone Encounter (Signed)
Returned call-  Wife states that patient has seen Dr.G for joint pain and fatigue in the past.  He has an appointment with rheumatology 11/2023.  Wife states that yesterday morning he was having a worse day with worsening arm pain and bilateral leg pain.  Would like to know if there is anything Dr. Reece Agar can do until patient see's rheumatology in march?

## 2023-09-27 NOTE — Telephone Encounter (Signed)
Glad to prescribe something but will have to be seen since last seen >50m ago.

## 2023-09-27 NOTE — Telephone Encounter (Signed)
Reviewed PCP note with patient. Patient wants to know if he can be worked in to see PCP either Monday or Tuesday of next week? He says he can come at anytime. Please advise. Requested that we call spouse with update because sometimes he doesn't have his phone.

## 2023-09-30 NOTE — Telephone Encounter (Signed)
Apt made

## 2023-10-04 ENCOUNTER — Ambulatory Visit (INDEPENDENT_AMBULATORY_CARE_PROVIDER_SITE_OTHER): Payer: 59 | Admitting: Family Medicine

## 2023-10-04 VITALS — BP 132/87 | HR 73 | Temp 97.9°F | Wt 195.2 lb

## 2023-10-04 DIAGNOSIS — E890 Postprocedural hypothyroidism: Secondary | ICD-10-CM | POA: Diagnosis not present

## 2023-10-04 DIAGNOSIS — M130 Polyarthritis, unspecified: Secondary | ICD-10-CM

## 2023-10-04 DIAGNOSIS — R768 Other specified abnormal immunological findings in serum: Secondary | ICD-10-CM

## 2023-10-04 DIAGNOSIS — R5382 Chronic fatigue, unspecified: Secondary | ICD-10-CM

## 2023-10-04 MED ORDER — CELECOXIB 200 MG PO CAPS: 200.0000 mg | ORAL_CAPSULE | Freq: Two times a day (BID) | ORAL | 3 refills | Status: DC

## 2023-10-04 MED ORDER — METHYLPREDNISOLONE ACETATE 80 MG/ML IJ SUSP
80.0000 mg | Freq: Once | INTRAMUSCULAR | Status: AC
  Administered 2023-10-04: 80 mg via INTRAMUSCULAR

## 2023-10-04 MED ORDER — PREDNISONE 10 MG PO TABS: ORAL_TABLET | ORAL | 0 refills | Status: DC

## 2023-10-04 MED ORDER — METHYLPREDNISOLONE ACETATE 80 MG/ML IJ SUSP: 80.0000 mg | Freq: Once | INTRAMUSCULAR | Status: DC

## 2023-10-04 NOTE — Progress Notes (Signed)
Subjective: CC: Polyarthralgia PCP: Raliegh Ip, DO ZOX:WRUEAVW E Kuba is a 58 y.o. male presenting to clinic today for:  1.  polyarthralgia Patient is brought to the office by his wife.  He has been having increasing polyarthralgia.  This is not relieved by Aleve, ibuprofen (only minimally helped), Voltaren gel(lasted only about 45 minutes).  He is on the cancellation list for rheumatology but still has not unable to secure an earlier appointment in March.  He notes that he is constantly fatigued.  Often falling asleep.  He is finding it harder and harder to work and he just wants to get back to his work.   ROS: Per HPI  No Known Allergies Past Medical History:  Diagnosis Date   Anxiety    Complication of anesthesia    combative waking up after back surgery   DDD (degenerative disc disease), lumbar    Depression    Hyperlipidemia    Hypertension    Hypothyroidism     Current Outpatient Medications:    ALPRAZolam (XANAX) 1 MG tablet, Take 0.5-1 tablets (0.5-1 mg total) by mouth 2 (two) times daily as needed for anxiety (place on hold)., Disp: 60 tablet, Rfl: 2   desvenlafaxine (PRISTIQ) 50 MG 24 hr tablet, Take 1 tablet (50 mg total) by mouth daily., Disp: 90 tablet, Rfl: 3   fenofibrate 160 MG tablet, TAKE ONE TABLET BY MOUTH EVERY DAY, Disp: 90 tablet, Rfl: 3   hydrocortisone (ANUSOL-HC) 2.5 % rectal cream, PLACE 1 APPLICATORFUL IN RECTUM 2 TIMES A DAY AS NEEDED FOR HEMORRHOIDS, Disp: 30 g, Rfl: 0   ketoconazole (NIZORAL) 2 % cream, Apply 1 Application topically daily as needed for irritation., Disp: , Rfl:    levothyroxine (SYNTHROID) 137 MCG tablet, Take 1 tablet daily except Saturday and Sunday, Disp: 90 tablet, Rfl: 3   levothyroxine (SYNTHROID) 150 MCG tablet, Take 1 tablet Sat/ Sun, Disp: 30 tablet, Rfl: 3   losartan-hydrochlorothiazide (HYZAAR) 50-12.5 MG tablet, Take 1 tablet by mouth daily., Disp: 90 tablet, Rfl: 3   Na Sulfate-K Sulfate-Mg Sulf (SUPREP BOWEL  PREP KIT) 17.5-3.13-1.6 GM/177ML SOLN, Take 1 kit by mouth as directed. For colonoscopy prep, Disp: 354 mL, Rfl: 0   naproxen (NAPROSYN) 500 MG tablet, TAKE 1 TABLET 2 TIMES A DAY AS NEEDED FOR MODERATE PAIN, Disp: 60 tablet, Rfl: 6   pantoprazole (PROTONIX) 40 MG tablet, Take 1 tablet (40 mg total) by mouth daily., Disp: 30 tablet, Rfl: 11   rosuvastatin (CRESTOR) 40 MG tablet, Take 1 tablet (40 mg total) by mouth daily., Disp: 90 tablet, Rfl: 3   zolpidem (AMBIEN) 10 MG tablet, Take 0.5-1 tablets (5-10 mg total) by mouth at bedtime as needed for sleep (put on file)., Disp: 30 tablet, Rfl: 5 Social History   Socioeconomic History   Marital status: Married    Spouse name: Not on file   Number of children: 4   Years of education: Not on file   Highest education level: Not on file  Occupational History   Occupation: self employeed  Tobacco Use   Smoking status: Never   Smokeless tobacco: Never  Vaping Use   Vaping status: Never Used  Substance and Sexual Activity   Alcohol use: No   Drug use: No   Sexual activity: Not on file  Other Topics Concern   Not on file  Social History Narrative   Not on file   Social Drivers of Health   Financial Resource Strain: Not on file  Food  Insecurity: Not on file  Transportation Needs: Not on file  Physical Activity: Not on file  Stress: Not on file  Social Connections: Not on file  Intimate Partner Violence: Not on file   Family History  Problem Relation Age of Onset   Diabetes Mother    Hypertension Mother    Stroke Mother    Hypertrophic cardiomyopathy Mother    Hyperlipidemia Father    Hypertension Father    Heart attack Father    Diabetes Father    Colon cancer Neg Hx    Esophageal cancer Neg Hx    Stomach cancer Neg Hx     Objective: Office vital signs reviewed. BP 132/87   Pulse 73   Temp 97.9 F (36.6 C)   Wt 195 lb 3.2 oz (88.5 kg)   SpO2 99%   BMI 27.22 kg/m   Physical Examination:  General: Awake, alert, well  nourished, No acute distress HEENT: sclera white, MMM.  No exophthalmos  MSK: Antalgic gait.  No gross inflammation, erythema or warmth of joints appreciated.  No deformity  Assessment/ Plan: 58 y.o. male   Polyarthritis - Plan: methylPREDNISolone acetate (DEPO-MEDROL) injection 80 mg, predniSONE (DELTASONE) 10 MG tablet, celecoxib (CELEBREX) 200 MG capsule, methylPREDNISolone acetate (DEPO-MEDROL) injection 80 mg  Positive ANA (antinuclear antibody) - Plan: methylPREDNISolone acetate (DEPO-MEDROL) injection 80 mg, predniSONE (DELTASONE) 10 MG tablet, celecoxib (CELEBREX) 200 MG capsule, methylPREDNISolone acetate (DEPO-MEDROL) injection 80 mg  Postoperative hypothyroidism - Plan: TSH + free T4  Chronic fatigue - Plan: CBC, TSH + free T4  He clearly has some type of autoimmune disease with positive ANA and polyarthralgia.  Sadly still waiting on rheumatology evaluation.  Discussed scarcity of this specialist in our area.  He is on a cancellation list.  I am going to treat him with corticosteroids for the next 2 weeks and then we will have him transition over to Celebrex.  Discussed avoidance of NSAIDs.  Recheck thyroid levels.  Check CBC   Kennya Schwenn Hulen Skains, DO [facility] [phone]

## 2023-10-04 NOTE — Addendum Note (Signed)
Addended by: Waynette Buttery on: 10/04/2023 11:22 AM   Modules accepted: Orders

## 2023-10-05 LAB — CBC
Hematocrit: 46.6 % (ref 37.5–51.0)
Hemoglobin: 16.1 g/dL (ref 13.0–17.7)
MCH: 29.8 pg (ref 26.6–33.0)
MCHC: 34.5 g/dL (ref 31.5–35.7)
MCV: 86 fL (ref 79–97)
Platelets: 346 10*3/uL (ref 150–450)
RBC: 5.4 x10E6/uL (ref 4.14–5.80)
RDW: 12.4 % (ref 11.6–15.4)
WBC: 9.4 10*3/uL (ref 3.4–10.8)

## 2023-10-05 LAB — TSH+FREE T4
Free T4: 1.86 ng/dL — ABNORMAL HIGH (ref 0.82–1.77)
TSH: 1.99 u[IU]/mL (ref 0.450–4.500)

## 2023-10-07 ENCOUNTER — Encounter: Payer: Self-pay | Admitting: Family Medicine

## 2023-10-07 ENCOUNTER — Other Ambulatory Visit: Payer: Self-pay | Admitting: Family Medicine

## 2023-10-07 ENCOUNTER — Other Ambulatory Visit: Payer: Self-pay

## 2023-10-07 DIAGNOSIS — E890 Postprocedural hypothyroidism: Secondary | ICD-10-CM

## 2023-10-09 ENCOUNTER — Encounter: Payer: Self-pay | Admitting: Family Medicine

## 2023-10-22 ENCOUNTER — Ambulatory Visit: Payer: 59 | Admitting: Family Medicine

## 2023-10-23 NOTE — Telephone Encounter (Signed)
Pharmacy Patient Advocate Encounter  Received notification from [facility] that Prior Authorization for Pantoprazole 40 mg tablets has been APPROVED from 09/26/2023 to 09/25/2024

## 2023-10-26 ENCOUNTER — Other Ambulatory Visit: Payer: Self-pay | Admitting: Family Medicine

## 2023-10-26 DIAGNOSIS — E890 Postprocedural hypothyroidism: Secondary | ICD-10-CM

## 2023-10-26 DIAGNOSIS — F5101 Primary insomnia: Secondary | ICD-10-CM

## 2023-11-04 ENCOUNTER — Other Ambulatory Visit: Payer: 59

## 2023-11-04 DIAGNOSIS — E890 Postprocedural hypothyroidism: Secondary | ICD-10-CM

## 2023-11-05 ENCOUNTER — Other Ambulatory Visit: Payer: Self-pay | Admitting: Family Medicine

## 2023-11-05 ENCOUNTER — Encounter: Payer: Self-pay | Admitting: Family Medicine

## 2023-11-05 DIAGNOSIS — F411 Generalized anxiety disorder: Secondary | ICD-10-CM

## 2023-11-05 DIAGNOSIS — E890 Postprocedural hypothyroidism: Secondary | ICD-10-CM

## 2023-11-05 LAB — TSH+FREE T4
Free T4: 2.12 ng/dL — ABNORMAL HIGH (ref 0.82–1.77)
TSH: 0.7 u[IU]/mL (ref 0.450–4.500)

## 2023-11-05 MED ORDER — ALPRAZOLAM 1 MG PO TABS: 0.5000 mg | ORAL_TABLET | Freq: Two times a day (BID) | ORAL | 2 refills | Status: DC | PRN

## 2023-11-21 NOTE — Progress Notes (Signed)
 Office Visit Note  Patient: Kenneth [name]             Date of Birth: 1965/12/09           MRN: [id number]             PCP: Raliegh Ip, DO Referring: Raliegh Ip, DO Visit Date: 12/05/2023 Occupation: @GUAROCC @  Subjective:  Pain in joints and muscles   History of Present Illness: Kenneth [name] is a 58 y.o. male seen in consultation per request of his PCP for the evaluation of positive ANA, myalgias and arthralgias.  Patient states that 15 years ago he was riding bike and also used to be very active at the time he started having lower back pain.  He underwent discectomy.  The symptoms recurred few years later while he was lifting some heavy objects.  He had another discectomy few years later.  He states the lower back pain improved but he had residual right-sided radiculopathy since then.  He states for the last 5 years he has been having increasing stiffness which has been gradually getting worse.  He has to take hot shower every morning because of stiffness.  He has occasional discomfort in his cervical and thoracic spine.  And also has ongoing right-sided sciatica.  He has some discomfort in her hands without any swelling.  He states in January 2025 he woke up 1 day with increasing stiffness and pain all over to the point he was having difficulty getting out of bed and also getting up from the chair.  The episode lasted for about 3 to 4 days.  He states all of his muscles were sore especially around his shoulders and his hips.  He was seen by his PCP and was given Depo-Medrol injection and prednisone taper for 12 days.  It was followed by Celebrex.  He states the symptoms are gradually coming back but not as severe.  He continues to have some achiness in his muscles.  He denies any difficulty walking.  He still has some difficulty getting up from the chair which she is not sure if is due to sciatica as well.  He was found to have elevated LFTs and had colonoscopy recently.   In October 2024 he had elevated LFTs and the labs showed positive ANA.  He states he also had a rash on his face for which she was seen by dermatologist which was treated with antifungal and steroid medications.  He gives history of fatigue, sicca symptoms, oral ulcers.  There is no history of Raynauds, lymphadenopathy or photosensitivity.  There is no history of inflammatory arthritis.  There is no family history of autoimmune disease.  He is left-handed.  He does Programmer, applications.  He is married and has 2 biological children and total 4 children.  He does not drink any alcohol and does not smoke.  He was accompanied by his wife Crystal today.  She was also concerned about his memory loss.    Activities of Daily Living:  Patient reports morning stiffness for 2 hours.   Patient Denies nocturnal pain.  Difficulty dressing/grooming: Denies Difficulty climbing stairs: Denies Difficulty getting out of chair: Reports Difficulty using hands for taps, buttons, cutlery, and/or writing: Reports  Review of Systems  Constitutional:  Positive for fatigue.  HENT:  Positive for mouth sores and mouth dryness.   Eyes:  Positive for dryness.  Respiratory:  Negative for shortness of breath.   Cardiovascular:  Negative for chest pain and  palpitations.  Gastrointestinal:  Negative for blood in stool, constipation and diarrhea.  Endocrine: Negative for increased urination.  Genitourinary:  Negative for involuntary urination.  Musculoskeletal:  Positive for joint pain, joint pain, myalgias, muscle weakness, morning stiffness, muscle tenderness and myalgias. Negative for gait problem and joint swelling.  Skin:  Positive for rash. Negative for color change, hair loss and sensitivity to sunlight.  Allergic/Immunologic: Positive for susceptible to infections.  Neurological:  Negative for dizziness and headaches.  Hematological:  Negative for swollen glands.  Psychiatric/Behavioral:  Positive for depressed mood and sleep  disturbance. The patient is nervous/anxious.     PMFS History:  Patient Active Problem List   Diagnosis Date Noted   Epidermoid cyst    Educated about COVID-19 virus infection 05/03/2020   Stenosis of carotid artery 05/03/2020   Essential hypertension 03/08/2015   GAD (generalized anxiety disorder) 01/28/2014   Depression 01/28/2014   Insomnia 01/28/2014   Hyperlipidemia 01/26/2013   Hypothyroidism 01/26/2013   DDD (degenerative disc disease), lumbar 01/26/2013    Past Medical History:  Diagnosis Date   Anxiety    Complication of anesthesia    combative waking up after back surgery   DDD (degenerative disc disease), lumbar    Depression    Hyperlipidemia    Hypertension    Hypothyroidism     Family History  Problem Relation Age of Onset   Diabetes Mother    Hypertension Mother    Stroke Mother    Hypertrophic cardiomyopathy Mother    Hyperlipidemia Father    Hypertension Father    Heart attack Father    Diabetes Father    Colon cancer Neg Hx    Esophageal cancer Neg Hx    Stomach cancer Neg Hx    Past Surgical History:  Procedure Laterality Date   BACK SURGERY     disectomy   MASS EXCISION Left 01/06/2021   Procedure: EXCISION, CYST, BUTTOCK;  Surgeon: Franky Macho, MD;  Location: [facility];  Service: General;  Laterality: Left;   THYROIDECTOMY  2010   Social History   Social History Narrative   Not on file   Immunization History  Administered Date(s) Administered   Influenza Split 06/18/2013   Influenza,inj,Quad PF,6+ Mos 06/05/2016, 06/11/2017, 08/22/2018, 06/23/2021, 07/17/2022   Influenza,inj,quad, With Preservative 06/08/2019   Influenza-Unspecified 08/02/2014, 08/15/2015, 06/08/2019   Moderna Sars-Covid-2 Vaccination 01/04/2020, 02/01/2020, 08/30/2020, 04/11/2021, 11/28/2021   Tdap 09/19/2022   Zoster Recombinant(Shingrix) 04/25/2021, 06/23/2021     Objective: Vital Signs: BP 134/82 (BP Location: Left Arm, Patient Position: Sitting, Cuff Size:  Normal)   Pulse 70   Resp 14   Ht 5' 10.5" (1.791 m)   Wt 191 lb (86.6 kg)   BMI 27.02 kg/m    Physical Exam Vitals and nursing note reviewed.  Constitutional:      Appearance: He is well-developed.  HENT:     Head: Normocephalic and atraumatic.  Eyes:     Conjunctiva/sclera: Conjunctivae normal.     Pupils: Pupils are equal, round, and reactive to light.  Cardiovascular:     Rate and Rhythm: Normal rate and regular rhythm.     Heart sounds: Normal heart sounds.  Pulmonary:     Effort: Pulmonary effort is normal.     Breath sounds: Normal breath sounds.  Abdominal:     General: Bowel sounds are normal.     Palpations: Abdomen is soft.  Musculoskeletal:     Cervical back: Normal range of motion and neck supple.  Skin:  General: Skin is warm and dry.     Capillary Refill: Capillary refill takes less than 2 seconds.  Neurological:     Mental Status: He is alert and oriented to person, place, and time.     Comments: Right lower extremity weakness.  Psychiatric:        Behavior: Behavior normal.      Musculoskeletal Exam: Cervical spine was in good range of motion.  He had limited range of motion of thoracic and lumbar spine due to discomfort.  Shoulders, elbows, wrist joints, MCPs PIPs and DIPs Juengel range of motion.  He had discomfort range of motion of his left shoulder joint.  He had no weakness in the upper extremities.  Bilateral PIP and DIP thickening was noted in his hands.  Hip joints and knee joints were in good range of motion without any warmth swelling or effusion.  There was no tenderness over ankles or MTPs.  He had difficulty getting up from the chair due to lower back pain and lower extremity weakness.  CDAI Exam: CDAI Score: -- Patient Global: --; Provider Global: -- Swollen: --; Tender: -- Joint Exam 12/05/2023   No joint exam has been documented for this visit   There is currently no information documented on the homunculus. Go to the Rheumatology  activity and complete the homunculus joint exam.  Investigation: No additional findings.  Imaging: XR Hand 2 View Left Result Date: 12/05/2023 CMC, PIP and DIP narrowing was noted.  No MCP, intercarpal radiocarpal joint space narrowing was noted.  No erosive changes were noted. Impression: These findings were suggestive of osteoarthritis of the hand.  XR Hand 2 View Right Result Date: 12/05/2023 CMC, PIP and DIP narrowing was noted.  No MCP, intercarpal radiocarpal joint space narrowing was noted.  No erosive changes were noted. Impression: These findings were suggestive of osteoarthritis of the hand.   Recent Labs: Lab Results  Component Value Date   WBC 9.4 10/04/2023   HGB 16.1 10/04/2023   PLT 346 10/04/2023   NA 139 04/19/2023   K 4.3 04/19/2023   CL 100 04/19/2023   CO2 23 04/19/2023   GLUCOSE 109 (H) 04/19/2023   BUN 15 04/19/2023   CREATININE 1.19 04/19/2023   BILITOT 0.5 06/25/2023   ALKPHOS 58 06/25/2023   AST 30 06/25/2023   ALT 41 06/25/2023   PROT 7.4 06/25/2023   ALBUMIN 4.5 06/25/2023   CALCIUM 9.8 04/19/2023   GFRAA 86 08/26/2020   June 25, 2023 AST and ALT normal, iron studies normal, ANA 1: 320 NS, antimitochondrial antibody negative, antitrypsin antibody negative, anti-smooth muscle antibody negative, cytoplasmic normal, anti-tTG antibody negative  Speciality Comments: No specialty comments available.  Procedures:  No procedures performed Allergies: Patient has no known allergies.   Assessment / Plan:     Visit Diagnoses: Positive ANA (antinuclear antibody) -patient was found to have positive ANA when he had workup for elevated LFTs.  He gives history of fatigue, dry mouth, dry eyes, oral ulcers, joint pain, myalgias.  There is no history of Raynaud's, inflammatory arthritis, lymphadenopathy or photosensitivity.  Plan: Protein / creatinine ratio, urine, Rheumatoid factor, Cyclic citrul peptide antibody, IgG, ANA, Anti-scleroderma antibody, RNP  Antibody, Anti-Smith antibody, Sjogrens syndrome-A extractable nuclear antibody, Sjogrens syndrome-B extractable nuclear antibody, Anti-DNA antibody, double-stranded, C3 and C4, Beta-2 glycoprotein antibodies, Cardiolipin antibodies, IgG, IgM, IgA  Sicca syndrome (HCC)-he states dry mouth and dry eyes has been more recent.  It could be related to the medication use.  Polyarthralgia -he  has been experiencing progressively increasing stiffness over the last 5 years.  He states he presented with increase muscle pain and fatigue in January 2025 to his PCP.  At the time he was given Depo-Medrol, prednisone taper for 12 days and started on Celebrex.  His symptoms improved after being on the combination.  He continues to be on Celebrex twice a day otherwise he feels discomfort.  He states at the time of the episode he was having difficulty getting up from the chair and also even putting on his socks.  The symptoms improved after being on prednisone.  He continues to have some muscle aches in his legs.  And some discomfort in his left shoulder.  I discussed possibility of polymyalgia rheumatica.  I do not have CK or sedimentation rate.  I will obtain labs today.  01/24/ 2025 Depo-Medrol 80 mg intramuscular, Celebrex 200 mg, prednisone taper - Plan: Sedimentation rate, C-reactive protein  Pain in both hands -he gives history of intermittent swelling in his splints.  No synovitis was noted.  Bilateral PIP and DIP thickening was noted.  Plan: XR Hand 2 View Right, XR Hand 2 View Left.  X-rays of bilateral hands were suggestive of osteoarthritis.  Myalgia -he continues to have some muscle aches in his lower extremities.  He states some of the muscle pain is related to previous lumbar spine surgery and right-sided radiculopathy.  He had difficulty getting up from the chair which she contributes to the muscle aches in his lower extremities and the lower back discomfort.  Plan: CK, Aldolase  Other fatigue -he gives  history of fatigue for last several years.  Plan: CBC with Differential/Platelet, Comprehensive metabolic panel with GFR, Serum protein electrophoresis with reflex  Degeneration of intervertebral disc of lumbar region with discogenic back pain and lower extremity pain -patient had discectomy x 2.  He continues to have lower back pain and right-sided residual weakness.  Elevated LFTs - Intermittent and mild.  Evaluated by Dr. Jonny Ruiz.  Last LFTs were normal in October 2024.  Gastroesophageal reflux disease without esophagitis - On pantoprazole 40 mg daily.  Endoscopy and colonoscopy  Vitamin D deficiency -history of vitamin D deficiency in the past.  Plan: VITAMIN D 25 Hydroxy (Vit-D Deficiency, Fractures)  Essential hypertension -blood pressure was 129/86 today.  On losartan/HCTZ  Mixed hyperlipidemia - Crestor 40 mg p.o. daily  Postoperative hypothyroidism-patient states that the thyroid biopsy showed Hurthle cells for that reason he had thyroidectomy.  Anxiety and depression -controlled on on Xanax and Pristiq  Primary insomnia history of chronic insomnia Ambien 10 mg p.o. nightly  Memory loss-his wife is concerned about memory loss.  I will refer him to neurology for the evaluation of memory loss, lower back pain and right-sided radiculopathy.  He also complains of right lower extremity weakness.  Orders: Orders Placed This Encounter  Procedures   XR Hand 2 View Right   XR Hand 2 View Left   CBC with Differential/Platelet   Comprehensive metabolic panel with GFR   Sedimentation rate   C-reactive protein   CK   VITAMIN D 25 Hydroxy (Vit-D Deficiency, Fractures)   Protein / creatinine ratio, urine   Rheumatoid factor   Cyclic citrul peptide antibody, IgG   ANA   Anti-scleroderma antibody   RNP Antibody   Anti-Smith antibody   Sjogrens syndrome-A extractable nuclear antibody   Sjogrens syndrome-B extractable nuclear antibody   Anti-DNA antibody, double-stranded   C3 and C4    Beta-2 glycoprotein antibodies  Cardiolipin antibodies, IgG, IgM, IgA   Serum protein electrophoresis with reflex   Aldolase   Ambulatory referral to Neurology   No orders of the defined types were placed in this encounter.   Face-to-face time spent with patient was 60 minutes. Greater than 50% of time was spent in counseling and coordination of care.  Follow-Up Instructions: Return for Polyarthralgia, myalgia.   Pollyann Savoy, MD  Note - This record has been created using Animal nutritionist.  Chart creation errors have been sought, but may not always  have been located. Such creation errors do not reflect on  the standard of medical care.

## 2023-12-05 ENCOUNTER — Ambulatory Visit: Payer: 59 | Attending: Rheumatology | Admitting: Rheumatology

## 2023-12-05 ENCOUNTER — Encounter: Payer: Self-pay | Admitting: Rheumatology

## 2023-12-05 ENCOUNTER — Ambulatory Visit (INDEPENDENT_AMBULATORY_CARE_PROVIDER_SITE_OTHER)

## 2023-12-05 ENCOUNTER — Ambulatory Visit

## 2023-12-05 VITALS — BP 134/82 | HR 70 | Resp 14 | Ht 70.5 in | Wt 191.0 lb

## 2023-12-05 DIAGNOSIS — R413 Other amnesia: Secondary | ICD-10-CM

## 2023-12-05 DIAGNOSIS — R5383 Other fatigue: Secondary | ICD-10-CM

## 2023-12-05 DIAGNOSIS — I10 Essential (primary) hypertension: Secondary | ICD-10-CM | POA: Diagnosis not present

## 2023-12-05 DIAGNOSIS — M79642 Pain in left hand: Secondary | ICD-10-CM | POA: Diagnosis not present

## 2023-12-05 DIAGNOSIS — R7989 Other specified abnormal findings of blood chemistry: Secondary | ICD-10-CM | POA: Diagnosis not present

## 2023-12-05 DIAGNOSIS — K219 Gastro-esophageal reflux disease without esophagitis: Secondary | ICD-10-CM

## 2023-12-05 DIAGNOSIS — E890 Postprocedural hypothyroidism: Secondary | ICD-10-CM

## 2023-12-05 DIAGNOSIS — M79641 Pain in right hand: Secondary | ICD-10-CM

## 2023-12-05 DIAGNOSIS — E559 Vitamin D deficiency, unspecified: Secondary | ICD-10-CM | POA: Diagnosis not present

## 2023-12-05 DIAGNOSIS — R768 Other specified abnormal immunological findings in serum: Secondary | ICD-10-CM

## 2023-12-05 DIAGNOSIS — M2550 Pain in unspecified joint: Secondary | ICD-10-CM | POA: Diagnosis not present

## 2023-12-05 DIAGNOSIS — M51369 Other intervertebral disc degeneration, lumbar region without mention of lumbar back pain or lower extremity pain: Secondary | ICD-10-CM

## 2023-12-05 DIAGNOSIS — E782 Mixed hyperlipidemia: Secondary | ICD-10-CM

## 2023-12-05 DIAGNOSIS — F419 Anxiety disorder, unspecified: Secondary | ICD-10-CM

## 2023-12-05 DIAGNOSIS — M3500 Sicca syndrome, unspecified: Secondary | ICD-10-CM | POA: Diagnosis not present

## 2023-12-05 DIAGNOSIS — M51362 Other intervertebral disc degeneration, lumbar region with discogenic back pain and lower extremity pain: Secondary | ICD-10-CM

## 2023-12-05 DIAGNOSIS — M791 Myalgia, unspecified site: Secondary | ICD-10-CM

## 2023-12-05 DIAGNOSIS — F32A Depression, unspecified: Secondary | ICD-10-CM

## 2023-12-05 DIAGNOSIS — F5101 Primary insomnia: Secondary | ICD-10-CM

## 2023-12-06 NOTE — Progress Notes (Signed)
Vitamin D is low.  Patient should take vitamin D 2000 units daily.

## 2023-12-10 LAB — PROTEIN / CREATININE RATIO, URINE
Creatinine, Urine: 31 mg/dL (ref 20–320)
Total Protein, Urine: <4 mg/dL — ABNORMAL LOW (ref 5–25)

## 2023-12-10 LAB — COMPREHENSIVE METABOLIC PANEL WITH GFR
AG Ratio: 2.1 (calc) (ref 1.0–2.5)
ALT: 46 U/L (ref 9–46)
AST: 39 U/L — ABNORMAL HIGH (ref 10–35)
Albumin: 4.9 g/dL (ref 3.6–5.1)
Alkaline phosphatase (APISO): 67 U/L (ref 35–144)
BUN: 13 mg/dL (ref 7–25)
CO2: 28 mmol/L (ref 20–32)
Calcium: 9.5 mg/dL (ref 8.6–10.3)
Chloride: 101 mmol/L (ref 98–110)
Creat: 1.08 mg/dL (ref 0.70–1.30)
Globulin: 2.3 g/dL (ref 1.9–3.7)
Glucose, Bld: 95 mg/dL (ref 65–99)
Potassium: 3.6 mmol/L (ref 3.5–5.3)
Sodium: 139 mmol/L (ref 135–146)
Total Bilirubin: 0.9 mg/dL (ref 0.2–1.2)
Total Protein: 7.2 g/dL (ref 6.1–8.1)
eGFR: 81 mL/min/{1.73_m2} (ref >=60)

## 2023-12-10 LAB — ALDOLASE: Aldolase: 6.5 U/L (ref <=8.1)

## 2023-12-10 LAB — RNP ANTIBODY: Ribonucleic Protein(ENA) Antibody, IgG: <1 AI

## 2023-12-10 LAB — ANTI-NUCLEAR AB-TITER (ANA TITER)
ANA TITER: 1:160 {titer} — ABNORMAL HIGH
ANA TITER: 1:80 {titer} — ABNORMAL HIGH
ANA Titer 1: 1:80 {titer} — ABNORMAL HIGH

## 2023-12-10 LAB — BETA-2 GLYCOPROTEIN ANTIBODIES
Beta-2 Glyco 1 IgA: <2 U/mL (ref <20.0)
Beta-2 Glyco 1 IgM: <2 U/mL (ref <20.0)
Beta-2 Glyco I IgG: <2 U/mL (ref <20.0)

## 2023-12-10 LAB — SJOGRENS SYNDROME-B EXTRACTABLE NUCLEAR ANTIBODY: SSB (La) (ENA) Antibody, IgG: <1 AI

## 2023-12-10 LAB — CBC WITH DIFFERENTIAL/PLATELET
Absolute Lymphocytes: 3045 {cells}/uL (ref 850–3900)
Absolute Monocytes: 740 {cells}/uL (ref 200–950)
Basophils Absolute: 113 {cells}/uL (ref 0–200)
Basophils Relative: 1.3 %
Eosinophils Absolute: 104 {cells}/uL (ref 15–500)
Eosinophils Relative: 1.2 %
HCT: 46.4 % (ref 38.5–50.0)
Hemoglobin: 15.7 g/dL (ref 13.2–17.1)
MCH: 29.4 pg (ref 27.0–33.0)
MCHC: 33.8 g/dL (ref 32.0–36.0)
MCV: 86.9 fL (ref 80.0–100.0)
MPV: 10.7 fL (ref 7.5–12.5)
Monocytes Relative: 8.5 %
Neutro Abs: 4698 {cells}/uL (ref 1500–7800)
Neutrophils Relative %: 54 %
Platelets: 339 10*3/uL (ref 140–400)
RBC: 5.34 10*6/uL (ref 4.20–5.80)
RDW: 13 % (ref 11.0–15.0)
Total Lymphocyte: 35 %
WBC: 8.7 10*3/uL (ref 3.8–10.8)

## 2023-12-10 LAB — C3 AND C4
C3 Complement: 149 mg/dL (ref 82–185)
C4 Complement: 31 mg/dL (ref 15–53)

## 2023-12-10 LAB — PROTEIN ELECTROPHORESIS, SERUM, WITH REFLEX
Albumin ELP: 4.5 g/dL (ref 3.8–4.8)
Alpha 1: 0.3 g/dL (ref 0.2–0.3)
Alpha 2: 0.6 g/dL (ref 0.5–0.9)
Beta 2: 0.4 g/dL (ref 0.2–0.5)
Beta Globulin: 0.5 g/dL (ref 0.4–0.6)
Gamma Globulin: 0.9 g/dL (ref 0.8–1.7)
Total Protein: 7.1 g/dL (ref 6.1–8.1)

## 2023-12-10 LAB — RHEUMATOID FACTOR: Rheumatoid fact SerPl-aCnc: <10 [IU]/mL (ref <14)

## 2023-12-10 LAB — ANA: Anti Nuclear Antibody (ANA): POSITIVE — AB

## 2023-12-10 LAB — ANTI-SMITH ANTIBODY: ENA SM Ab Ser-aCnc: <1 AI

## 2023-12-10 LAB — CYCLIC CITRUL PEPTIDE ANTIBODY, IGG: Cyclic Citrullin Peptide Ab: <16 U

## 2023-12-10 LAB — ANTI-SCLERODERMA ANTIBODY: Scleroderma (Scl-70) (ENA) Antibody, IgG: <1 AI

## 2023-12-10 LAB — VITAMIN D 25 HYDROXY (VIT D DEFICIENCY, FRACTURES): Vit D, 25-Hydroxy: 28 ng/mL — ABNORMAL LOW (ref 30–100)

## 2023-12-10 LAB — CARDIOLIPIN ANTIBODIES, IGG, IGM, IGA
Anticardiolipin IgA: <2 [APL'U]/mL (ref <20.0)
Anticardiolipin IgG: <2 [GPL'U]/mL (ref <20.0)
Anticardiolipin IgM: <2 [MPL'U]/mL (ref <20.0)

## 2023-12-10 LAB — SJOGRENS SYNDROME-A EXTRACTABLE NUCLEAR ANTIBODY: SSA (Ro) (ENA) Antibody, IgG: <1 AI

## 2023-12-10 LAB — ANTI-DNA ANTIBODY, DOUBLE-STRANDED: ds DNA Ab: <1 [IU]/mL

## 2023-12-10 LAB — CK: Total CK: 226 U/L (ref 23–325)

## 2023-12-10 LAB — SEDIMENTATION RATE: Sed Rate: 2 mm/h (ref 0–20)

## 2023-12-10 LAB — C-REACTIVE PROTEIN: CRP: <3 mg/L (ref <8.0)

## 2023-12-10 NOTE — Progress Notes (Signed)
 Liver function is mildly elevated.  Patient should avoid NSAIDs and alcohol use.  ANA is low titer positive.  All other labs are within normal limits.  I will discuss results at the follow-up visit.

## 2023-12-25 NOTE — Progress Notes (Signed)
 Office Visit Note  Patient: Kenneth [name]             Date of Birth: 11/16/1965           MRN: [id number]             PCP: Eliodoro Guerin, DO Referring: Eliodoro Guerin, DO Visit Date: 01/08/2024 Occupation: @GUAROCC @  Subjective:  Left shoulder pain and fatigue  History of Present Illness: Kenneth [name] is a 58 y.o. male with polyarthralgia and positive ANA.  He returns today for follow-up visit.  He states he has noticed some improvement in the joint pain since the last visit.  He states he continues to have pain and discomfort in his left shoulder for the last few months.  He denies any history of injury.  He has difficulty raising his left arm.  He continues to have some pain and stiffness in his hands.  He states the muscle pain is mostly in his lower legs now.  He continues to have fatigue which has been going on for many years.    Activities of Daily Living:  Patient reports morning stiffness for 2 hours.   Patient Reports nocturnal pain.  Difficulty dressing/grooming: Reports Difficulty climbing stairs: Reports Difficulty getting out of chair: Reports Difficulty using hands for taps, buttons, cutlery, and/or writing: Reports  Review of Systems  Constitutional:  Positive for fatigue.  HENT:  Positive for mouth sores. Negative for mouth dryness.   Eyes:  Negative for dryness.  Respiratory:  Negative for shortness of breath.   Cardiovascular:  Negative for chest pain and palpitations.  Gastrointestinal:  Negative for blood in stool, constipation and diarrhea.  Endocrine: Negative for increased urination.  Genitourinary:  Negative for involuntary urination.  Musculoskeletal:  Positive for joint pain, gait problem, joint pain, joint swelling, myalgias, muscle weakness, morning stiffness, muscle tenderness and myalgias.  Skin:  Positive for sensitivity to sunlight. Negative for color change, rash and hair loss.  Allergic/Immunologic: Negative for susceptible to  infections.  Neurological:  Negative for dizziness and headaches.  Hematological:  Negative for swollen glands.  Psychiatric/Behavioral:  Positive for depressed mood and sleep disturbance. The patient is nervous/anxious.     PMFS History:  Patient Active Problem List   Diagnosis Date Noted   Epidermoid cyst    Educated about COVID-19 virus infection 05/03/2020   Stenosis of carotid artery 05/03/2020   Essential hypertension 03/08/2015   GAD (generalized anxiety disorder) 01/28/2014   Depression 01/28/2014   Insomnia 01/28/2014   Hyperlipidemia 01/26/2013   Hypothyroidism 01/26/2013   DDD (degenerative disc disease), lumbar 01/26/2013    Past Medical History:  Diagnosis Date   Anxiety    Complication of anesthesia    combative waking up after back surgery   DDD (degenerative disc disease), lumbar    Depression    Hyperlipidemia    Hypertension    Hypothyroidism     Family History  Problem Relation Age of Onset   Diabetes Mother    Hypertension Mother    Stroke Mother    Hypertrophic cardiomyopathy Mother    Hyperlipidemia Father    Hypertension Father    Heart attack Father    Diabetes Father    Colon cancer Neg Hx    Esophageal cancer Neg Hx    Stomach cancer Neg Hx    Past Surgical History:  Procedure Laterality Date   BACK SURGERY     disectomy   MASS EXCISION Left 01/06/2021   Procedure:  EXCISION, CYST, BUTTOCK;  Surgeon: Alanda Allegra, MD;  Location: [facility];  Service: General;  Laterality: Left;   THYROIDECTOMY  2010   Social History   Social History Narrative   Not on file   Immunization History  Administered Date(s) Administered   Influenza Split 06/18/2013   Influenza,inj,Quad PF,6+ Mos 06/05/2016, 06/11/2017, 08/22/2018, 06/23/2021, 07/17/2022   Influenza,inj,quad, With Preservative 06/08/2019   Influenza-Unspecified 08/02/2014, 08/15/2015, 06/08/2019   Moderna Sars-Covid-2 Vaccination 01/04/2020, 02/01/2020, 08/30/2020, 04/11/2021, 11/28/2021    Tdap 09/19/2022   Zoster Recombinant(Shingrix) 04/25/2021, 06/23/2021     Objective: Vital Signs: BP (!) 146/79 (BP Location: Left Arm, Patient Position: Sitting, Cuff Size: Normal)   Pulse 76   Resp 15   Ht 5\' 10"  (1.778 m)   Wt 191 lb (86.6 kg)   BMI 27.41 kg/m    Physical Exam Vitals and nursing note reviewed.  Constitutional:      Appearance: He is well-developed.  HENT:     Head: Normocephalic and atraumatic.  Eyes:     Conjunctiva/sclera: Conjunctivae normal.     Pupils: Pupils are equal, round, and reactive to light.  Cardiovascular:     Rate and Rhythm: Normal rate and regular rhythm.     Heart sounds: Normal heart sounds.  Pulmonary:     Effort: Pulmonary effort is normal.     Breath sounds: Normal breath sounds.  Abdominal:     General: Bowel sounds are normal.     Palpations: Abdomen is soft.  Musculoskeletal:     Cervical back: Normal range of motion and neck supple.  Skin:    General: Skin is warm and dry.     Capillary Refill: Capillary refill takes less than 2 seconds.  Neurological:     Mental Status: He is alert and oriented to person, place, and time.  Psychiatric:        Behavior: Behavior normal.      Musculoskeletal Exam: Cervical spine was in good range of motion.  He had some discomfort range of motion of thoracic and lumbar spine.  Left shoulder joint abduction and internal rotation was limited and painful.  Right shoulder joints full range of motion.  Elbow joints, wrist joints, MCPs PIPs and DIPs with good range of motion.  Bilateral PIP and DIP thickening was noted.  Hip joints and knee joints with good range of motion without any warmth swelling or effusion.  There was no tenderness over ankles or MTPs.  CDAI Exam: CDAI Score: -- Patient Global: --; Provider Global: -- Swollen: --; Tender: -- Joint Exam 01/08/2024   No joint exam has been documented for this visit   There is currently no information documented on the homunculus. Go to  the Rheumatology activity and complete the homunculus joint exam.  Investigation: No additional findings.  Imaging: XR Shoulder Left Result Date: 01/08/2024 No glenohumeral joint space narrowing was noted.  No acromioclavicular joint space narrowing was noted.  Mild spurring was noted. Impression:  These findings were suggestive of acromioclavicular arthropathy.    Recent Labs: Lab Results  Component Value Date   WBC 8.7 12/05/2023   HGB 15.7 12/05/2023   PLT 339 12/05/2023   NA 139 12/05/2023   K 3.6 12/05/2023   CL 101 12/05/2023   CO2 28 12/05/2023   GLUCOSE 95 12/05/2023   BUN 13 12/05/2023   CREATININE 1.08 12/05/2023   BILITOT 0.9 12/05/2023   ALKPHOS 58 06/25/2023   AST 39 (H) 12/05/2023   ALT 46 12/05/2023   PROT  7.2 12/05/2023   PROT 7.1 12/05/2023   ALBUMIN 4.5 06/25/2023   CALCIUM  9.5 12/05/2023   GFRAA 86 08/26/2020   December 05, 2023 ANA 1: 80 NH, 1: 160 NS, ENA (SCL 70, RNP, Smith, SSA, SSB, dsDNA) negative, C3-C4 normal, beta-2  GP 1 negative, anticardiolipin negative, urine protein creatinine ratio normal, RF negative, anti-CCP negative, CK226, aldolase 6.5, CRP<3.0, ESR 2, SPEP normal, vitamin D  28  Speciality Comments: No specialty comments available.  Procedures:  Large Joint Inj: L glenohumeral on 01/08/2024 1:22 PM Indications: pain Details: 27 G 1.5 in needle, posterior approach  Arthrogram: No  Medications: 1.5 mL lidocaine  1 %; 40 mg triamcinolone acetonide 40 MG/ML Aspirate: 0 mL Outcome: tolerated well, no immediate complications  Risk of infection, tendon injury, nerve injury, dermal atrophy and hypopigmentation were discussed. Procedure, treatment alternatives, risks and benefits explained, specific risks discussed. Consent was given by the patient. Immediately prior to procedure a time out was called to verify the correct patient, procedure, equipment, support staff and site/side marked as required. Patient was prepped and draped in the usual  sterile fashion.     Allergies: Patient has no known allergies.   Assessment / Plan:     Visit Diagnoses: Positive ANA (antinuclear antibody) - December 05, 2023 ANA 1: 80 NH, 1: 160 NS, ENA (SCL 70, RNP, Smith, SSA, SSB, dsDNA) negative, C3-C4 normal, beta-2  GP 1 negative, anticardiolipin negative, urine protein creatinine ratio normal.  Lab results were discussed with the patient and his wife.  He does not have any clinical features of autoimmune disease at this point.  I advised him to contact me if he develops any new symptoms.  I will repeat labs in 6 months.  Sicca syndrome (HCC) - ANA low titer positive, SSA negative, SSB negative.  He has dry mouth and dry eyes most like related to medication use.  Over-the-counter products were discussed.  Polyarthralgia - Increasing stiffness over the last 5 years.Symptoms improved after prednisone  taper and Celebrex .    Sed rate normal, CRP normal.  RF negative, anti-CCP negative.  No synovitis was noted on the examination.  Chronic left shoulder pain -he has been experiencing pain and discomfort in his left shoulder.  He had painful abduction and internal rotation.  After informed consent was obtained and side effects were discussed left shoulder joint was injected with lidocaine  and Kenalog as described above.  Patient tolerated the procedure well.  Postprocedure instructions were given.  Plan: XR Shoulder Left.  No glenohumeral joint space narrowing was noted.  Acromioclavicular spurring was noted.  The findings were suggestive of acromioclavicular arthritis.  A handout on shoulder exercises was given.  Pain in both hands - History of intermittent swelling.  No synovitis was noted on the examination.  Clinical and radiographic findings were suggestive of osteoarthritis.  X-ray findings were reviewed with the patient.  A handout on hand exercises was given.  Myalgia - CK and aldolase normal.  No muscular weakness or tenderness was noted on the examination.   He relates muscle pain in his lower extremities to lumbar spine surgery and right-sided radiculopathy.  Other fatigue - History of fatigue for many years.  CBC, CMP normal except mildly elevated LFTs.  SPEP normal.  Lab results were reviewed.  Degeneration of intervertebral disc of lumbar region with discogenic back pain and lower extremity pain - Discectomy x 2.  Vitamin D  deficiency-vitamin D  was low at 28.  He has been taking vitamin D  supplement now.  Elevated LFTs -  Evaluated by Dr. Autry Legions in the past.  Gastroesophageal reflux disease without esophagitis - On pantoprazole  40 mg daily.  Essential hypertension-blood pressure was elevated at 146/79.  He was advised to monitor blood pressure closely and follow-up with his PCP.  Mixed hyperlipidemia  Postoperative hypothyroidism  Primary insomnia  Anxiety and depression  Memory loss - Referred to neurology.  Orders: Orders Placed This Encounter  Procedures   Large Joint Inj   XR Shoulder Left   No orders of the defined types were placed in this encounter.    Follow-Up Instructions: Return in about 1 year (around 01/07/2025) for Osteoarthritis.   Nicholas Bari, MD  Note - This record has been created using Animal nutritionist.  Chart creation errors have been sought, but may not always  have been located. Such creation errors do not reflect on  the standard of medical care.

## 2024-01-03 ENCOUNTER — Other Ambulatory Visit: Payer: 59

## 2024-01-03 DIAGNOSIS — E890 Postprocedural hypothyroidism: Secondary | ICD-10-CM | POA: Diagnosis not present

## 2024-01-04 LAB — TSH+FREE T4
Free T4: 1.58 ng/dL (ref 0.82–1.77)
TSH: 2.44 u[IU]/mL (ref 0.450–4.500)

## 2024-01-06 ENCOUNTER — Encounter: Payer: Self-pay | Admitting: Family Medicine

## 2024-01-08 ENCOUNTER — Ambulatory Visit: Payer: 59 | Attending: Rheumatology | Admitting: Rheumatology

## 2024-01-08 ENCOUNTER — Encounter: Payer: Self-pay | Admitting: Rheumatology

## 2024-01-08 ENCOUNTER — Ambulatory Visit (INDEPENDENT_AMBULATORY_CARE_PROVIDER_SITE_OTHER)

## 2024-01-08 VITALS — BP 146/79 | HR 76 | Resp 15 | Ht 70.0 in | Wt 191.0 lb

## 2024-01-08 DIAGNOSIS — R7689 Other specified abnormal immunological findings in serum: Secondary | ICD-10-CM

## 2024-01-08 DIAGNOSIS — I10 Essential (primary) hypertension: Secondary | ICD-10-CM | POA: Diagnosis not present

## 2024-01-08 DIAGNOSIS — E782 Mixed hyperlipidemia: Secondary | ICD-10-CM

## 2024-01-08 DIAGNOSIS — M51362 Other intervertebral disc degeneration, lumbar region with discogenic back pain and lower extremity pain: Secondary | ICD-10-CM | POA: Diagnosis not present

## 2024-01-08 DIAGNOSIS — R5383 Other fatigue: Secondary | ICD-10-CM

## 2024-01-08 DIAGNOSIS — M791 Myalgia, unspecified site: Secondary | ICD-10-CM

## 2024-01-08 DIAGNOSIS — M3500 Sicca syndrome, unspecified: Secondary | ICD-10-CM

## 2024-01-08 DIAGNOSIS — M2550 Pain in unspecified joint: Secondary | ICD-10-CM | POA: Diagnosis not present

## 2024-01-08 DIAGNOSIS — G8929 Other chronic pain: Secondary | ICD-10-CM | POA: Diagnosis not present

## 2024-01-08 DIAGNOSIS — M79641 Pain in right hand: Secondary | ICD-10-CM | POA: Diagnosis not present

## 2024-01-08 DIAGNOSIS — F419 Anxiety disorder, unspecified: Secondary | ICD-10-CM

## 2024-01-08 DIAGNOSIS — R413 Other amnesia: Secondary | ICD-10-CM

## 2024-01-08 DIAGNOSIS — K219 Gastro-esophageal reflux disease without esophagitis: Secondary | ICD-10-CM

## 2024-01-08 DIAGNOSIS — E890 Postprocedural hypothyroidism: Secondary | ICD-10-CM

## 2024-01-08 DIAGNOSIS — E559 Vitamin D deficiency, unspecified: Secondary | ICD-10-CM

## 2024-01-08 DIAGNOSIS — M25512 Pain in left shoulder: Secondary | ICD-10-CM

## 2024-01-08 DIAGNOSIS — F32A Depression, unspecified: Secondary | ICD-10-CM

## 2024-01-08 DIAGNOSIS — R7989 Other specified abnormal findings of blood chemistry: Secondary | ICD-10-CM

## 2024-01-08 DIAGNOSIS — F5101 Primary insomnia: Secondary | ICD-10-CM

## 2024-01-08 DIAGNOSIS — R768 Other specified abnormal immunological findings in serum: Secondary | ICD-10-CM | POA: Diagnosis not present

## 2024-01-08 DIAGNOSIS — M79642 Pain in left hand: Secondary | ICD-10-CM

## 2024-01-08 MED ORDER — LIDOCAINE HCL 1 % IJ SOLN
1.5000 mL | INTRAMUSCULAR | Status: AC | PRN
  Administered 2024-01-08: 1.5 mL

## 2024-01-08 MED ORDER — TRIAMCINOLONE ACETONIDE 40 MG/ML IJ SUSP
40.0000 mg | INTRAMUSCULAR | Status: AC | PRN
  Administered 2024-01-08: 40 mg via INTRA_ARTICULAR

## 2024-01-08 NOTE — Patient Instructions (Addendum)
 Please come 1 week prior to your appointment for the lab draw. Standing Labs We placed an order today for your standing lab work.   Please have your standing labs drawn in 6 months  Please have your labs drawn 2 weeks prior to your appointment so that the provider can discuss your lab results at your appointment, if possible.  Please note that you may see your imaging and lab results in MyChart before we have reviewed them. We will contact you once all results are reviewed. Please allow our office up to 72 hours to thoroughly review all of the results before contacting the office for clarification of your results.  WALK-IN LAB HOURS  Monday through Thursday from 8:00 am -12:30 pm and 1:00 pm-5:00 pm and Friday from 8:00 am-12:00 pm.  Patients with office visits requiring labs will be seen before walk-in labs.  You may encounter longer than normal wait times. Please allow additional time. Wait times may be shorter on  Monday and Thursday afternoons.  We do not book appointments for walk-in labs. We appreciate your patience and understanding with our staff.   Labs are drawn by [facility]. Please bring your co-pay at the time of your lab draw.  You may receive a bill from [facility] for your lab work.  Please note if you are on Hydroxychloroquine and and an order has been placed for a Hydroxychloroquine level,  you will need to have it drawn 4 hours or more after your last dose.  If you wish to have your labs drawn at another location, please call the office 24 hours in advance so we can fax the orders.  The office is located at [address], [place], [place] [address]   If you have any questions regarding directions or hours of operation,  please call [phone].   As a reminder, please drink plenty of water prior to coming for your lab work. Thanks!   Hand Exercises Hand exercises can be helpful for almost anyone. They can strengthen your hands and improve flexibility and  movement. The exercises can also increase blood flow to the hands. These results can make your work and daily tasks easier for you. Hand exercises can be especially helpful for people who have joint pain from arthritis or nerve damage from using their hands over and over. These exercises can also help people who injure a hand. Exercises Most of these hand exercises are gentle stretching and motion exercises. It is usually safe to do them often throughout the day. Warming up your hands before exercise may help reduce stiffness. You can do this with gentle massage or by placing your hands in warm water for 10-15 minutes. It is normal to feel some stretching, pulling, tightness, or mild discomfort when you begin new exercises. In time, this will improve. Remember to always be careful and stop right away if you feel sudden, very bad pain or your pain gets worse. You want to get better and be safe. Ask your health care provider which exercises are safe for you. Do exercises exactly as told by your provider and adjust them as told. Do not begin these exercises until told by your provider. Knuckle bend or "claw" fist  Stand or sit with your arm, hand, and all five fingers pointed straight up. Make sure to keep your wrist straight. Gently bend your fingers down toward your palm until the tips of your fingers are touching your palm. Keep your big knuckle straight and only bend the  small knuckles in your fingers. Hold this position for 10 seconds. Straighten your fingers back to your starting position. Repeat this exercise 5-10 times with each hand. Full finger fist  Stand or sit with your arm, hand, and all five fingers pointed straight up. Make sure to keep your wrist straight. Gently bend your fingers into your palm until the tips of your fingers are touching the middle of your palm. Hold this position for 10 seconds. Extend your fingers back to your starting position, stretching every joint  fully. Repeat this exercise 5-10 times with each hand. Straight fist  Stand or sit with your arm, hand, and all five fingers pointed straight up. Make sure to keep your wrist straight. Gently bend your fingers at the big knuckle, where your fingers meet your hand, and at the middle knuckle. Keep the knuckle at the tips of your fingers straight and try to touch the bottom of your palm. Hold this position for 10 seconds. Extend your fingers back to your starting position, stretching every joint fully. Repeat this exercise 5-10 times with each hand. Tabletop  Stand or sit with your arm, hand, and all five fingers pointed straight up. Make sure to keep your wrist straight. Gently bend your fingers at the big knuckle, where your fingers meet your hand, as far down as you can. Keep the small knuckles in your fingers straight. Think of forming a tabletop with your fingers. Hold this position for 10 seconds. Extend your fingers back to your starting position, stretching every joint fully. Repeat this exercise 5-10 times with each hand. Finger spread  Place your hand flat on a table with your palm facing down. Make sure your wrist stays straight. Spread your fingers and thumb apart from each other as far as you can until you feel a gentle stretch. Hold this position for 10 seconds. Bring your fingers and thumb tight together again. Hold this position for 10 seconds. Repeat this exercise 5-10 times with each hand. Making circles  Stand or sit with your arm, hand, and all five fingers pointed straight up. Make sure to keep your wrist straight. Make a circle by touching the tip of your thumb to the tip of your index finger. Hold for 10 seconds. Then open your hand wide. Repeat this motion with your thumb and each of your fingers. Repeat this exercise 5-10 times with each hand. Thumb motion  Sit with your forearm resting on a table and your wrist straight. Your thumb should be facing up toward the  ceiling. Keep your fingers relaxed as you move your thumb. Lift your thumb up as high as you can toward the ceiling. Hold for 10 seconds. Bend your thumb across your palm as far as you can, reaching the tip of your thumb for the small finger (pinkie) side of your palm. Hold for 10 seconds. Repeat this exercise 5-10 times with each hand. Grip strengthening  Hold a stress ball or other soft ball in the middle of your hand. Slowly increase the pressure, squeezing the ball as much as you can without causing pain. Think of bringing the tips of your fingers into the middle of your palm. All of your finger joints should bend when doing this exercise. Hold your squeeze for 10 seconds, then relax. Repeat this exercise 5-10 times with each hand. Contact a health care provider if: Your hand pain or discomfort gets much worse when you do an exercise. Your hand pain or discomfort does not improve within 2  hours after you exercise. If you have either of these problems, stop doing these exercises right away. Do not do them again unless your provider says that you can. Get help right away if: You develop sudden, severe hand pain or swelling. If this happens, stop doing these exercises right away. Do not do them again unless your provider says that you can. This information is not intended to replace advice given to you by your health care provider. Make sure you discuss any questions you have with your health care provider. Document Revised: 09/11/2022 Document Reviewed: 09/11/2022 [employer] Patient Education  2024 [employer] Shoulder Exercises Ask your health care provider which exercises are safe for you. Do exercises exactly as told by your health care provider and adjust them as directed. It is normal to feel mild stretching, pulling, tightness, or discomfort as you do these exercises. Stop right away if you feel sudden pain or your pain gets worse. Do not begin these exercises until told by your health  care provider. Stretching exercises External rotation and abduction This exercise is sometimes called corner stretch. The exercise rotates your arm outward (external rotation) and moves your arm out from your body (abduction). Stand in a doorway with one of your feet slightly in front of the other. This is called a staggered stance. If you cannot reach your forearms to the door frame, stand facing a corner of a room. Choose one of the following positions as told by your health care provider: Place your hands and forearms on the door frame above your head. Place your hands and forearms on the door frame at the height of your head. Place your hands on the door frame at the height of your elbows. Slowly move your weight onto your front foot until you feel a stretch across your chest and in the front of your shoulders. Keep your head and chest upright and keep your abdominal muscles tight. Hold for __________ seconds. To release the stretch, shift your weight to your back foot. Repeat __________ times. Complete this exercise __________ times a day. Extension, standing  Stand and hold a broomstick, a cane, or a similar object behind your back. Your hands should be a little wider than shoulder-width apart. Your palms should face away from your back. Keeping your elbows straight and your shoulder muscles relaxed, move the stick away from your body until you feel a stretch in your shoulders (extension). Avoid shrugging your shoulders while you move the stick. Keep your shoulder blades tucked down toward the middle of your back. Hold for __________ seconds. Slowly return to the starting position. Repeat __________ times. Complete this exercise __________ times a day. Range-of-motion exercises Pendulum  Stand near a wall or a surface that you can hold onto for balance. Bend at the waist and let your left / right arm hang straight down. Use your other arm to support you. Keep your back straight and  do not lock your knees. Relax your left / right arm and shoulder muscles, and move your hips and your trunk so your left / right arm swings freely. Your arm should swing because of the motion of your body, not because you are using your arm or shoulder muscles. Keep moving your hips and trunk so your arm swings in the following directions, as told by your health care provider: Side to side. Forward and backward. In clockwise and counterclockwise circles. Continue each motion for __________ seconds, or for as long as told by your health care  provider. Slowly return to the starting position. Repeat __________ times. Complete this exercise __________ times a day. Shoulder flexion, standing  Stand and hold a broomstick, a cane, or a similar object. Place your hands a little more than shoulder-width apart on the object. Your left / right hand should be palm-up, and your other hand should be palm-down. Keep your elbow straight and your shoulder muscles relaxed. Push the stick up with your healthy arm to raise your left / right arm in front of your body, and then over your head until you feel a stretch in your shoulder (flexion). Avoid shrugging your shoulder while you raise your arm. Keep your shoulder blade tucked down toward the middle of your back. Hold for __________ seconds. Slowly return to the starting position. Repeat __________ times. Complete this exercise __________ times a day. Shoulder abduction, standing  Stand and hold a broomstick, a cane, or a similar object. Place your hands a little more than shoulder-width apart on the object. Your left / right hand should be palm-up, and your other hand should be palm-down. Keep your elbow straight and your shoulder muscles relaxed. Push the object across your body toward your left / right side. Raise your left / right arm to the side of your body (abduction) until you feel a stretch in your shoulder. Do not raise your arm above shoulder height  unless your health care provider tells you to do that. If directed, raise your arm over your head. Avoid shrugging your shoulder while you raise your arm. Keep your shoulder blade tucked down toward the middle of your back. Hold for __________ seconds. Slowly return to the starting position. Repeat __________ times. Complete this exercise __________ times a day. Internal rotation  Place your left / right hand behind your back, palm-up. Use your other hand to dangle an exercise band, a broomstick, or a similar object over your shoulder. Grasp the band with your left / right hand so you are holding on to both ends. Gently pull up on the band until you feel a stretch in the front of your left / right shoulder. The movement of your arm toward the center of your body is called internal rotation. Avoid shrugging your shoulder while you raise your arm. Keep your shoulder blade tucked down toward the middle of your back. Hold for __________ seconds. Release the stretch by letting go of the band and lowering your hands. Repeat __________ times. Complete this exercise __________ times a day. Strengthening exercises External rotation  Sit in a stable chair without armrests. Secure an exercise band to a stable object at elbow height on your left / right side. Place a soft object, such as a folded towel or a small pillow, between your left / right upper arm and your body to move your elbow about 4 inches (10 cm) away from your side. Hold the end of the exercise band so it is tight and there is no slack. Keeping your elbow pressed against the soft object, slowly move your forearm out, away from your abdomen (external rotation). Keep your body steady so only your forearm moves. Hold for __________ seconds. Slowly return to the starting position. Repeat __________ times. Complete this exercise __________ times a day. Shoulder abduction  Sit in a stable chair without armrests, or stand up. Hold a  __________ lb / kg weight in your left / right hand, or hold an exercise band with both hands. Start with your arms straight down and your left /  right palm facing in, toward your body. Slowly lift your left / right hand out to your side (abduction). Do not lift your hand above shoulder height unless your health care provider tells you that this is safe. Keep your arms straight. Avoid shrugging your shoulder while you do this movement. Keep your shoulder blade tucked down toward the middle of your back. Hold for __________ seconds. Slowly lower your arm, and return to the starting position. Repeat __________ times. Complete this exercise __________ times a day. Shoulder extension  Sit in a stable chair without armrests, or stand up. Secure an exercise band to a stable object in front of you so it is at shoulder height. Hold one end of the exercise band in each hand. Straighten your elbows and lift your hands up to shoulder height. Squeeze your shoulder blades together as you pull your hands down to the sides of your thighs (extension). Stop when your hands are straight down by your sides. Do not let your hands go behind your body. Hold for __________ seconds. Slowly return to the starting position. Repeat __________ times. Complete this exercise __________ times a day. Shoulder row  Sit in a stable chair without armrests, or stand up. Secure an exercise band to a stable object in front of you so it is at chest height. Hold one end of the exercise band in each hand. Position your palms so that your thumbs are facing the ceiling (neutral position). Bend each of your elbows to a 90-degree angle (right angle) and keep your upper arms at your sides. Step back or move the chair back until the band is tight and there is no slack. Slowly pull your elbows back behind you. Hold for __________ seconds. Slowly return to the starting position. Repeat __________ times. Complete this exercise __________  times a day. Shoulder press-ups  Sit in a stable chair that has armrests. Sit upright, with your feet flat on the floor. Put your hands on the armrests so your elbows are bent and your fingers are pointing forward. Your hands should be about even with the sides of your body. Push down on the armrests and use your arms to lift yourself off the chair. Straighten your elbows and lift yourself up as much as you comfortably can. Move your shoulder blades down, and avoid letting your shoulders move up toward your ears. Keep your feet on the ground. As you get stronger, your feet should support less of your body weight as you lift yourself up. Hold for __________ seconds. Slowly lower yourself back into the chair. Repeat __________ times. Complete this exercise __________ times a day. Wall push-ups  Stand so you are facing a stable wall. Your feet should be about one arm-length away from the wall. Lean forward and place your palms on the wall at shoulder height. Keep your feet flat on the floor as you bend your elbows and lean forward toward the wall. Hold for __________ seconds. Straighten your elbows to push yourself back to the starting position. Repeat __________ times. Complete this exercise __________ times a day. This information is not intended to replace advice given to you by your health care provider. Make sure you discuss any questions you have with your health care provider. Document Revised: 10/17/2021 Document Reviewed: 10/17/2021 [employer] Patient Education  2024 [employer].

## 2024-01-21 ENCOUNTER — Inpatient Hospital Stay (HOSPITAL_COMMUNITY)
Admission: EM | Admit: 2024-01-21 | Discharge: 2024-01-23 | DRG: 682 | Disposition: A | Discharge status: Home / Self Care | Attending: Cardiology | Admitting: Cardiology

## 2024-01-21 ENCOUNTER — Encounter (HOSPITAL_COMMUNITY): Payer: Self-pay

## 2024-01-21 ENCOUNTER — Other Ambulatory Visit: Payer: Self-pay

## 2024-01-21 ENCOUNTER — Emergency Department (HOSPITAL_COMMUNITY)

## 2024-01-21 DIAGNOSIS — I21A1 Myocardial infarction type 2: Secondary | ICD-10-CM | POA: Diagnosis not present

## 2024-01-21 DIAGNOSIS — F32A Depression, unspecified: Secondary | ICD-10-CM | POA: Diagnosis not present

## 2024-01-21 DIAGNOSIS — I214 Non-ST elevation (NSTEMI) myocardial infarction: Secondary | ICD-10-CM | POA: Diagnosis not present

## 2024-01-21 DIAGNOSIS — Z833 Family history of diabetes mellitus: Secondary | ICD-10-CM | POA: Diagnosis not present

## 2024-01-21 DIAGNOSIS — F410 Panic disorder [episodic paroxysmal anxiety] without agoraphobia: Secondary | ICD-10-CM | POA: Diagnosis not present

## 2024-01-21 DIAGNOSIS — I739 Peripheral vascular disease, unspecified: Secondary | ICD-10-CM | POA: Diagnosis present

## 2024-01-21 DIAGNOSIS — R079 Chest pain, unspecified: Secondary | ICD-10-CM | POA: Diagnosis not present

## 2024-01-21 DIAGNOSIS — E039 Hypothyroidism, unspecified: Secondary | ICD-10-CM | POA: Diagnosis present

## 2024-01-21 DIAGNOSIS — I2510 Atherosclerotic heart disease of native coronary artery without angina pectoris: Secondary | ICD-10-CM | POA: Diagnosis not present

## 2024-01-21 DIAGNOSIS — Z8249 Family history of ischemic heart disease and other diseases of the circulatory system: Secondary | ICD-10-CM | POA: Diagnosis not present

## 2024-01-21 DIAGNOSIS — I10 Essential (primary) hypertension: Secondary | ICD-10-CM | POA: Diagnosis present

## 2024-01-21 DIAGNOSIS — R000 Tachycardia, unspecified: Secondary | ICD-10-CM | POA: Diagnosis not present

## 2024-01-21 DIAGNOSIS — N179 Acute kidney failure, unspecified: Secondary | ICD-10-CM | POA: Diagnosis not present

## 2024-01-21 DIAGNOSIS — Z79899 Other long term (current) drug therapy: Secondary | ICD-10-CM | POA: Diagnosis not present

## 2024-01-21 DIAGNOSIS — Z79890 Hormone replacement therapy (postmenopausal): Secondary | ICD-10-CM

## 2024-01-21 DIAGNOSIS — Z823 Family history of stroke: Secondary | ICD-10-CM

## 2024-01-21 DIAGNOSIS — I200 Unstable angina: Secondary | ICD-10-CM | POA: Diagnosis not present

## 2024-01-21 DIAGNOSIS — R7989 Other specified abnormal findings of blood chemistry: Secondary | ICD-10-CM | POA: Diagnosis not present

## 2024-01-21 DIAGNOSIS — E785 Hyperlipidemia, unspecified: Secondary | ICD-10-CM | POA: Diagnosis not present

## 2024-01-21 DIAGNOSIS — I5030 Unspecified diastolic (congestive) heart failure: Secondary | ICD-10-CM | POA: Diagnosis not present

## 2024-01-21 LAB — CBC WITH DIFFERENTIAL/PLATELET
Abs Immature Granulocytes: 0.04 10*3/uL (ref 0.00–0.07)
Basophils Absolute: 0.1 10*3/uL (ref 0.0–0.1)
Basophils Relative: 1 %
Eosinophils Absolute: 0.1 10*3/uL (ref 0.0–0.5)
Eosinophils Relative: 1 %
HCT: 45.5 % (ref 39.0–52.0)
Hemoglobin: 16.1 g/dL (ref 13.0–17.0)
Immature Granulocytes: 0 %
Lymphocytes Relative: 33 %
Lymphs Abs: 3.5 10*3/uL (ref 0.7–4.0)
MCH: 30.6 pg (ref 26.0–34.0)
MCHC: 35.4 g/dL (ref 30.0–36.0)
MCV: 86.5 fL (ref 80.0–100.0)
Monocytes Absolute: 0.7 10*3/uL (ref 0.1–1.0)
Monocytes Relative: 7 %
Neutro Abs: 6.2 10*3/uL (ref 1.7–7.7)
Neutrophils Relative %: 58 %
Platelets: 359 10*3/uL (ref 150–400)
RBC: 5.26 MIL/uL (ref 4.22–5.81)
RDW: 12.6 % (ref 11.5–15.5)
WBC: 10.6 10*3/uL — ABNORMAL HIGH (ref 4.0–10.5)
nRBC: 0 % (ref 0.0–0.2)

## 2024-01-21 LAB — BASIC METABOLIC PANEL WITH GFR
Anion gap: 12 (ref 5–15)
BUN: 17 mg/dL (ref 6–20)
CO2: 26 mmol/L (ref 22–32)
Calcium: 9.5 mg/dL (ref 8.9–10.3)
Chloride: 102 mmol/L (ref 98–111)
Creatinine, Ser: 1.39 mg/dL — ABNORMAL HIGH (ref 0.61–1.24)
GFR, Estimated: 59 mL/min — ABNORMAL LOW (ref >60)
Glucose, Bld: 91 mg/dL (ref 70–99)
Potassium: 3.6 mmol/L (ref 3.5–5.1)
Sodium: 140 mmol/L (ref 135–145)

## 2024-01-21 LAB — I-STAT CHEM 8, ED
BUN: 20 mg/dL (ref 6–20)
Calcium, Ion: 1.13 mmol/L — ABNORMAL LOW (ref 1.15–1.40)
Chloride: 101 mmol/L (ref 98–111)
Creatinine, Ser: 1.4 mg/dL — ABNORMAL HIGH (ref 0.61–1.24)
Glucose, Bld: 92 mg/dL (ref 70–99)
HCT: 48 % (ref 39.0–52.0)
Hemoglobin: 16.3 g/dL (ref 13.0–17.0)
Potassium: 3.5 mmol/L (ref 3.5–5.1)
Sodium: 140 mmol/L (ref 135–145)
TCO2: 26 mmol/L (ref 22–32)

## 2024-01-21 LAB — PROTIME-INR
INR: 1 (ref 0.8–1.2)
Prothrombin Time: 13.1 s (ref 11.4–15.2)

## 2024-01-21 LAB — TROPONIN I (HIGH SENSITIVITY)
Troponin I (High Sensitivity): 10 ng/L (ref <18)
Troponin I (High Sensitivity): 38 ng/L — ABNORMAL HIGH (ref <18)
Troponin I (High Sensitivity): 81 ng/L — ABNORMAL HIGH (ref <18)
Troponin I (High Sensitivity): 81 ng/L — ABNORMAL HIGH (ref <18)

## 2024-01-21 LAB — APTT: aPTT: 24 s (ref 24–36)

## 2024-01-21 MED ORDER — HEPARIN BOLUS VIA INFUSION
4000.0000 [IU] | Freq: Once | INTRAVENOUS | Status: AC
  Administered 2024-01-21: 4000 [IU] via INTRAVENOUS

## 2024-01-21 MED ORDER — ASPIRIN 81 MG PO CHEW
162.0000 mg | CHEWABLE_TABLET | Freq: Once | ORAL | Status: AC
  Administered 2024-01-21: 162 mg via ORAL
  Filled 2024-01-21: qty 2

## 2024-01-21 MED ORDER — HEPARIN (PORCINE) 25000 UT/250ML-% IV SOLN
1050.0000 [IU]/h | INTRAVENOUS | Status: DC
  Administered 2024-01-21 – 2024-01-22 (×2): 1050 [IU]/h via INTRAVENOUS
  Filled 2024-01-21 (×2): qty 250

## 2024-01-21 NOTE — ED Provider Notes (Signed)
 [place] EMERGENCY DEPARTMENT AT [facility] Provider Note   CSN: [id number] Arrival date & time: 01/21/24  1456     History  Chief Complaint  Patient presents with   Chest Pain    Kenneth [name] is a 58 y.o. male.  Patient with a history of hypertension and hyperlipidemia.  Patient states that he started with chest pain around 1:00 and it lasted for about 90 minutes.  He became short of breath and felt very hot but did not start sweating.  When patient arrived in the emergency department he was no longer in discomfort  The history is provided by the patient and medical records. No language interpreter was used.  Chest Pain Pain location:  L chest Pain quality: aching   Pain radiates to:  Does not radiate Pain severity:  Moderate Onset quality:  Sudden Timing:  Constant Progression:  Resolved Chronicity:  New Context: not breathing   Relieved by:  Nothing Worsened by:  Nothing Ineffective treatments:  None tried Associated symptoms: no abdominal pain, no back pain, no cough, no fatigue and no headache        Home Medications Prior to Admission medications   Medication Sig Start Date End Date Taking? Authorizing Provider  ALPRAZolam  (XANAX ) 1 MG tablet Take 0.5-1 tablets (0.5-1 mg total) by mouth 2 (two) times daily as needed for anxiety (place on hold). 11/05/23   Eliodoro Guerin, DO  celecoxib  (CELEBREX ) 200 MG capsule Take 1 capsule (200 mg total) by mouth 2 (two) times daily. 10/04/23   Eliodoro Guerin, DO  desvenlafaxine  (PRISTIQ ) 50 MG 24 hr tablet Take 1 tablet (50 mg total) by mouth daily. 04/19/23   Eliodoro Guerin, DO  fenofibrate  160 MG tablet TAKE ONE TABLET BY MOUTH EVERY DAY 10/28/23   Vicky Grange M, DO  hydrocortisone  (ANUSOL -HC) 2.5 % rectal cream PLACE 1 APPLICATORFUL IN RECTUM 2 TIMES A DAY AS NEEDED FOR HEMORRHOIDS 05/10/23   Vicky Grange M, DO  ibuprofen (ADVIL) 200 MG tablet Take 200 mg by mouth every 6 (six) hours as  needed.    [provider]  ketoconazole (NIZORAL) 2 % cream Apply 1 Application topically daily as needed for irritation. 05/21/23   [provider]  levothyroxine  (SYNTHROID ) 137 MCG tablet TAKE ONE TABLET DAILY EXCEPT ON SATURDAY AND SUNDAY 10/28/23   Vicky Grange M, DO  losartan -hydrochlorothiazide  (HYZAAR) 50-12.5 MG tablet Take 1 tablet by mouth daily. 04/19/23   Eliodoro Guerin, DO  pantoprazole  (PROTONIX ) 40 MG tablet Take 1 tablet (40 mg total) by mouth daily. 06/25/23   Tobin Forts, MD  rosuvastatin  (CRESTOR ) 40 MG tablet Take 1 tablet (40 mg total) by mouth daily. 04/19/23   Eliodoro Guerin, DO  triamcinolone  (KENALOG ) 0.025 % cream Apply topically daily. 12/17/23   [provider]  zolpidem  (AMBIEN ) 10 MG tablet TAKE ONE-HALF TO 1 TABLET AT BEDTIME AS NEEDED FOR SLEEP 10/28/23   Eliodoro Guerin, DO      Allergies    Patient has no known allergies.    Review of Systems   Review of Systems  Constitutional:  Negative for appetite change and fatigue.  HENT:  Negative for congestion, ear discharge and sinus pressure.   Eyes:  Negative for discharge.  Respiratory:  Negative for cough.   Cardiovascular:  Positive for chest pain.  Gastrointestinal:  Negative for abdominal pain and diarrhea.  Genitourinary:  Negative for frequency and hematuria.  Musculoskeletal:  Negative for back pain.  Skin:  Negative for rash.  Neurological:  Negative for seizures and headaches.  Psychiatric/Behavioral:  Negative for hallucinations.     Physical Exam Updated Vital Signs BP (!) 111/90   Pulse 79   Temp 98.3 F (36.8 C) (Oral)   Resp 15   Ht 5\' 10"  (1.778 m)   Wt 86.6 kg   SpO2 98%   BMI 27.39 kg/m  Physical Exam Vitals and nursing note reviewed.  Constitutional:      Appearance: He is well-developed.  HENT:     Head: Normocephalic.     Nose: Nose normal.  Eyes:     General: No scleral icterus.    Conjunctiva/sclera: Conjunctivae normal.   Neck:     Thyroid : No thyromegaly.  Cardiovascular:     Rate and Rhythm: Normal rate and regular rhythm.     Heart sounds: No murmur heard.    No friction rub. No gallop.  Pulmonary:     Breath sounds: No stridor. No wheezing or rales.  Chest:     Chest wall: No tenderness.  Abdominal:     General: There is no distension.     Tenderness: There is no abdominal tenderness. There is no rebound.  Musculoskeletal:        General: Normal range of motion.     Cervical back: Neck supple.  Lymphadenopathy:     Cervical: No cervical adenopathy.  Skin:    Findings: No erythema or rash.  Neurological:     Mental Status: He is alert and oriented to person, place, and time.     Motor: No abnormal muscle tone.     Coordination: Coordination normal.  Psychiatric:        Behavior: Behavior normal.     ED Results / Procedures / Treatments   Labs (all labs ordered are listed, but only abnormal results are displayed) Labs Reviewed  BASIC METABOLIC PANEL WITH GFR - Abnormal; Notable for the following components:      Result Value   Creatinine, Ser 1.39 (*)    GFR, Estimated 59 (*)    All other components within normal limits  CBC WITH DIFFERENTIAL/PLATELET - Abnormal; Notable for the following components:   WBC 10.6 (*)    All other components within normal limits  I-STAT CHEM 8, ED - Abnormal; Notable for the following components:   Creatinine, Ser 1.40 (*)    Calcium , Ion 1.13 (*)    All other components within normal limits  TROPONIN I (HIGH SENSITIVITY) - Abnormal; Notable for the following components:   Troponin I (High Sensitivity) 38 (*)    All other components within normal limits  TROPONIN I (HIGH SENSITIVITY)    EKG EKG Interpretation Date/Time:  Tuesday Jan 21 2024 15:10:47 EDT Ventricular Rate:  111 PR Interval:  142 QRS Duration:  74 QT Interval:  330 QTC Calculation: 448 R Axis:   67  Text Interpretation: Sinus tachycardia Otherwise normal ECG No previous ECGs  available Confirmed by Cheyenne Cotta [phone]) on 01/21/2024 5:26:10 PM  Radiology DG Chest 2 View Result Date: 01/21/2024 CLINICAL DATA:  chest pain. EXAM: CHEST - 2 VIEW COMPARISON:  None Available. FINDINGS: Bilateral lung fields are clear. Bilateral costophrenic angles are clear. Normal cardio-mediastinal silhouette. No acute osseous abnormalities. The soft tissues are within normal limits. IMPRESSION: No active cardiopulmonary disease. Electronically Signed   By: Beula Brunswick M.D.   On: 01/21/2024 15:28    Procedures Procedures    Medications Ordered in ED Medications  aspirin chewable  tablet 162 mg (has no administration in time range)    ED Course/ Medical Decision Making/ A&P  CRITICAL CARE Performed by: Cheyenne Cotta Total critical care time: 45 minutes Critical care time was exclusive of separately billable procedures and treating other patients. Critical care was necessary to treat or prevent imminent or life-threatening deterioration. Critical care was time spent personally by me on the following activities: development of treatment plan with patient and/or surrogate as well as nursing, discussions with consultants, evaluation of patient's response to treatment, examination of patient, obtaining history from patient or surrogate, ordering and performing treatments and interventions, ordering and review of laboratory studies, ordering and review of radiographic studies, pulse oximetry and re-evaluation of patient's condition.                                    Medical Decision Making Amount and/or Complexity of Data Reviewed Labs: ordered. Radiology: ordered. ECG/medicine tests: ordered.  Risk OTC drugs. Decision regarding hospitalization.   Patient with chest pain and possible NSTEMI.  He will be admitted to cardiac telemetry at [facility]        Final Clinical Impression(s) / ED Diagnoses Final diagnoses:  NSTEMI (non-ST elevated myocardial infarction)  [facility])    Rx / DC Orders ED Discharge Orders     None         Cheyenne Cotta, MD 01/21/24 2055

## 2024-01-21 NOTE — H&P (Incomplete)
 Cardiology Admission History and Physical   Patient ID: Kenneth [name] MRN: [id number]; DOB: 1965-09-12   Admission date: 01/21/2024  PCP:  Eliodoro Guerin, DO   [facility] Providers Cardiologist:  None   { Click here to update MD or APP on Care Team, Refresh:1}     Chief Complaint: Chest pain  Patient Profile:   Kenneth [name] is a 58 y.o. male with hypertension, hyperlipidemia, CKD stage III 3A with EGFR 59 who is being seen 01/21/2024 for the evaluation of chest pain.  History of Present Illness:   Kenneth [name] is a 58 y.o. male with hypertension, hyperlipidemia, CKD stage III 3A with EGFR 59 who is being seen 01/21/2024 for the evaluation of chest pain.  Patient reports he started having chest pain this afternoon lasted for 90 minutes associated with shortness of breath, flushing sensation and came to the ER due to discomfort, took 2 baby aspirin, initial EKG showed mild ST segment depression in inferior and anteriorLateral leads  troponin went up from 10->38 Patient is chest pain-free, being admitted for NSTEMI  ER evaluation included EKG first EKG shows normal sinus rhythm with mild ST depression in inferior and anterolateral leads. Subsequent EKG showsSinus rhythm with nonspecific ST-T wave changes  His lipid panel from 04/2023 LDL 142, TG 150, TC 210 CT coronary calcium  score in 2021 shows calcium  score 0 Echo 2021 showed EF 60 to 65%, RV normal in size and function, no significant valvular dysfunction, grade 1 diastolic dysfunction  Past Medical History:  Diagnosis Date   Anxiety    Complication of anesthesia    combative waking up after back surgery   DDD (degenerative disc disease), lumbar    Depression    Hyperlipidemia    Hypertension    Hypothyroidism     Past Surgical History:  Procedure Laterality Date   BACK SURGERY     disectomy   MASS EXCISION Left 01/06/2021   Procedure: EXCISION, CYST, BUTTOCK;  Surgeon: Alanda Allegra, MD;   Location: [facility];  Service: General;  Laterality: Left;   THYROIDECTOMY  2010     Medications Prior to Admission: Prior to Admission medications   Medication Sig Start Date End Date Taking? Authorizing Provider  ALPRAZolam  (XANAX ) 1 MG tablet Take 0.5-1 tablets (0.5-1 mg total) by mouth 2 (two) times daily as needed for anxiety (place on hold). 11/05/23   Eliodoro Guerin, DO  celecoxib  (CELEBREX ) 200 MG capsule Take 1 capsule (200 mg total) by mouth 2 (two) times daily. 10/04/23   Eliodoro Guerin, DO  desvenlafaxine  (PRISTIQ ) 50 MG 24 hr tablet Take 1 tablet (50 mg total) by mouth daily. 04/19/23   Eliodoro Guerin, DO  fenofibrate  160 MG tablet TAKE ONE TABLET BY MOUTH EVERY DAY 10/28/23   Vicky Grange M, DO  hydrocortisone  (ANUSOL -HC) 2.5 % rectal cream PLACE 1 APPLICATORFUL IN RECTUM 2 TIMES A DAY AS NEEDED FOR HEMORRHOIDS 05/10/23   Vicky Grange M, DO  ibuprofen (ADVIL) 200 MG tablet Take 200 mg by mouth every 6 (six) hours as needed.    [provider]  ketoconazole (NIZORAL) 2 % cream Apply 1 Application topically daily as needed for irritation. 05/21/23   [provider]  levothyroxine  (SYNTHROID ) 137 MCG tablet TAKE ONE TABLET DAILY EXCEPT ON SATURDAY AND SUNDAY 10/28/23   Vicky Grange M, DO  losartan -hydrochlorothiazide  (HYZAAR) 50-12.5 MG tablet Take 1 tablet by mouth daily. 04/19/23   Eliodoro Guerin, DO  pantoprazole  (PROTONIX )  40 MG tablet Take 1 tablet (40 mg total) by mouth daily. 06/25/23   Tobin Forts, MD  rosuvastatin  (CRESTOR ) 40 MG tablet Take 1 tablet (40 mg total) by mouth daily. 04/19/23   Eliodoro Guerin, DO  triamcinolone  (KENALOG ) 0.025 % cream Apply topically daily. 12/17/23   [provider]  zolpidem  (AMBIEN ) 10 MG tablet TAKE ONE-HALF TO 1 TABLET AT BEDTIME AS NEEDED FOR SLEEP 10/28/23   Eliodoro Guerin, DO     Allergies:   No Known Allergies  Social History:   Social History   Socioeconomic History    Marital status: Married    Spouse name: Not on file   Number of children: 4   Years of education: Not on file   Highest education level: 12th grade  Occupational History   Occupation: self employeed  Tobacco Use   Smoking status: Never    Passive exposure: Past   Smokeless tobacco: Never  Vaping Use   Vaping status: Never Used  Substance and Sexual Activity   Alcohol use: No   Drug use: No   Sexual activity: Not on file  Other Topics Concern   Not on file  Social History Narrative   Not on file   Social Drivers of Health   Financial Resource Strain: Low Risk  (10/04/2023)   Overall Financial Resource Strain (CARDIA)    Difficulty of Paying Living Expenses: Not very hard  Food Insecurity: No Food Insecurity (10/04/2023)   Hunger Vital Sign    Worried About Running Out of Food in the Last Year: Never true    Ran Out of Food in the Last Year: Never true  Transportation Needs: No Transportation Needs (10/04/2023)   PRAPARE - Administrator, Civil Service (Medical): No    Lack of Transportation (Non-Medical): No  Physical Activity: Insufficiently Active (10/04/2023)   Exercise Vital Sign    Days of Exercise per Week: 3 days    Minutes of Exercise per Session: 10 min  Stress: Stress Concern Present (10/04/2023)   [employer] of Occupational Health - Occupational Stress Questionnaire    Feeling of Stress : Rather much  Social Connections: Unknown (10/04/2023)   Social Connection and Isolation Panel [NHANES]    Frequency of Communication with Friends and Family: Three times a week    Frequency of Social Gatherings with Friends and Family: Once a week    Attends Religious Services: Patient declined    Database administrator or Organizations: Patient declined    Attends Engineer, structural: Not on file    Marital Status: Married  Catering manager Violence: Not on file    Family History:   The patient's family history includes Diabetes in his father and  mother; Heart attack in his father; Hyperlipidemia in his father; Hypertension in his father and mother; Hypertrophic cardiomyopathy in his mother; Stroke in his mother. There is no history of Colon cancer or Stomach cancer.    ROS:  Please see the history of present illness.  All other ROS reviewed and negative.     Physical Exam/Data:   Vitals:   01/21/24 1945 01/21/24 2035 01/21/24 2040 01/21/24 2045  BP: (!) 111/90     Pulse: 79 75 79 79  Resp: 13 12 15 11   Temp:      TempSrc:      SpO2: 99% 98% 98% 95%  Weight:      Height:       No intake or output  data in the 24 hours ending 01/21/24 2121    01/21/2024    3:10 PM 01/08/2024   12:53 PM 12/05/2023    9:05 AM  Last 3 Weights  Weight (lbs) 190 lb 14.7 oz 191 lb 191 lb  Weight (kg) 86.6 kg 86.637 kg 86.637 kg     Body mass index is 27.39 kg/m.  General:  Well nourished, well developed, in no acute distress HEENT: normal Neck: no JVD Vascular: No carotid bruits; Distal pulses 2+ bilaterally   Cardiac:  normal S1, S2; RRR; no murmur  Lungs:  clear to auscultation bilaterally, no wheezing, rhonchi or rales  Abd: soft, nontender, no hepatomegaly  Ext: no edema Musculoskeletal:  No deformities, BUE and BLE strength normal and equal Skin: warm and dry  Neuro:  CNs 2-12 intact, no focal abnormalities noted Psych:  Normal affect    EKG:  The ECG that was done  was personally reviewed and demonstrates as noted above  Relevant CV Studies: As noted above  Laboratory Data:  High Sensitivity Troponin:   Recent Labs  Lab 01/21/24 1533 01/21/24 1738  TROPONINIHS 10 38*      Chemistry Recent Labs  Lab 01/21/24 1533 01/21/24 1538  NA 140 140  K 3.6 3.5  CL 102 101  CO2 26  --   GLUCOSE 91 92  BUN 17 20  CREATININE 1.39* 1.40*  CALCIUM  9.5  --   GFRNONAA 59*  --   ANIONGAP 12  --     No results for input(s): "PROT", "ALBUMIN", "AST", "ALT", "ALKPHOS", "BILITOT" in the last 168 hours. Lipids No results for  input(s): "CHOL", "TRIG", "HDL", "LABVLDL", "LDLCALC", "CHOLHDL" in the last 168 hours. Hematology Recent Labs  Lab 01/21/24 1533 01/21/24 1538  WBC 10.6*  --   RBC 5.26  --   HGB 16.1 16.3  HCT 45.5 48.0  MCV 86.5  --   MCH 30.6  --   MCHC 35.4  --   RDW 12.6  --   PLT 359  --    Thyroid  No results for input(s): "TSH", "FREET4" in the last 168 hours. BNPNo results for input(s): "BNP", "PROBNP" in the last 168 hours.  DDimer No results for input(s): "DDIMER" in the last 168 hours.   Radiology/Studies:  DG Chest 2 View Result Date: 01/21/2024 CLINICAL DATA:  chest pain. EXAM: CHEST - 2 VIEW COMPARISON:  None Available. FINDINGS: Bilateral lung fields are clear. Bilateral costophrenic angles are clear. Normal cardio-mediastinal silhouette. No acute osseous abnormalities. The soft tissues are within normal limits. IMPRESSION: No active cardiopulmonary disease. Electronically Signed   By: Beula Brunswick M.D.   On: 01/21/2024 15:28     Assessment and Plan:   Chest pain/NSTEMI Hypertension. Hyperlipidemia CKD stage IIIa, creatinine is 1.4 baseline is 1.2-1.3  Plan: --> Admitted under cardiology. Status post full dose aspirin, continue aspirin, high-dose statin, metoprolol, IV heparin GTT. N.p.o. after midnight for left heart catheterization, obtain echocardiogram -Need aggressive lipid control and risk factor modification  Full code   Risk Assessment/Risk Scores:  {Complete the following score calculators/questions to meet required metrics.  Press F2:1}  TIMI Risk Score for Unstable Angina or Non-ST Elevation MI:   The patient's TIMI risk score is 4, which indicates a 20% risk of all cause mortality, new or recurrent myocardial infarction or need for urgent revascularization in the next 14 days.{ Click here to calculate score  REFRESH Note before signing  :1}      Code Status: Full Code  Severity of Illness: The appropriate patient status for this patient is  INPATIENT. Inpatient status is judged to be reasonable and necessary in order to provide the required intensity of service to ensure the patient's safety. The patient's presenting symptoms, physical exam findings, and initial radiographic and laboratory data in the context of their chronic comorbidities is felt to place them at high risk for further clinical deterioration. Furthermore, it is not anticipated that the patient will be medically stable for discharge from the hospital within 2 midnights of admission.   * I certify that at the point of admission it is my clinical judgment that the patient will require inpatient hospital care spanning beyond 2 midnights from the point of admission due to high intensity of service, high risk for further deterioration and high frequency of surveillance required.*   For questions or updates, please contact [facility] Please consult [email or web address] for contact info under     Signed, Cranston Dk, MD  01/21/2024 9:21 PM

## 2024-01-21 NOTE — Progress Notes (Signed)
 PHARMACY - ANTICOAGULATION CONSULT NOTE  Pharmacy Consult for Heparin Infusion Indication: chest pain/ACS  No Known Allergies  Patient Measurements: Height: 5\' 10"  (177.8 cm) Weight: 86.6 kg (190 lb 14.7 oz) IBW/kg (Calculated) : 73 HEPARIN DW (KG): 86.6  Vital Signs: Temp: 98.3 F (36.8 C) (05/13 1511) Temp Source: Oral (05/13 1511) BP: 111/90 (05/13 1945) Pulse Rate: 79 (05/13 2040)  Labs: Recent Labs    01/21/24 1533 01/21/24 1538 01/21/24 1738  HGB 16.1 16.3  --   HCT 45.5 48.0  --   PLT 359  --   --   CREATININE 1.39* 1.40*  --   TROPONINIHS 10  --  38*    Estimated Creatinine Clearance: 60.8 mL/min (A) (by C-G formula based on SCr of 1.4 mg/dL (H)).   Medical History: Past Medical History:  Diagnosis Date   Anxiety    Complication of anesthesia    combative waking up after back surgery   DDD (degenerative disc disease), lumbar    Depression    Hyperlipidemia    Hypertension    Hypothyroidism     Medications:  Not on anticoagulation at home per chart review  Assessment: Patient is a 58 year old male who presented with chest pain that began this afternoon at 1pm. Patient stated he took 2 baby aspirins and a Xanex PTA and reports pain has started to ease. Pharmacy was consulted to start patient on a heparin infusion for ACS/STEMI.  Baseline INR and aPTT ordered.  No signs/symptoms of bleeding per chart review. Hgb 16.3. PLT 359.   Goal of Therapy:  Heparin level 0.3-0.7 units/ml Monitor platelets by anticoagulation protocol: Yes   Plan:  - Give 4000 unit bolus x 1 - Start heparin infusion at a rate of 1050 units/hr - Check heparin level in 6 hours after infusion started - Monitor CBC daily while on heparin   Alice Innocent, PharmD Clinical Pharmacist  01/21/2024,8:52 PM

## 2024-01-21 NOTE — ED Triage Notes (Signed)
 Pt arrived via POV c/o chest pain that began this afternoon at 1pm , while Pt reports he was working in his wood shop, planing wood. Pt reports taking 2 baby aspirin and a xanax  PTA and reports pain has started to ease.

## 2024-01-21 NOTE — ED Notes (Signed)
 Patient transported to X-ray

## 2024-01-22 ENCOUNTER — Encounter (HOSPITAL_COMMUNITY): Payer: Self-pay | Admitting: Cardiology

## 2024-01-22 ENCOUNTER — Inpatient Hospital Stay (HOSPITAL_COMMUNITY)

## 2024-01-22 ENCOUNTER — Encounter (HOSPITAL_COMMUNITY)
Admission: EM | Disposition: A | Discharge status: Home / Self Care | Payer: Self-pay | Source: Home / Self Care | Attending: Cardiology

## 2024-01-22 ENCOUNTER — Other Ambulatory Visit (HOSPITAL_COMMUNITY): Payer: Self-pay | Admitting: *Deleted

## 2024-01-22 DIAGNOSIS — R7989 Other specified abnormal findings of blood chemistry: Secondary | ICD-10-CM | POA: Diagnosis not present

## 2024-01-22 DIAGNOSIS — R079 Chest pain, unspecified: Secondary | ICD-10-CM

## 2024-01-22 DIAGNOSIS — I200 Unstable angina: Secondary | ICD-10-CM

## 2024-01-22 DIAGNOSIS — I5030 Unspecified diastolic (congestive) heart failure: Secondary | ICD-10-CM

## 2024-01-22 HISTORY — PX: LEFT HEART CATH AND CORONARY ANGIOGRAPHY: CATH118249

## 2024-01-22 LAB — ECHOCARDIOGRAM COMPLETE
AR max vel: 2.37 cm2
AV Area VTI: 2.08 cm2
AV Area mean vel: 2.38 cm2
AV Mean grad: 3.7 mmHg
AV Peak grad: 8.4 mmHg
Ao pk vel: 1.45 m/s
Area-P 1/2: 2.5 cm2
Calc EF: 59 %
Height: 70 in
S' Lateral: 1.9 cm
Single Plane A2C EF: 63.2 %
Single Plane A4C EF: 57.4 %
Weight: 3054.69 [oz_av]

## 2024-01-22 LAB — CBC
HCT: 44.2 % (ref 39.0–52.0)
HCT: 42.1 % (ref 39.0–52.0)
Hemoglobin: 15.1 g/dL (ref 13.0–17.0)
Hemoglobin: 14.6 g/dL (ref 13.0–17.0)
MCH: 30 pg (ref 26.0–34.0)
MCH: 30 pg (ref 26.0–34.0)
MCHC: 34.2 g/dL (ref 30.0–36.0)
MCHC: 34.7 g/dL (ref 30.0–36.0)
MCV: 87.9 fL (ref 80.0–100.0)
MCV: 86.6 fL (ref 80.0–100.0)
Platelets: 334 10*3/uL (ref 150–400)
Platelets: 317 10*3/uL (ref 150–400)
RBC: 5.03 MIL/uL (ref 4.22–5.81)
RBC: 4.86 MIL/uL (ref 4.22–5.81)
RDW: 12.8 % (ref 11.5–15.5)
RDW: 12.4 % (ref 11.5–15.5)
WBC: 11.1 10*3/uL — ABNORMAL HIGH (ref 4.0–10.5)
WBC: 9.9 10*3/uL (ref 4.0–10.5)
nRBC: 0 % (ref 0.0–0.2)
nRBC: 0 % (ref 0.0–0.2)

## 2024-01-22 LAB — BASIC METABOLIC PANEL WITH GFR
Anion gap: 11 (ref 5–15)
BUN: 15 mg/dL (ref 6–20)
CO2: 21 mmol/L — ABNORMAL LOW (ref 22–32)
Calcium: 8.8 mg/dL — ABNORMAL LOW (ref 8.9–10.3)
Chloride: 106 mmol/L (ref 98–111)
Creatinine, Ser: 1.23 mg/dL (ref 0.61–1.24)
GFR, Estimated: >60 mL/min (ref >60)
Glucose, Bld: 77 mg/dL (ref 70–99)
Potassium: 3.5 mmol/L (ref 3.5–5.1)
Sodium: 138 mmol/L (ref 135–145)

## 2024-01-22 LAB — LIPID PANEL
Cholesterol: 172 mg/dL (ref 0–200)
HDL: 41 mg/dL (ref >40)
LDL Cholesterol: 97 mg/dL (ref 0–99)
Total CHOL/HDL Ratio: 4.2 ratio
Triglycerides: 171 mg/dL — ABNORMAL HIGH (ref <150)
VLDL: 34 mg/dL (ref 0–40)

## 2024-01-22 LAB — D-DIMER, QUANTITATIVE: D-Dimer, Quant: <0.27 ug{FEU}/mL (ref 0.00–0.50)

## 2024-01-22 LAB — TSH: TSH: 4.687 u[IU]/mL — ABNORMAL HIGH (ref 0.350–4.500)

## 2024-01-22 LAB — CREATININE, SERUM
Creatinine, Ser: 1.12 mg/dL (ref 0.61–1.24)
GFR, Estimated: >60 mL/min (ref >60)

## 2024-01-22 LAB — TROPONIN I (HIGH SENSITIVITY): Troponin I (High Sensitivity): 57 ng/L — ABNORMAL HIGH (ref <18)

## 2024-01-22 LAB — HEPARIN LEVEL (UNFRACTIONATED)
Heparin Unfractionated: 0.36 [IU]/mL (ref 0.30–0.70)
Heparin Unfractionated: 0.4 [IU]/mL (ref 0.30–0.70)

## 2024-01-22 MED ORDER — FENTANYL CITRATE (PF) 100 MCG/2ML IJ SOLN
INTRAMUSCULAR | Status: AC
  Filled 2024-01-22: qty 2

## 2024-01-22 MED ORDER — ACETAMINOPHEN 325 MG PO TABS
650.0000 mg | ORAL_TABLET | ORAL | Status: DC | PRN
  Administered 2024-01-23: 650 mg via ORAL
  Filled 2024-01-22: qty 2

## 2024-01-22 MED ORDER — ASPIRIN 81 MG PO TBEC
81.0000 mg | DELAYED_RELEASE_TABLET | Freq: Every day | ORAL | Status: DC
  Administered 2024-01-23: 81 mg via ORAL
  Filled 2024-01-22: qty 1

## 2024-01-22 MED ORDER — HEPARIN SODIUM (PORCINE) 1000 UNIT/ML IJ SOLN
INTRAMUSCULAR | Status: DC | PRN
  Administered 2024-01-22: 3500 [IU] via INTRAVENOUS

## 2024-01-22 MED ORDER — LEVOTHYROXINE SODIUM 25 MCG PO TABS
137.0000 ug | ORAL_TABLET | Freq: Every day | ORAL | Status: DC
  Administered 2024-01-23: 137 ug via ORAL
  Filled 2024-01-22: qty 1

## 2024-01-22 MED ORDER — VERAPAMIL HCL 2.5 MG/ML IV SOLN
INTRAVENOUS | Status: DC | PRN
  Administered 2024-01-22: 10 mL via INTRA_ARTERIAL

## 2024-01-22 MED ORDER — NITROGLYCERIN 0.4 MG SL SUBL: 0.4000 mg | SUBLINGUAL_TABLET | SUBLINGUAL | Status: DC | PRN

## 2024-01-22 MED ORDER — HEPARIN SODIUM (PORCINE) 1000 UNIT/ML IJ SOLN
INTRAMUSCULAR | Status: AC
  Filled 2024-01-22: qty 10

## 2024-01-22 MED ORDER — SODIUM CHLORIDE 0.9 % WEIGHT BASED INFUSION
3.0000 mL/kg/h | INTRAVENOUS | Status: AC
  Administered 2024-01-22: 3 mL/kg/h via INTRAVENOUS

## 2024-01-22 MED ORDER — SODIUM CHLORIDE 0.9% FLUSH
3.0000 mL | Freq: Two times a day (BID) | INTRAVENOUS | Status: DC
  Administered 2024-01-23: 3 mL via INTRAVENOUS

## 2024-01-22 MED ORDER — ZOLPIDEM TARTRATE 5 MG PO TABS
10.0000 mg | ORAL_TABLET | Freq: Every evening | ORAL | Status: DC | PRN
  Administered 2024-01-23: 10 mg via ORAL
  Filled 2024-01-22: qty 2

## 2024-01-22 MED ORDER — SODIUM CHLORIDE 0.9% FLUSH: 3.0000 mL | INTRAVENOUS | Status: DC | PRN

## 2024-01-22 MED ORDER — ONDANSETRON HCL 4 MG/2ML IJ SOLN: 4.0000 mg | Freq: Four times a day (QID) | INTRAMUSCULAR | Status: DC | PRN

## 2024-01-22 MED ORDER — LIDOCAINE HCL (PF) 1 % IJ SOLN
INTRAMUSCULAR | Status: DC | PRN
  Administered 2024-01-22: 5 mL

## 2024-01-22 MED ORDER — SODIUM CHLORIDE 0.9 % WEIGHT BASED INFUSION
1.0000 mL/kg/h | INTRAVENOUS | Status: DC
  Administered 2024-01-22: 1 mL/kg/h via INTRAVENOUS

## 2024-01-22 MED ORDER — LIDOCAINE HCL (PF) 1 % IJ SOLN
INTRAMUSCULAR | Status: AC
  Filled 2024-01-22: qty 30

## 2024-01-22 MED ORDER — ASPIRIN 81 MG PO TBEC
81.0000 mg | DELAYED_RELEASE_TABLET | Freq: Once | ORAL | Status: AC
  Administered 2024-01-22: 81 mg via ORAL
  Filled 2024-01-22: qty 1

## 2024-01-22 MED ORDER — PANTOPRAZOLE SODIUM 40 MG PO TBEC
40.0000 mg | DELAYED_RELEASE_TABLET | Freq: Every day | ORAL | Status: DC
  Administered 2024-01-23: 40 mg via ORAL
  Filled 2024-01-22: qty 1

## 2024-01-22 MED ORDER — ACETAMINOPHEN 325 MG PO TABS: 650.0000 mg | ORAL_TABLET | ORAL | Status: DC | PRN

## 2024-01-22 MED ORDER — LABETALOL HCL 5 MG/ML IV SOLN: 10.0000 mg | INTRAVENOUS | Status: AC | PRN

## 2024-01-22 MED ORDER — SODIUM CHLORIDE 0.9 % IV SOLN: INTRAVENOUS | Status: AC

## 2024-01-22 MED ORDER — MIDAZOLAM HCL 2 MG/2ML IJ SOLN
INTRAMUSCULAR | Status: DC | PRN
  Administered 2024-01-22: 1 mg via INTRAVENOUS

## 2024-01-22 MED ORDER — ALPRAZOLAM 0.5 MG PO TABS: 0.5000 mg | ORAL_TABLET | Freq: Two times a day (BID) | ORAL | Status: DC | PRN

## 2024-01-22 MED ORDER — FENTANYL CITRATE (PF) 100 MCG/2ML IJ SOLN
INTRAMUSCULAR | Status: DC | PRN
  Administered 2024-01-22: 25 ug via INTRAVENOUS

## 2024-01-22 MED ORDER — ASPIRIN 81 MG PO TBEC
DELAYED_RELEASE_TABLET | ORAL | Status: AC
  Filled 2024-01-22: qty 1

## 2024-01-22 MED ORDER — FENOFIBRATE 160 MG PO TABS
160.0000 mg | ORAL_TABLET | Freq: Every day | ORAL | Status: DC
  Administered 2024-01-23: 160 mg via ORAL
  Filled 2024-01-22: qty 1

## 2024-01-22 MED ORDER — VERAPAMIL HCL 2.5 MG/ML IV SOLN
INTRAVENOUS | Status: AC
  Filled 2024-01-22: qty 2

## 2024-01-22 MED ORDER — IOHEXOL 350 MG/ML SOLN
INTRAVENOUS | Status: DC | PRN
  Administered 2024-01-22: 17 mL

## 2024-01-22 MED ORDER — SODIUM CHLORIDE 0.9 % IV SOLN: 250.0000 mL | INTRAVENOUS | Status: DC | PRN

## 2024-01-22 MED ORDER — HEPARIN SODIUM (PORCINE) 5000 UNIT/ML IJ SOLN
5000.0000 [IU] | Freq: Three times a day (TID) | INTRAMUSCULAR | Status: DC
  Administered 2024-01-23: 5000 [IU] via SUBCUTANEOUS
  Filled 2024-01-22: qty 1

## 2024-01-22 MED ORDER — HYDRALAZINE HCL 20 MG/ML IJ SOLN: 10.0000 mg | INTRAMUSCULAR | Status: AC | PRN

## 2024-01-22 MED ORDER — MIDAZOLAM HCL 2 MG/2ML IJ SOLN
INTRAMUSCULAR | Status: AC
  Filled 2024-01-22: qty 2

## 2024-01-22 MED ORDER — HEPARIN (PORCINE) IN NACL 1000-0.9 UT/500ML-% IV SOLN
INTRAVENOUS | Status: DC | PRN
Start: 2024-01-22 — End: 2024-01-22
  Administered 2024-01-22: 1000 mL

## 2024-01-22 MED ORDER — ROSUVASTATIN CALCIUM 20 MG PO TABS
40.0000 mg | ORAL_TABLET | Freq: Every day | ORAL | Status: DC
  Administered 2024-01-23: 40 mg via ORAL
  Filled 2024-01-22: qty 2

## 2024-01-22 NOTE — Progress Notes (Addendum)
 PHARMACY - ANTICOAGULATION CONSULT NOTE  Pharmacy Consult for heparin Indication: chest pain/ACS  Labs: Recent Labs    01/21/24 1533 01/21/24 1538 01/21/24 1738 01/21/24 2112 01/21/24 2312 01/22/24 0353 01/22/24 0355 01/22/24 0955  HGB 16.1 16.3  --   --   --  15.1  --   --   HCT 45.5 48.0  --   --   --  44.2  --   --   PLT 359  --   --   --   --  334  --   --   APTT  --   --   --  24  --   --   --   --   LABPROT  --   --   --  13.1  --   --   --   --   INR  --   --   --  1.0  --   --   --   --   HEPARINUNFRC  --   --   --   --   --  0.36  --  0.40  CREATININE 1.39* 1.40*  --   --   --   --   --   --   TROPONINIHS 10  --    < > 81* 81*  --  57*  --    < > = values in this interval not displayed.   Assessment/Plan:  58 yo male hx HTN, HLD, FHx CAD now with a confirmed second therapeutic heparin level for NSTEMI. Awaiting transfer to [facility] for Cath. Will continue infusion at current rate of 1050 units/hr and follow up with daily labs.   Ulyses Gandy, PharmD Clinical Pharmacist 01/22/2024 11:41 AM

## 2024-01-22 NOTE — ED Notes (Signed)
 Pt aware of NPO status.

## 2024-01-22 NOTE — Progress Notes (Signed)
*  PRELIMINARY RESULTS* Echocardiogram 2D Echocardiogram has been performed.  [name] 01/22/2024, 11:26 AM

## 2024-01-22 NOTE — ED Notes (Signed)
 Update given to pts wife

## 2024-01-22 NOTE — ED Notes (Signed)
 Phlebotomist at bedside drawing stat BMP at this time

## 2024-01-22 NOTE — ED Notes (Signed)
 [facility] here to transfer pt [facility]

## 2024-01-22 NOTE — Progress Notes (Signed)
 PHARMACY - ANTICOAGULATION CONSULT NOTE  Pharmacy Consult for heparin Indication: chest pain/ACS  Labs: Recent Labs    01/21/24 1533 01/21/24 1538 01/21/24 1738 01/21/24 2112 01/21/24 2312 01/22/24 0353  HGB 16.1 16.3  --   --   --  15.1  HCT 45.5 48.0  --   --   --  44.2  PLT 359  --   --   --   --  334  APTT  --   --   --  24  --   --   LABPROT  --   --   --  13.1  --   --   INR  --   --   --  1.0  --   --   HEPARINUNFRC  --   --   --   --   --  0.36  CREATININE 1.39* 1.40*  --   --   --   --   TROPONINIHS 10  --  38* 81* 81*  --    Assessment/Plan:  58yo male therapeutic on heparin with initial dosing for CP. Will continue infusion at current rate of 1050 units/hr and confirm stable with additional level.  Lonnie Roberts, PharmD, BCPS 01/22/2024 5:22 AM

## 2024-01-22 NOTE — ED Notes (Signed)
 Echo at bedside at this time

## 2024-01-22 NOTE — H&P (Addendum)
 Cardiology Admission History and Physical   Patient ID: JAWUAN HEAGERTY MRN: [id number]; DOB: [birth year]   Admission date: 01/21/2024  PCP:  Eliodoro Guerin, DO   [facility] Providers Cardiologist:  Eilleen Grates, MD   {  Chief Complaint:  Chest Pain   Patient Profile:   Kenneth [name] is a 58 y.o. male with HTN, HLD, family cardiac history of CAD and HOCM, panic attacks, anxiety, hypothyroidism who is being seen 01/22/2024 for the evaluation of chest pain.  History of Present Illness:   Mr. Carpio was last seen in heartcare 04/2020 with [name] for evaluation of PVD with carotid stenosis. Carotid US  04/2020 showed normal study. At that time noted, SOB, HTN, family history of HOCM, and dyslipidemia. EKG showed NSR, HR 67, low voltage in limb leads, early transition in V2. D/C lisinopril  due to cough and switched to losartan . Follow up ECHO showed LVEF 60-65%, no RWMA, G1DD, trivial MVR, and no signs of HOCM. Follow up Coronary calcium  score was 0.  Patient presented to the ED for concerns of chest pain. BMP unremarkable except Cr 1.39. Tn 10 >38 > 81. CBC WNL except WBC mildly elevated 10.6. CXR with no active cardiopulmonary. EKG sinus tachycardia, HR 111, poor amplitude & flatten T in V1 (similar to previous EKG). Treated with Heparin infusion and ASA 162 mg.   On interview, patient reported chest pain. 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 Describes the pain as burning, located in sternal region and radiated to right side of chest and shooting through his body. 7/10 in intensity. Associated symptoms are dyspnea, dizziness, felt like heart was pounding and felt hot but not sweating. No Aggravating factors or  Alleviating factors. During this episode, his apple watch HR remained in the 200's. He tried Xanax  and 2 baby ASA but not relief. Reported history of paniac attacks but this was more  different and more severe. Outside of this episode, denies any CP, SOB, orthopnea, edema or syncope. At this moment, the previous CP described is not present, however he does feel soreness on left side of chest with deep breath. He has worked with [employer] for over 30 years.  Denies tobacco use, ETOH and drugs. Noted compliance to home medications. Family cardiac history includes mother (CAD, hypertrophic cardiomyopathy).     Past Medical History:  Diagnosis Date   Anxiety    Complication of anesthesia    combative waking up after back surgery   DDD (degenerative disc disease), lumbar    Depression    Hyperlipidemia    Hypertension    Hypothyroidism     Past Surgical History:  Procedure Laterality Date   BACK SURGERY     disectomy   MASS EXCISION Left 01/06/2021   Procedure: EXCISION, CYST, BUTTOCK;  Surgeon: Alanda Allegra, MD;  Location: [facility];  Service: General;  Laterality: Left;   THYROIDECTOMY  2010     Medications Prior to Admission: Prior to Admission medications   Medication Sig Start Date End Date Taking? Authorizing Provider  ALPRAZolam  (XANAX ) 1 MG tablet Take 0.5-1 tablets (0.5-1 mg total) by mouth 2 (two) times daily as needed for anxiety (place on hold). 11/05/23   Eliodoro Guerin, DO  celecoxib  (CELEBREX ) 200 MG capsule Take 1 capsule (200 mg total) by mouth 2 (two) times daily. 10/04/23   Eliodoro Guerin, DO  desvenlafaxine  (PRISTIQ ) 50 MG 24  hr tablet Take 1 tablet (50 mg total) by mouth daily. 04/19/23   Eliodoro Guerin, DO  fenofibrate  160 MG tablet TAKE ONE TABLET BY MOUTH EVERY DAY 10/28/23   Vicky Grange M, DO  hydrocortisone  (ANUSOL -HC) 2.5 % rectal cream PLACE 1 APPLICATORFUL IN RECTUM 2 TIMES A DAY AS NEEDED FOR HEMORRHOIDS 05/10/23   Vicky Grange M, DO  ibuprofen (ADVIL) 200 MG tablet Take 200 mg by mouth every 6 (six) hours as needed.    [provider]  ketoconazole (NIZORAL) 2 % cream Apply 1 Application topically daily as  needed for irritation. 05/21/23   [provider]  levothyroxine  (SYNTHROID ) 137 MCG tablet TAKE ONE TABLET DAILY EXCEPT ON SATURDAY AND SUNDAY 10/28/23   Vicky Grange M, DO  losartan -hydrochlorothiazide  (HYZAAR) 50-12.5 MG tablet Take 1 tablet by mouth daily. 04/19/23   Eliodoro Guerin, DO  pantoprazole  (PROTONIX ) 40 MG tablet Take 1 tablet (40 mg total) by mouth daily. 06/25/23   Tobin Forts, MD  rosuvastatin  (CRESTOR ) 40 MG tablet Take 1 tablet (40 mg total) by mouth daily. 04/19/23   Eliodoro Guerin, DO  triamcinolone  (KENALOG ) 0.025 % cream Apply topically daily. 12/17/23   [provider]  zolpidem  (AMBIEN ) 10 MG tablet TAKE ONE-HALF TO 1 TABLET AT BEDTIME AS NEEDED FOR SLEEP 10/28/23   Eliodoro Guerin, DO     Allergies:   No Known Allergies  Social History:   Social History   Socioeconomic History   Marital status: Married    Spouse name: Not on file   Number of children: 4   Years of education: Not on file   Highest education level: 12th grade  Occupational History   Occupation: self employeed  Tobacco Use   Smoking status: Never    Passive exposure: Past   Smokeless tobacco: Never  Vaping Use   Vaping status: Never Used  Substance and Sexual Activity   Alcohol use: No   Drug use: No   Sexual activity: Not on file  Other Topics Concern   Not on file  Social History Narrative   Not on file    Family History:   The patient's family history includes Diabetes in his father and mother; Heart attack in his father; Hyperlipidemia in his father; Hypertension in his father and mother; Hypertrophic cardiomyopathy in his mother; Stroke in his mother. There is no history of Colon cancer or Stomach cancer.    ROS:  Please see the history of present illness.  All other ROS reviewed and negative.     Physical Exam/Data:   Vitals:   01/22/24 0730 01/22/24 0800 01/22/24 0830 01/22/24 0900  BP: 118/80 114/82 129/84 122/89  Pulse: 80 70 79 84  Resp:  12 11 12 14   Temp:      TempSrc:      SpO2: 98% 96% 97% 96%  Weight:      Height:        Intake/Output Summary (Last 24 hours) at 01/22/2024 0917 Last data filed at 01/22/2024 0626 Gross per 24 hour  Intake 132.42 ml  Output --  Net 132.42 ml      01/21/2024    3:10 PM 01/08/2024   12:53 PM 12/05/2023    9:05 AM  Last 3 Weights  Weight (lbs) 190 lb 14.7 oz 191 lb 191 lb  Weight (kg) 86.6 kg 86.637 kg 86.637 kg     Body mass index is 27.39 kg/m.  General:  Laying in bed, in no acute  distress HEENT: normal Neck: no JVD Vascular: No carotid bruits; Distal pulses 2+ bilaterally   Cardiac:  normal S1, S2; RRR; no murmur, reproducible pain on left side of chest  Lungs:  clear to auscultation bilaterally, no wheezing, rhonchi or rales  Abd: soft, nontender, no hepatomegaly  Ext: no edema Musculoskeletal:  No deformities, BUE and BLE strength normal and equal Skin: warm and dry  Neuro:  CNs 2-12 intact, no focal abnormalities noted Psych:  Normal affect    EKG:  The ECG that was done  was personally reviewed and demonstrates  Initial EKG sinus tachycardia, HR 111, poor amplitude & flatten T in V1 (similar to previous EKG) Repeat EKG: NSR, HR 80's, poor amplitude & flatten T in V  without ischemic changes Tele: NSR, HR 70-90's  Relevant CV Studies: ECHO IMPRESSIONS 05/2020  1. Left ventricular ejection fraction, by estimation, is 60 to 65%. The  left ventricle has normal function. The left ventricle has no regional  wall motion abnormalities. Left ventricular diastolic parameters are  consistent with Grade I diastolic  dysfunction (impaired relaxation).   2. Right ventricular systolic function is normal. The right ventricular  size is normal. There is normal pulmonary artery systolic pressure. The  estimated right ventricular systolic pressure is 16.4 mmHg.   3. The mitral valve is normal in structure. Trivial mitral valve  regurgitation. No evidence of mitral stenosis.   4.  The aortic valve is tricuspid. Aortic valve regurgitation is not  visualized. No aortic stenosis is present.   5. The inferior vena cava is normal in size with greater than 50%  respiratory variability, suggesting right atrial pressure of 3 mmHg.   CT cardiac scoring/CT chest 05/2020 IMPRESSION: Coronary calcium  score of 0 Agatston units. This suggests low risk for future cardiac events. IMPRESSION: 1. Hepatic steatosis.  US  carotid 04/2020 IMPRESSION: Color duplex indicates minimal homogeneous plaque, with no hemodynamically significant stenosis by duplex criteria in the extracranial cerebrovascular circulation.    Laboratory Data:  High Sensitivity Troponin:   Recent Labs  Lab 01/21/24 1533 01/21/24 1738 01/21/24 2112 01/21/24 2312 01/22/24 0355  TROPONINIHS 10 38* 81* 81* 57*      Chemistry Recent Labs  Lab 01/21/24 1533 01/21/24 1538  NA 140 140  K 3.6 3.5  CL 102 101  CO2 26  --   GLUCOSE 91 92  BUN 17 20  CREATININE 1.39* 1.40*  CALCIUM  9.5  --   GFRNONAA 59*  --   ANIONGAP 12  --     No results for input(s): "PROT", "ALBUMIN", "AST", "ALT", "ALKPHOS", "BILITOT" in the last 168 hours. Lipids No results for input(s): "CHOL", "TRIG", "HDL", "LABVLDL", "LDLCALC", "CHOLHDL" in the last 168 hours. Hematology Recent Labs  Lab 01/21/24 1533 01/21/24 1538 01/22/24 0353  WBC 10.6*  --  11.1*  RBC 5.26  --  5.03  HGB 16.1 16.3 15.1  HCT 45.5 48.0 44.2  MCV 86.5  --  87.9  MCH 30.6  --  30.0  MCHC 35.4  --  34.2  RDW 12.6  --  12.8  PLT 359  --  334   Thyroid  No results for input(s): "TSH", "FREET4" in the last 168 hours. BNPNo results for input(s): "BNP", "PROBNP" in the last 168 hours.  DDimer No results for input(s): "DDIMER" in the last 168 hours.   Radiology/Studies:  DG Chest 2 View Result Date: 01/21/2024 CLINICAL DATA:  chest pain. EXAM: CHEST - 2 VIEW COMPARISON:  None Available. FINDINGS: Bilateral lung  fields are clear. Bilateral  costophrenic angles are clear. Normal cardio-mediastinal silhouette. No acute osseous abnormalities. The soft tissues are within normal limits. IMPRESSION: No active cardiopulmonary disease. Electronically Signed   By: Beula Brunswick M.D.   On: 01/21/2024 15:28     Assessment and Plan:   NSTEMI Chest pain  05/2020: ECHO showed LVEF 60-65%, no RWMA, G1DD, trivial MVR, and no signs of HOCM. 05/2020: Coronary calcium  score was 0. Has hx of panic attacks but noted this was different and worse. Reported typical CP as above x 45 min - 1 hr, burning, 7/10. Associated with SOB, dizziness. Currently only, left side soreness to chest at rest and with palpitations.  Tn 10 >38 > 81>57. CXR WNL. EKG sinus tachycardia, HR 111, poor amplitude & flatten T in V1 (similar to previous EKG). Treated with Heparin infusion and ASA 162 mg.  ECHO Pending.  Concerned for ischemic cause with presentation, cardiac risk factors, family history and elevated troponin. Will continue to transfer to [facility] for cath if MD agrees.  Continue Heparin IV. Ordering ASA 81 mg, Crestor  40 mg   Informed Consent   Shared Decision Making/Informed Consent{ The risks [stroke (1 in 1000), death (1 in 1000), kidney failure [usually temporary] (1 in 500), bleeding (1 in 200), allergic reaction [possibly serious] (1 in 200)], benefits (diagnostic support and management of coronary artery disease) and alternatives of a cardiac catheterization were discussed in detail with Mr. Giglio and he is willing to proceed.      Tachycardia  Reported HR of 200's with CP episode. Can consider outpatient monitor.  Initial HR elevated 110 vs now mainly 70-90's without any medications.  K 3.5. WBC mildly elevated 11.1.TSH pending. CXR normal.  Could have been 2/2 to anxiety and stress.  Will order D-dimer today. Can consider CTA if cath normal and elevated d-dimer.   AKI  Cr 1.40 (baseline Cr 1.0-1.1) Reported very few fluid since today and yesterday. Has  not yet received fluids. Suspect most likely related to dehydration.  Ordered fluids  HTN  Per chart review, previously treated with Lisinopril , which was discontinue due to cough. Switched to Losartan .  Initial BP 143/123, HR 105 but now BP 109/80, HR 69 without any HTN meds or NTG.  Home meds: losartan -hydrochlorothiazide  50-12.5 mg daily.  Reported home BP 130's/80's. Noted compliance, last dose yesterday morning. Will hold today given cath and renal function.  HLD  Per chart review, hx of severe hypertriglyceridemia well managed with Fenofibrate , however, d/c due to elevated liver enzymes. Today, reported he was started back on this medication.  04/2023: LDL 142, 11/2023: AST/ALT 39/46. Lipid Panel pending. Home meds: Crestor  40 mg daily, Fenofibrate  160 mg daily. Noted compliance, last dose yesterday morning. Will order to continue these meds.    Risk Assessment/Risk Scores:   TIMI Risk Score for Unstable Angina or Non-ST Elevation MI:   The patient's TIMI risk score is 2, which indicates a 8% risk of all cause mortality, new or recurrent myocardial infarction or need for urgent revascularization in the next 14 days.{   Code Status: Full Code  Severity of Illness: The appropriate patient status for this patient is INPATIENT. Inpatient status is judged to be reasonable and necessary in order to provide the required intensity of service to ensure the patient's safety. The patient's presenting symptoms, physical exam findings, and initial radiographic and laboratory data in the context of their chronic comorbidities is felt to place them at high risk for further clinical deterioration.  Furthermore, it is not anticipated that the patient will be medically stable for discharge from the hospital within 2 midnights of admission.   * I certify that at the point of admission it is my clinical judgment that the patient will require inpatient hospital care spanning beyond 2 midnights from the  point of admission due to high intensity of service, high risk for further deterioration and high frequency of surveillance required.*   For questions or updates, please contact [facility] Please consult [email or web address] for contact info under     Signed, Metta Actis, PA-C  01/22/2024 9:17 AM      Attending Note  Patient seen and discussed with PA Jorie Newness, I agree with her documentation. 58 yo male history of HTN, HLD, family history of HOCM, presents with chest pain. Pressure mid chest with associated SOB, felt hot all over, +palpitatoins. Symptoms lasted roughly 90 minutes and resolved. Has not had significnat recurrence since initial episode.   K 3.6 Cr 1.39 BUN 17 WBC 10.6 Hgb 16.1 Plt 359  Trop 57--> 38-->81-->81-->57 EKG SR, no ischemic changes   05/2020 echo LVE 60-65%, no WMAs, grade I dd, no LVH   1.NSTEMI - trop up to 81, EKG without specific ischemic changes - echo pending - patient has already been arranged for transfer to [facility] with cath arranged, agree with plan - medical therapy with hep gtt, ASA 81, crestor  40mg . . Initially soft bp, hold on beta blocker and ACE/ARB for now  - did have significant palpitations during episode. His smartwatch recorded heart rates 180s to 200s by rates, there are no tracings from watch. Has been in SR here. If coronaries clean may have been a symptomatic arrhythmia, could consider outpatient monitor    Armida Lander MD

## 2024-01-22 NOTE — Progress Notes (Signed)
 Pt brought to cath lab holding, Bay 7, connected to monitor, denies pain, remains on RA, 20g PIV noted to LAC and 20g PIV to RFA with IV Heparin at 10.5 cc/hour = 1050 units/hour, IV NS infusing at 87cc/hr, safety maintained, Report received from [facility]

## 2024-01-22 NOTE — ED Notes (Signed)
 Pt ambulated to restroom.

## 2024-01-23 ENCOUNTER — Encounter (HOSPITAL_COMMUNITY): Payer: Self-pay | Admitting: Cardiology

## 2024-01-23 ENCOUNTER — Other Ambulatory Visit: Payer: Self-pay | Admitting: Cardiology

## 2024-01-23 ENCOUNTER — Inpatient Hospital Stay (INDEPENDENT_AMBULATORY_CARE_PROVIDER_SITE_OTHER)

## 2024-01-23 DIAGNOSIS — R002 Palpitations: Secondary | ICD-10-CM

## 2024-01-23 DIAGNOSIS — I214 Non-ST elevation (NSTEMI) myocardial infarction: Secondary | ICD-10-CM

## 2024-01-23 LAB — BASIC METABOLIC PANEL WITH GFR
Anion gap: 11 (ref 5–15)
BUN: 14 mg/dL (ref 6–20)
CO2: 20 mmol/L — ABNORMAL LOW (ref 22–32)
Calcium: 8.8 mg/dL — ABNORMAL LOW (ref 8.9–10.3)
Chloride: 107 mmol/L (ref 98–111)
Creatinine, Ser: 1.09 mg/dL (ref 0.61–1.24)
GFR, Estimated: >60 mL/min (ref >60)
Glucose, Bld: 85 mg/dL (ref 70–99)
Potassium: 3.7 mmol/L (ref 3.5–5.1)
Sodium: 138 mmol/L (ref 135–145)

## 2024-01-23 LAB — CBC
HCT: 46.1 % (ref 39.0–52.0)
Hemoglobin: 15 g/dL (ref 13.0–17.0)
MCH: 29.3 pg (ref 26.0–34.0)
MCHC: 32.5 g/dL (ref 30.0–36.0)
MCV: 90 fL (ref 80.0–100.0)
Platelets: 305 10*3/uL (ref 150–400)
RBC: 5.12 MIL/uL (ref 4.22–5.81)
RDW: 12.5 % (ref 11.5–15.5)
WBC: 9.5 10*3/uL (ref 4.0–10.5)
nRBC: 0 % (ref 0.0–0.2)

## 2024-01-23 LAB — HEPARIN LEVEL (UNFRACTIONATED): Heparin Unfractionated: <0.1 [IU]/mL — ABNORMAL LOW (ref 0.30–0.70)

## 2024-01-23 MED ORDER — METOPROLOL SUCCINATE ER 25 MG PO TB24
25.0000 mg | ORAL_TABLET | Freq: Every day | ORAL | Status: DC
  Administered 2024-01-23: 25 mg via ORAL
  Filled 2024-01-23: qty 1

## 2024-01-23 MED ORDER — METOPROLOL SUCCINATE ER 25 MG PO TB24
25.0000 mg | ORAL_TABLET | Freq: Every day | ORAL | 3 refills | Status: DC
Start: 2024-01-23 — End: 2024-05-15

## 2024-01-23 NOTE — TOC CM/SW Note (Signed)
 Transition of Care [facility]) - Inpatient Brief Assessment   Patient Details  Name: Kenneth [name] MRN: [id number] Date of Birth: 1966/06/17  [facility] of [facility] CM/SW Contact:    Cosimo Diones, RN Phone Number: 01/23/2024, 10:44 AM   Clinical Narrative: Patient presented for chest pain- Nstemi. PTA patient was independent from home with spouse. Patient has PCP and can get to appointments without any issues. No home needs identified during the visit.    Transition of Care Asessment: Insurance and Status: Insurance coverage has been reviewed Patient has primary care physician: Yes Home environment has been reviewed: reviewed Prior level of function:: independent Prior/Current Home Services: No current home services Social Drivers of Health Review: SDOH reviewed no interventions necessary Readmission risk has been reviewed: Yes Transition of care needs: no transition of care needs at this time

## 2024-01-23 NOTE — Progress Notes (Signed)
 Nursing Discharge Note   Name: Kenneth [name] MRN: [id number] DOB: 07/10/1966    Admit Date:  01/21/2024  Discharge Date:  01/23/2024   BERNEY PUETT is to be discharged home per MD order.  AVS completed. Reviewed with patient and family at bedside. Highlighted copy provided for patient to take home.  Patient/caregiver able to verbalize understanding of discharge instructions. PIV removed. Patient stable upon discharge.   Patient aware of new discharge medications sent to home pharmacy.   Discharge Instructions   None      Discharge Instructions     Diet - low sodium heart healthy   Complete by: As directed    Discharge instructions   Complete by: As directed    Radial Site Care Refer to this sheet in the next few weeks. These instructions provide you with information on caring for yourself after your procedure. Your caregiver may also give you more specific instructions. Your treatment has been planned according to current medical practices, but problems sometimes occur. Call your caregiver if you have any problems or questions after your procedure. HOME CARE INSTRUCTIONS You may shower the day after the procedure. Remove the bandage (dressing) and gently wash the site with plain soap and water. Gently pat the site dry.  Do not apply powder or lotion to the site.  Do not submerge the affected site in water for 3 to 5 days.  Inspect the site at least twice daily.  Do not flex or bend the affected arm for 24 hours.  No lifting over 5 pounds (2.3 kg) for 5 days after your procedure.  Do not drive home if you are discharged the same day of the procedure. Have someone else drive you.  You may drive 24 hours after the procedure unless otherwise instructed by your caregiver.  What to expect: Any bruising will usually fade within 1 to 2 weeks.  Blood that collects in the tissue (hematoma) may be painful to the touch. It should usually decrease in size and tenderness within 1 to  2 weeks.  SEEK IMMEDIATE MEDICAL CARE IF: You have unusual pain at the radial site.  You have redness, warmth, swelling, or pain at the radial site.  You have drainage (other than a small amount of blood on the dressing).  You have chills.  You have a fever or persistent symptoms for more than 72 hours.  You have a fever and your symptoms suddenly get worse.  Your arm becomes pale, cool, tingly, or numb.  You have heavy bleeding from the site. Hold pressure on the site.   Increase activity slowly   Complete by: As directed         Allergies as of 01/23/2024   No Known Allergies      Medication List     TAKE these medications    ALPRAZolam  1 MG tablet Commonly known as: XANAX  Take 0.5-1 tablets (0.5-1 mg total) by mouth 2 (two) times daily as needed for anxiety (place on hold).   celecoxib  200 MG capsule Commonly known as: CeleBREX  Take 1 capsule (200 mg total) by mouth 2 (two) times daily.   desvenlafaxine  50 MG 24 hr tablet Commonly known as: Pristiq  Take 1 tablet (50 mg total) by mouth daily.   fenofibrate  160 MG tablet TAKE ONE TABLET BY MOUTH EVERY DAY   hydrocortisone  2.5 % rectal cream Commonly known as: ANUSOL -HC PLACE 1 APPLICATORFUL IN RECTUM 2 TIMES A DAY AS NEEDED FOR HEMORRHOIDS   ibuprofen 200  MG tablet Commonly known as: ADVIL Take 200 mg by mouth every 6 (six) hours as needed.   ketoconazole 2 % cream Commonly known as: NIZORAL Apply 1 Application topically daily as needed for irritation.   levothyroxine  137 MCG tablet Commonly known as: SYNTHROID  TAKE ONE TABLET DAILY EXCEPT ON SATURDAY AND SUNDAY What changed:  how much to take how to take this when to take this   losartan -hydrochlorothiazide  50-12.5 MG tablet Commonly known as: HYZAAR Take 1 tablet by mouth daily.   metoprolol succinate 25 MG 24 hr tablet Commonly known as: TOPROL-XL Take 1 tablet (25 mg total) by mouth daily.   pantoprazole  40 MG tablet Commonly known as:  PROTONIX  Take 1 tablet (40 mg total) by mouth daily.   rosuvastatin  40 MG tablet Commonly known as: CRESTOR  Take 1 tablet (40 mg total) by mouth daily.   triamcinolone  0.025 % cream Commonly known as: KENALOG  Apply topically daily.   Vitamin D3 50 MCG (2000 UT) Tabs Take by mouth.   zolpidem  10 MG tablet Commonly known as: AMBIEN  TAKE ONE-HALF TO 1 TABLET AT BEDTIME AS NEEDED FOR SLEEP         Discharge Instructions/ Education: An After Visit Summary was printed and given to the patient. Discharge instructions given to patient/family with verbalized understanding. Discharge education completed with patient/family including: follow up instructions, medication list, discharge activities, and limitations if indicated.  Additional discharge instructions as indicated by discharging provider also reviewed.  Patient and family able to verbalize understanding, all questions fully answered. Patient instructed to return to Emergency Department, call 911, or call MD for any changes in condition.   Patient escorted via wheelchair to lobby and discharged home via private automobile.

## 2024-01-23 NOTE — Discharge Summary (Addendum)
 Discharge Summary    Patient ID: Kenneth [name] MRN: [id number]; DOB: 1966/08/25  Admit date: 01/21/2024 Discharge date: 01/23/2024  PCP:  Eliodoro Guerin, DO   [facility] Providers Cardiologist:  Eilleen Grates, MD       Discharge Diagnoses    Principal Problem:   NSTEMI (non-ST elevated myocardial infarction) [facility]) Active Problems:   Hyperlipidemia   Hypothyroidism   Essential hypertension    Diagnostic Studies/Procedures    Echocardiogram 01/22/24   1. Left ventricular ejection fraction, by estimation, is 60 to 65%. The  left ventricle has normal function. The left ventricle has no regional  wall motion abnormalities. Left ventricular diastolic parameters are  consistent with Grade I diastolic  dysfunction (impaired relaxation).   2. Right ventricular systolic function is normal. The right ventricular  size is normal.   3. The mitral valve is normal in structure. Trivial mitral valve  regurgitation. No evidence of mitral stenosis.   4. The aortic valve is tricuspid. Aortic valve regurgitation is not  visualized. No aortic stenosis is present.   5. The inferior vena cava is normal in size with greater than 50%  respiratory variability, suggesting right atrial pressure of 3 mmHg.   Left heart Catheterization 01/22/24   LM: Normal LAD: Normal. No significant disease. Ramus: Normal. No significant disease. Lcx: Normal. No significant disease. RCA: Normal. No significant disease.   LVEDP 1 mmHg   No coronary artery disease     Manish Corliss Dies, MD   _____________   History of Present Illness     Kenneth [name] is a 58 y.o. male with hypertension, hyperlipidemia, family history of CAD and hokum, panic attacks, anxiety, hypothyroidism.  He is followed by [name].  Previously seen in 04/2020 for evaluation of possible carotid stenosis and family history of hyper trophic cardiomyopathy and CAD.  Carotid ultrasounds on 04/27/2020 showed no  hemodynamically significant stenosis by duplex criteria and extracranial cerebrovascular circulation.  CT calcium  scoring on 05/17/2020 showed coronary calcium  score of 0 Agatston units.  Echocardiogram 05/17/2020 showed no evidence of hokum, EF 60-65%, no regional wall motion abnormalities, normal RV systolic function, no significant valvular abnormalities  Patient presented to [facility] on 5/14 complaining of chest pain.  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  Pain was described as a burning feeling located in the sternal region, radiated to right side of chest.  Associated with dyspnea, dizziness, palpitations.  High-sensitivity troponin 10, 38, 81.  D-dimer within normal limits.  EKG was without specific ischemic changes.  Overall, presentation was concerning for NSTEMI and he was transferred to [facility] for cardiac catheterization  Hospital Course     At [facility], patient was given full dose aspirin and started on IV heparin for suspected NSTEMI.  He was transferred to [facility] for further evaluation and was admitted to the cardiology service.  He underwent echocardiogram 5/14 that showed EF 60-65%, no regional wall motion abnormalities, grade 1 DD, normal RV systolic function, no significant valvular abnormalities.  He underwent left heart catheterization on 5/14 that showed no coronary artery disease, LVEDP 1 mmHg.  He was admitted overnight for observation following the procedure.  On 5/15, lab work revealed normal kidney function with creatinine 1.09, eGFR >60. CBC within normal limits. He did not have further episodes of palpitations/tachycardia while admitted. Recommended 14 day zio at discharge for further  evaluation.   Chest Pain  - hsTn 38>81>81>57  - As above, cardiac catheterization this admission showed no coronary artery disease.  Echo showed EF 60-65%, normal RV systolic function.   D-dimer within normal limits - Radial cath site soft, nontender prior to DC. Discussed radial site care and provided written instructions on AVS  - Patient also noted significant palpitations and elevated heart rates during episode of chest pain.  Possible chest pain with symptom of SVT - Monitor as below  Tachycardia  - Patient reported having heart rate 200 bpm and palpitations during episode of chest pain prior to admission. EKG captured via apple watch suspicious for SVT  - Telemetry this admission showed normal sinus rhythm.  He denies palpitations while admitted - K 3.7. TSH minimally elevated to 4.687  - Start metoprolol succinate 25 mg daily  - Ordered 14 day zio at discharge   AKI  - Creatinine initially 1.40 in the ED on 5/13   - Had improved to 1.12 prior to cath on 5/14.  Creatinine stable at 1.09 prior to discharge on 5/15  HTN  - Continue home losartan -hydrochlorothiazide  50-12.5 mg daily  HLD  - Lipid panel this admission with LDL 97, HDL 41, triglycerides 171, total cholesterol 172 - Continue fenofibrate  160 mg daily, Crestor  40 mg daily  Hypothyroidism  - Patient previously had thyroidectomy in 2010. On synthroid . Continue home dose at DC   Did the patient have an acute coronary syndrome (MI, NSTEMI, STEMI, etc) this admission?:  No.   The elevated Troponin was due to the acute medical illness (demand ischemia).      _____________  Discharge Vitals Blood pressure (!) 163/95, pulse 78, temperature 98.4 F (36.9 C), temperature source Oral, resp. rate 16, height 5\' 11"  (1.803 m), weight 82.5 kg, SpO2 99%.  Filed Weights   01/21/24 1510 01/22/24 2124  Weight: 86.6 kg 82.5 kg    Labs & Radiologic Studies    CBC Recent Labs    01/21/24 1533 01/21/24 1538 01/22/24 2225 01/23/24 0522  WBC 10.6*   < > 9.9 9.5  NEUTROABS 6.2  --   --   --   HGB 16.1   < > 14.6 15.0  HCT 45.5   < > 42.1 46.1  MCV 86.5   < > 86.6 90.0  PLT 359   < > 317 305   < > =  values in this interval not displayed.   Basic Metabolic Panel Recent Labs    16/10/96 1351 01/22/24 2225 01/23/24 0522  NA 138  --  138  K 3.5  --  3.7  CL 106  --  107  CO2 21*  --  20*  GLUCOSE 77  --  85  BUN 15  --  14  CREATININE 1.23 1.12 1.09  CALCIUM  8.8*  --  8.8*   Liver Function Tests No results for input(s): "AST", "ALT", "ALKPHOS", "BILITOT", "PROT", "ALBUMIN" in the last 72 hours. No results for input(s): "LIPASE", "AMYLASE" in the last 72 hours. High Sensitivity Troponin:   Recent Labs  Lab 01/21/24 1533 01/21/24 1738 01/21/24 2112 01/21/24 2312 01/22/24 0355  TROPONINIHS 10 38* 81* 81* 57*    BNP Invalid input(s): "POCBNP" D-Dimer Recent Labs    01/22/24 0955  DDIMER <0.27   Hemoglobin A1C No results for input(s): "HGBA1C" in the last 72 hours. Fasting Lipid Panel Recent Labs    01/22/24 0948  CHOL 172  HDL 41  LDLCALC 97  TRIG 171*  CHOLHDL 4.2   Thyroid  Function Tests Recent Labs    01/22/24 0948  TSH 4.687*   _____________  CARDIAC CATHETERIZATION Result Date: 01/22/2024 Coronary angiography 01/22/2024: LM: Normal LAD: Normal. No significant disease. Ramus: Normal. No significant disease. Lcx: Normal. No significant disease. RCA: Normal. No significant disease. LVEDP 1 mmHg No coronary artery disease Cody Das, MD  ECHOCARDIOGRAM COMPLETE Result Date: 01/22/2024    ECHOCARDIOGRAM REPORT   Patient Name:   DELIO SHNAYDER Date of Exam: 01/22/2024 Medical Rec #:  [id number]        Height:       70.0 in Accession #:    [id number]       Weight:       190.9 lb Date of Birth:  [birth year]        BSA:          2.047 m Patient Age:    57 years         BP:           109/80 mmHg Patient Gender: M                HR:           69 bpm. Exam Location:  [facility] Procedure: 2D Echo, 3D Echo, Cardiac Doppler and Color Doppler (Both Spectral            and Color Flow Doppler were utilized during procedure). Indications:    Elevated Troponin   History:        Patient has prior history of Echocardiogram examinations, most                 recent 05/17/2020. NSTEMI; Risk Factors:Hypertension and                 Dyslipidemia.  Sonographer:    Denese Finn RCS Referring Phys: [id number] Metta Actis IMPRESSIONS  1. Left ventricular ejection fraction, by estimation, is 60 to 65%. The left ventricle has normal function. The left ventricle has no regional wall motion abnormalities. Left ventricular diastolic parameters are consistent with Grade I diastolic dysfunction (impaired relaxation).  2. Right ventricular systolic function is normal. The right ventricular size is normal.  3. The mitral valve is normal in structure. Trivial mitral valve regurgitation. No evidence of mitral stenosis.  4. The aortic valve is tricuspid. Aortic valve regurgitation is not visualized. No aortic stenosis is present.  5. The inferior vena cava is normal in size with greater than 50% respiratory variability, suggesting right atrial pressure of 3 mmHg. FINDINGS  Left Ventricle: Left ventricular ejection fraction, by estimation, is 60 to 65%. The left ventricle has normal function. The left ventricle has no regional wall motion abnormalities. The left ventricular internal cavity size was normal in size. There is  no left ventricular hypertrophy. Left ventricular diastolic parameters are consistent with Grade I diastolic dysfunction (impaired relaxation). Right Ventricle: The right ventricular size is normal. Right vetricular wall thickness was not well visualized. Right ventricular systolic function is normal. Left Atrium: Left atrial size was normal in size. Right Atrium: Right atrial size was normal in size. Pericardium: There is no evidence of pericardial effusion. Mitral Valve: The mitral valve is normal in structure. Trivial mitral valve regurgitation. No evidence of mitral valve stenosis. Tricuspid Valve: The tricuspid valve is normal in structure. Tricuspid valve regurgitation  is not demonstrated. No evidence of tricuspid stenosis. Aortic Valve: The aortic valve is tricuspid. Aortic valve regurgitation is not visualized. No aortic  stenosis is present. Aortic valve mean gradient measures 3.7 mmHg. Aortic valve peak gradient measures 8.4 mmHg. Aortic valve area, by VTI measures 2.08 cm. Pulmonic Valve: The pulmonic valve was not well visualized. Pulmonic valve regurgitation is not visualized. No evidence of pulmonic stenosis. Aorta: The aortic root is normal in size and structure. Venous: The inferior vena cava is normal in size with greater than 50% respiratory variability, suggesting right atrial pressure of 3 mmHg. IAS/Shunts: No atrial level shunt detected by color flow Doppler. Additional Comments: 3D was performed not requiring image post processing on an independent workstation and was normal.  LEFT VENTRICLE PLAX 2D LVIDd:         3.80 cm     Diastology LVIDs:         1.90 cm     LV e' medial:    9.14 cm/s LV PW:         0.90 cm     LV E/e' medial:  7.5 LV IVS:        0.90 cm     LV e' lateral:   9.57 cm/s LVOT diam:     1.90 cm     LV E/e' lateral: 7.2 LV SV:         58 LV SV Index:   29 LVOT Area:     2.84 cm                             3D Volume EF: LV Volumes (MOD)           3D EF:        62 % LV vol d, MOD A2C: 71.0 ml LV EDV:       117 ml LV vol d, MOD A4C: 61.0 ml LV ESV:       44 ml LV vol s, MOD A2C: 26.1 ml LV SV:        72 ml LV vol s, MOD A4C: 26.0 ml LV SV MOD A2C:     44.9 ml LV SV MOD A4C:     61.0 ml LV SV MOD BP:      38.8 ml RIGHT VENTRICLE RV S prime:     13.50 cm/s TAPSE (M-mode): 2.2 cm LEFT ATRIUM             Index        RIGHT ATRIUM           Index LA diam:        2.80 cm 1.37 cm/m   RA Area:     13.90 cm LA Vol (A2C):   32.5 ml 15.88 ml/m  RA Volume:   34.60 ml  16.91 ml/m LA Vol (A4C):   29.3 ml 14.32 ml/m LA Biplane Vol: 30.9 ml 15.10 ml/m  AORTIC VALVE AV Area (Vmax):    2.37 cm AV Area (Vmean):   2.38 cm AV Area (VTI):     2.08 cm AV Vmax:            144.77 cm/s AV Vmean:          87.635 cm/s AV VTI:            0.281 m AV Peak Grad:      8.4 mmHg AV Mean Grad:      3.7 mmHg LVOT Vmax:         121.00 cm/s LVOT Vmean:        73.500 cm/s LVOT VTI:  0.206 m LVOT/AV VTI ratio: 0.73  AORTA Ao Root diam: 3.60 cm MITRAL VALVE MV Area (PHT): 2.50 cm    SHUNTS MV Decel Time: 303 msec    Systemic VTI:  0.21 m MV E velocity: 68.60 cm/s  Systemic Diam: 1.90 cm MV A velocity: 85.70 cm/s MV E/A ratio:  0.80 Armida Lander MD Electronically signed by Armida Lander MD Signature Date/Time: 01/22/2024/1:10:14 PM    Final    DG Chest 2 View Result Date: 01/21/2024 CLINICAL DATA:  chest pain. EXAM: CHEST - 2 VIEW COMPARISON:  None Available. FINDINGS: Bilateral lung fields are clear. Bilateral costophrenic angles are clear. Normal cardio-mediastinal silhouette. No acute osseous abnormalities. The soft tissues are within normal limits. IMPRESSION: No active cardiopulmonary disease. Electronically Signed   By: Beula Brunswick M.D.   On: 01/21/2024 15:28   XR Shoulder Left Result Date: 01/08/2024 No glenohumeral joint space narrowing was noted.  No acromioclavicular joint space narrowing was noted.  Mild spurring was noted. Impression:  These findings were suggestive of acromioclavicular arthropathy.  Disposition   Pt is being discharged home today in good condition.  Follow-up Plans & Appointments     Discharge Instructions     Diet - low sodium heart healthy   Complete by: As directed    Discharge instructions   Complete by: As directed    Radial Site Care Refer to this sheet in the next few weeks. These instructions provide you with information on caring for yourself after your procedure. Your caregiver may also give you more specific instructions. Your treatment has been planned according to current medical practices, but problems sometimes occur. Call your caregiver if you have any problems or questions after your procedure. HOME CARE  INSTRUCTIONS You may shower the day after the procedure. Remove the bandage (dressing) and gently wash the site with plain soap and water. Gently pat the site dry.  Do not apply powder or lotion to the site.  Do not submerge the affected site in water for 3 to 5 days.  Inspect the site at least twice daily.  Do not flex or bend the affected arm for 24 hours.  No lifting over 5 pounds (2.3 kg) for 5 days after your procedure.  Do not drive home if you are discharged the same day of the procedure. Have someone else drive you.  You may drive 24 hours after the procedure unless otherwise instructed by your caregiver.  What to expect: Any bruising will usually fade within 1 to 2 weeks.  Blood that collects in the tissue (hematoma) may be painful to the touch. It should usually decrease in size and tenderness within 1 to 2 weeks.  SEEK IMMEDIATE MEDICAL CARE IF: You have unusual pain at the radial site.  You have redness, warmth, swelling, or pain at the radial site.  You have drainage (other than a small amount of blood on the dressing).  You have chills.  You have a fever or persistent symptoms for more than 72 hours.  You have a fever and your symptoms suddenly get worse.  Your arm becomes pale, cool, tingly, or numb.  You have heavy bleeding from the site. Hold pressure on the site.   Increase activity slowly   Complete by: As directed         Discharge Medications   Allergies as of 01/23/2024   No Known Allergies      Medication List     TAKE these medications    ALPRAZolam  1  MG tablet Commonly known as: XANAX  Take 0.5-1 tablets (0.5-1 mg total) by mouth 2 (two) times daily as needed for anxiety (place on hold).   celecoxib  200 MG capsule Commonly known as: CeleBREX  Take 1 capsule (200 mg total) by mouth 2 (two) times daily.   desvenlafaxine  50 MG 24 hr tablet Commonly known as: Pristiq  Take 1 tablet (50 mg total) by mouth daily.   fenofibrate  160 MG tablet TAKE ONE  TABLET BY MOUTH EVERY DAY   hydrocortisone  2.5 % rectal cream Commonly known as: ANUSOL -HC PLACE 1 APPLICATORFUL IN RECTUM 2 TIMES A DAY AS NEEDED FOR HEMORRHOIDS   ibuprofen 200 MG tablet Commonly known as: ADVIL Take 200 mg by mouth every 6 (six) hours as needed.   ketoconazole 2 % cream Commonly known as: NIZORAL Apply 1 Application topically daily as needed for irritation.   levothyroxine  137 MCG tablet Commonly known as: SYNTHROID  TAKE ONE TABLET DAILY EXCEPT ON SATURDAY AND SUNDAY What changed:  how much to take how to take this when to take this   losartan -hydrochlorothiazide  50-12.5 MG tablet Commonly known as: HYZAAR Take 1 tablet by mouth daily.   metoprolol succinate 25 MG 24 hr tablet Commonly known as: TOPROL-XL Take 1 tablet (25 mg total) by mouth daily.   pantoprazole  40 MG tablet Commonly known as: PROTONIX  Take 1 tablet (40 mg total) by mouth daily.   rosuvastatin  40 MG tablet Commonly known as: CRESTOR  Take 1 tablet (40 mg total) by mouth daily.   triamcinolone  0.025 % cream Commonly known as: KENALOG  Apply topically daily.   Vitamin D3 50 MCG (2000 UT) Tabs Take by mouth.   zolpidem  10 MG tablet Commonly known as: AMBIEN  TAKE ONE-HALF TO 1 TABLET AT BEDTIME AS NEEDED FOR SLEEP           Outstanding Labs/Studies    Duration of Discharge Encounter: APP Time: 25 minutes   Signed, Debria Fang, PA-C 01/23/2024, 10:34 AM  Patient seen and examined, note reviewed with the signed Advanced Practice Provider. I personally reviewed laboratory data, imaging studies and relevant notes. I independently examined the patient and formulated the important aspects of the plan. I have personally discussed the plan with the patient and/or family. Comments or changes to the note/plan are indicated below.  Seen and examined at his bedside - wife at bedside. No complaints looking forward to going home  Gen:NAD Chest:CTA Cardio: S1s2 noted Abd:  soft, NT, ND  LE: No edema  Chest pain - resolved - LHC no obstructive plaques  SVT - in SR will wear ambulatory monitor - cont current dose of beta blocker  AKI - improve HTN - continue current antihypertensive   Jerryl Morin DO, MS [facility] Attending Cardiologist [facility]  [address] [place], [place] [address] [phone] Website: [email or web address]

## 2024-01-23 NOTE — Progress Notes (Unsigned)
 Enrolled for [employer] to mail a ZIO XT long term holter monitor to the patients address on file.  ? ?Dr. Antoine Poche to read. ?

## 2024-01-23 NOTE — Plan of Care (Signed)
  Problem: Activity: Goal: Ability to return to baseline activity level will improve Outcome: Progressing   Problem: Cardiovascular: Goal: Vascular access site(s) Level 0-1 will be maintained Outcome: Progressing   Problem: Education: Goal: Knowledge of General Education information will improve Description: Including pain rating scale, medication(s)/side effects and non-pharmacologic comfort measures Outcome: Progressing   Problem: Clinical Measurements: Goal: Cardiovascular complication will be avoided Outcome: Progressing   Problem: Activity: Goal: Risk for activity intolerance will decrease Outcome: Progressing

## 2024-01-23 NOTE — Plan of Care (Signed)
  Problem: Education: Goal: Understanding of CV disease, CV risk reduction, and recovery process will improve Outcome: Adequate for Discharge Goal: Individualized Educational Video(s) Outcome: Adequate for Discharge   Problem: Activity: Goal: Ability to return to baseline activity level will improve Outcome: Adequate for Discharge   Problem: Cardiovascular: Goal: Ability to achieve and maintain adequate cardiovascular perfusion will improve Outcome: Adequate for Discharge Goal: Vascular access site(s) Level 0-1 will be maintained Outcome: Adequate for Discharge   Problem: Health Behavior/Discharge Planning: Goal: Ability to safely manage health-related needs after discharge will improve Outcome: Adequate for Discharge   Problem: Education: Goal: Knowledge of General Education information will improve Description: Including pain rating scale, medication(s)/side effects and non-pharmacologic comfort measures Outcome: Adequate for Discharge   Problem: Health Behavior/Discharge Planning: Goal: Ability to manage health-related needs will improve Outcome: Adequate for Discharge   Problem: Clinical Measurements: Goal: Ability to maintain clinical measurements within normal limits will improve Outcome: Adequate for Discharge Goal: Will remain free from infection Outcome: Adequate for Discharge Goal: Diagnostic test results will improve Outcome: Adequate for Discharge Goal: Respiratory complications will improve Outcome: Adequate for Discharge Goal: Cardiovascular complication will be avoided Outcome: Adequate for Discharge   Problem: Activity: Goal: Risk for activity intolerance will decrease Outcome: Adequate for Discharge   Problem: Nutrition: Goal: Adequate nutrition will be maintained Outcome: Adequate for Discharge   Problem: Coping: Goal: Level of anxiety will decrease Outcome: Adequate for Discharge   Problem: Elimination: Goal: Will not experience complications  related to bowel motility Outcome: Adequate for Discharge Goal: Will not experience complications related to urinary retention Outcome: Adequate for Discharge   Problem: Pain Managment: Goal: General experience of comfort will improve and/or be controlled Outcome: Adequate for Discharge   Problem: Safety: Goal: Ability to remain free from injury will improve Outcome: Adequate for Discharge   Problem: Skin Integrity: Goal: Risk for impaired skin integrity will decrease Outcome: Adequate for Discharge   Problem: Education: Goal: Understanding of cardiac disease, CV risk reduction, and recovery process will improve Outcome: Adequate for Discharge Goal: Individualized Educational Video(s) Outcome: Adequate for Discharge   Problem: Activity: Goal: Ability to tolerate increased activity will improve Outcome: Adequate for Discharge   Problem: Cardiac: Goal: Ability to achieve and maintain adequate cardiovascular perfusion will improve Outcome: Adequate for Discharge   Problem: Health Behavior/Discharge Planning: Goal: Ability to safely manage health-related needs after discharge will improve Outcome: Adequate for Discharge

## 2024-01-24 ENCOUNTER — Telehealth: Payer: Self-pay

## 2024-01-24 LAB — LIPOPROTEIN A (LPA): Lipoprotein (a): 18.3 nmol/L (ref <75.0)

## 2024-01-24 NOTE — Transitions of Care (Post Inpatient/ED Visit) (Signed)
 01/24/2024  Name: Kenneth [name] MRN: [id number] DOB: 03-21-1966  Today's TOC FU Call Status: Today's TOC FU Call Status:: Successful TOC FU Call Completed TOC FU Call Complete Date: 01/24/24 Patient's Name and Date of Birth confirmed.  Transition Care Management Follow-up Telephone Call Date of Discharge: 01/23/24 Discharge Facility: [facility] Type of Discharge: Inpatient Admission Primary Inpatient Discharge Diagnosis:: NSTEMI How have you been since you were released from the hospital?: Better Any questions or concerns?: No  Items Reviewed: Did you receive and understand the discharge instructions provided?: Yes Medications obtained,verified, and reconciled?: Yes (Medications Reviewed) Any new allergies since your discharge?: No Dietary orders reviewed?: Yes Do you have support at home?: Yes People in Home [RPT]: spouse  Medications Reviewed Today: Medications Reviewed Today     Reviewed by Darrall Ellison, LPN (Licensed Practical Nurse) on 01/24/24 at [phone]  Med List Status: <None>   Medication Order Taking? Sig Documenting Provider Last Dose Status Informant  ALPRAZolam  (XANAX ) 1 MG tablet [id number] No Take 0.5-1 tablets (0.5-1 mg total) by mouth 2 (two) times daily as needed for anxiety (place on hold). Vicky Grange M, DO 01/21/2024 Noon Active Self, Spouse/Significant Other, Pharmacy Records  celecoxib  (CELEBREX ) 200 MG capsule [id number] No Take 1 capsule (200 mg total) by mouth 2 (two) times daily.  Patient not taking: Reported on 01/22/2024   Eliodoro Guerin, DO Not Taking Active Self, Spouse/Significant Other, Pharmacy Records           Med Note [place], [place]   Wed Jan 22, 2024  1:22 PM) HOLD  Cholecalciferol (VITAMIN D3) 50 MCG (2000 UT) TABS [id number] No Take by mouth. [provider] 01/21/2024 Morning Active Self, Spouse/Significant Other, Pharmacy Records  desvenlafaxine  (PRISTIQ ) 50 MG 24 hr tablet [id number] No Take 1 tablet (50 mg  total) by mouth daily. Eliodoro Guerin, DO 01/21/2024 Morning Active Self, Spouse/Significant Other, Pharmacy Records  fenofibrate  160 MG tablet [id number] No TAKE ONE TABLET BY MOUTH EVERY DAY Gottschalk, Ashly M, DO 01/21/2024 Morning Active Self, Spouse/Significant Other, Pharmacy Records  hydrocortisone  (ANUSOL -HC) 2.5 % rectal cream [id number] No PLACE 1 APPLICATORFUL IN RECTUM 2 TIMES A DAY AS NEEDED FOR HEMORRHOIDS Vicky Grange M, DO Taking Active Self, Spouse/Significant Other, Pharmacy Records  ibuprofen (ADVIL) 200 MG tablet [id number] No Take 200 mg by mouth every 6 (six) hours as needed. [provider] Taking Active Self, Spouse/Significant Other, Pharmacy Records  ketoconazole (NIZORAL) 2 % cream [id number] No Apply 1 Application topically daily as needed for irritation. [provider] Taking Active Self, Spouse/Significant Other, Pharmacy Records  levothyroxine  (SYNTHROID ) 137 MCG tablet [id number] No TAKE ONE TABLET DAILY EXCEPT ON SATURDAY AND SUNDAY  Patient taking differently: Take 137 mcg by mouth daily before breakfast. TAKE ONE TABLET DAILY EXCEPT ON SATURDAY AND SUNDAY   Vicky Grange M, DO 01/21/2024 Morning Active Self, Spouse/Significant Other, Pharmacy Records  losartan -hydrochlorothiazide  (HYZAAR) 50-12.5 MG tablet [id number] No Take 1 tablet by mouth daily. Vicky Grange M, DO 01/21/2024 Morning Active Self, Spouse/Significant Other, Pharmacy Records  metoprolol succinate (TOPROL-XL) 25 MG 24 hr tablet [id number]  Take 1 tablet (25 mg total) by mouth daily. Debria Fang, PA-C  Active   pantoprazole  (PROTONIX ) 40 MG tablet [id number] No Take 1 tablet (40 mg total) by mouth daily. Tobin Forts, MD 01/21/2024 Morning Active Self, Spouse/Significant Other, Pharmacy Records  rosuvastatin  (CRESTOR ) 40 MG tablet [id number] No Take 1 tablet (40 mg total) by mouth daily. Eliodoro Guerin, DO 01/21/2024 Morning  Active Self, Spouse/Significant Other,  Pharmacy Records  triamcinolone  (KENALOG ) 0.025 % cream [id number] No Apply topically daily. [provider] Taking Active Self, Spouse/Significant Other, Pharmacy Records  zolpidem  (AMBIEN ) 10 MG tablet [id number] No TAKE ONE-HALF TO 1 TABLET AT BEDTIME AS NEEDED FOR SLEEP Gottschalk, Gwendalyn Lemma, DO Past Week Active Self, Spouse/Significant Other, Pharmacy Records            Home Care and Equipment/Supplies: Were Home Health Services Ordered?: NA Any new equipment or medical supplies ordered?: NA  Functional Questionnaire: Do you need assistance with bathing/showering or dressing?: No Do you need assistance with meal preparation?: No Do you need assistance with eating?: No Do you have difficulty maintaining continence: No Do you need assistance with getting out of bed/getting out of a chair/moving?: No Do you have difficulty managing or taking your medications?: No  Follow up appointments reviewed: PCP Follow-up appointment confirmed?: Yes Date of PCP follow-up appointment?: 01/29/24 Follow-up Provider: [facility] Follow-up appointment confirmed?: Yes Date of Specialist follow-up appointment?: 02/07/24 Follow-Up Specialty Provider:: cardio Do you need transportation to your follow-up appointment?: No Do you understand care options if your condition(s) worsen?: Yes-patient verbalized understanding    SIGNATURE Darrall Ellison, LPN [facility] Nurse Health Advisor Direct Dial [phone]

## 2024-01-29 ENCOUNTER — Encounter: Payer: Self-pay | Admitting: Family Medicine

## 2024-01-29 ENCOUNTER — Ambulatory Visit (INDEPENDENT_AMBULATORY_CARE_PROVIDER_SITE_OTHER): Payer: 59 | Admitting: Family Medicine

## 2024-01-29 VITALS — BP 115/77 | HR 68 | Temp 97.9°F | Ht 71.0 in | Wt 182.3 lb

## 2024-01-29 DIAGNOSIS — I2489 Other forms of acute ischemic heart disease: Secondary | ICD-10-CM

## 2024-01-29 DIAGNOSIS — F339 Major depressive disorder, recurrent, unspecified: Secondary | ICD-10-CM

## 2024-01-29 DIAGNOSIS — I10 Essential (primary) hypertension: Secondary | ICD-10-CM

## 2024-01-29 DIAGNOSIS — E782 Mixed hyperlipidemia: Secondary | ICD-10-CM | POA: Diagnosis not present

## 2024-01-29 DIAGNOSIS — F411 Generalized anxiety disorder: Secondary | ICD-10-CM | POA: Diagnosis not present

## 2024-01-29 DIAGNOSIS — Z09 Encounter for follow-up examination after completed treatment for conditions other than malignant neoplasm: Secondary | ICD-10-CM | POA: Diagnosis not present

## 2024-01-29 DIAGNOSIS — I214 Non-ST elevation (NSTEMI) myocardial infarction: Secondary | ICD-10-CM

## 2024-01-29 MED ORDER — LOSARTAN POTASSIUM-HCTZ 50-12.5 MG PO TABS: 1.0000 | ORAL_TABLET | Freq: Every day | ORAL | 3 refills | Status: AC

## 2024-01-29 MED ORDER — DESVENLAFAXINE SUCCINATE ER 50 MG PO TB24: 50.0000 mg | ORAL_TABLET | Freq: Every day | ORAL | 3 refills | Status: DC

## 2024-01-29 MED ORDER — ROSUVASTATIN CALCIUM 40 MG PO TABS: 40.0000 mg | ORAL_TABLET | Freq: Every day | ORAL | 3 refills | Status: AC

## 2024-01-29 NOTE — Progress Notes (Signed)
 Subjective: CC:Hospital discharge follow-up PCP: Eliodoro Guerin, DO [id number] Kenneth [name] is a 58 y.o. male presenting to clinic today for:  1.  Hospital discharge follow-up Patient was hospitalized for what is listed as an NSTEMI but upon further review of the cardiology notes she noted that he had demand ischemia in the setting of possible SVT.  He had a cardiac cath which did not demonstrate any coronary artery disease.  He did have an AKI during the hospitalization but at discharge his creatinine was stable at 1.09.  He was continued on his home medications, beta-blocker was added and he was advised to follow-up with cardiology as an outpatient.  Of note, they did place a Zio patch prior to discharge.  Today he reports that he actually is feeling pretty good on the metoprolol  that he was placed on.  He has an appointment next week with cardiology.   ROS: Per HPI  No Known Allergies Past Medical History:  Diagnosis Date   Anxiety    Complication of anesthesia    combative waking up after back surgery   DDD (degenerative disc disease), lumbar    Depression    Hyperlipidemia    Hypertension    Hypothyroidism     Current Outpatient Medications:    ALPRAZolam  (XANAX ) 1 MG tablet, Take 0.5-1 tablets (0.5-1 mg total) by mouth 2 (two) times daily as needed for anxiety (place on hold)., Disp: 60 tablet, Rfl: 2   celecoxib  (CELEBREX ) 200 MG capsule, Take 1 capsule (200 mg total) by mouth 2 (two) times daily., Disp: 60 capsule, Rfl: 3   Cholecalciferol (VITAMIN D3) 50 MCG (2000 UT) TABS, Take by mouth., Disp: , Rfl:    fenofibrate  160 MG tablet, TAKE ONE TABLET BY MOUTH EVERY DAY, Disp: 90 tablet, Rfl: 5   hydrocortisone  (ANUSOL -HC) 2.5 % rectal cream, PLACE 1 APPLICATORFUL IN RECTUM 2 TIMES A DAY AS NEEDED FOR HEMORRHOIDS, Disp: 30 g, Rfl: 0   ibuprofen (ADVIL) 200 MG tablet, Take 200 mg by mouth every 6 (six) hours as needed., Disp: , Rfl:    ketoconazole (NIZORAL) 2 % cream, Apply  1 Application topically daily as needed for irritation., Disp: , Rfl:    levothyroxine  (SYNTHROID ) 137 MCG tablet, TAKE ONE TABLET DAILY EXCEPT ON SATURDAY AND SUNDAY (Patient taking differently: Take 137 mcg by mouth daily before breakfast. TAKE ONE TABLET DAILY EXCEPT ON SATURDAY AND SUNDAY), Disp: 90 tablet, Rfl: 5   metoprolol  succinate (TOPROL -XL) 25 MG 24 hr tablet, Take 1 tablet (25 mg total) by mouth daily., Disp: 90 tablet, Rfl: 3   pantoprazole  (PROTONIX ) 40 MG tablet, Take 1 tablet (40 mg total) by mouth daily., Disp: 30 tablet, Rfl: 11   triamcinolone  (KENALOG ) 0.025 % cream, Apply topically daily., Disp: , Rfl:    zolpidem  (AMBIEN ) 10 MG tablet, TAKE ONE-HALF TO 1 TABLET AT BEDTIME AS NEEDED FOR SLEEP, Disp: 30 tablet, Rfl: 5   desvenlafaxine  (PRISTIQ ) 50 MG 24 hr tablet, Take 1 tablet (50 mg total) by mouth daily., Disp: 90 tablet, Rfl: 3   losartan -hydrochlorothiazide  (HYZAAR) 50-12.5 MG tablet, Take 1 tablet by mouth daily., Disp: 90 tablet, Rfl: 3   rosuvastatin  (CRESTOR ) 40 MG tablet, Take 1 tablet (40 mg total) by mouth daily., Disp: 90 tablet, Rfl: 3 Social History   Socioeconomic History   Marital status: Married    Spouse name: Not on file   Number of children: 4   Years of education: Not on file   Highest education level:  12th grade  Occupational History   Occupation: self employeed  Tobacco Use   Smoking status: Never    Passive exposure: Past   Smokeless tobacco: Never  Vaping Use   Vaping status: Never Used  Substance and Sexual Activity   Alcohol use: No   Drug use: No   Sexual activity: Not on file  Other Topics Concern   Not on file  Social History Narrative   Not on file   Social Drivers of Health   Financial Resource Strain: Low Risk  (10/04/2023)   Overall Financial Resource Strain (CARDIA)    Difficulty of Paying Living Expenses: Not very hard  Food Insecurity: No Food Insecurity (01/23/2024)   Hunger Vital Sign    Worried About Running Out of  Food in the Last Year: Never true    Ran Out of Food in the Last Year: Never true  Transportation Needs: No Transportation Needs (01/23/2024)   PRAPARE - Administrator, Civil Service (Medical): No    Lack of Transportation (Non-Medical): No  Physical Activity: Insufficiently Active (10/04/2023)   Exercise Vital Sign    Days of Exercise per Week: 3 days    Minutes of Exercise per Session: 10 min  Stress: Stress Concern Present (10/04/2023)   [employer] of Occupational Health - Occupational Stress Questionnaire    Feeling of Stress : Rather much  Social Connections: Unknown (10/04/2023)   Social Connection and Isolation Panel [NHANES]    Frequency of Communication with Friends and Family: Three times a week    Frequency of Social Gatherings with Friends and Family: Once a week    Attends Religious Services: Patient declined    Database administrator or Organizations: Patient declined    Attends Banker Meetings: Not on file    Marital Status: Married  Intimate Partner Violence: Not At Risk (01/23/2024)   Humiliation, Afraid, Rape, and Kick questionnaire    Fear of Current or Ex-Partner: No    Emotionally Abused: No    Physically Abused: No    Sexually Abused: No   Family History  Problem Relation Age of Onset   Diabetes Mother    Hypertension Mother    Stroke Mother    Hypertrophic cardiomyopathy Mother    Hyperlipidemia Father    Hypertension Father    Heart attack Father    Diabetes Father    Colon cancer Neg Hx    Stomach cancer Neg Hx     Objective: Office vital signs reviewed. BP 115/77   Pulse 68   Temp 97.9 F (36.6 C)   Ht 5\' 11"  (1.803 m)   Wt 182 lb 4.8 oz (82.7 kg)   SpO2 99%   BMI 25.43 kg/m   Physical Examination:  General: Awake, alert, well nourished, No acute distress HEENT: Sclera white.  Moist mucous membranes Cardio: regular rate and rhythm, S1S2 heard, no murmurs appreciated.  Zio patch in place on the left  chest Pulm: clear to auscultation bilaterally, no wheezes, rhonchi or rales; normal work of breathing on room air Extremities: Right radial site without evidence of complication.  He has some healing ecchymosis along the right forearm  Assessment/ Plan: 58 y.o. male   Demand ischemia [facility])  Hospital discharge follow-up  NSTEMI (non-ST elevated myocardial infarction) (HCC)  Depression, recurrent (HCC) - Plan: desvenlafaxine  (PRISTIQ ) 50 MG 24 hr tablet  GAD (generalized anxiety disorder) - Plan: desvenlafaxine  (PRISTIQ ) 50 MG 24 hr tablet  Essential hypertension - Plan: losartan -hydrochlorothiazide  (HYZAAR)  50-12.5 MG tablet  Mixed hyperlipidemia - Plan: rosuvastatin  (CRESTOR ) 40 MG tablet  It sounds like he had demand ischemia secondary to what sounds like SVT.  Has Zio patch in place.  Keep apartment with cardiology.  Continue beta-blocker as prescribed.  Seems to be working well for him so far.  I reviewed all hospital discharge information.  No repeat labs needed.  Pneumococcal vaccination was administered today.  I have renewed his medications.  We did not discuss his depression and anxiety in detail today.  No orders of the defined types were placed in this encounter.  Meds ordered this encounter  Medications   desvenlafaxine  (PRISTIQ ) 50 MG 24 hr tablet    Sig: Take 1 tablet (50 mg total) by mouth daily.    Dispense:  90 tablet    Refill:  3    [email or web address]   losartan -hydrochlorothiazide  (HYZAAR) 50-12.5 MG tablet    Sig: Take 1 tablet by mouth daily.    Dispense:  90 tablet    Refill:  3   rosuvastatin  (CRESTOR ) 40 MG tablet    Sig: Take 1 tablet (40 mg total) by mouth daily.    Dispense:  90 tablet    Refill:  3   Victorian Gunn Bambi Bonine, DO [facility] [phone]

## 2024-02-02 ENCOUNTER — Ambulatory Visit: Payer: Self-pay | Admitting: Cardiology

## 2024-02-04 ENCOUNTER — Telehealth: Payer: Self-pay

## 2024-02-04 NOTE — Telephone Encounter (Signed)
 Spoke with wife  No further questions at this time

## 2024-02-04 NOTE — Telephone Encounter (Signed)
 Copied from CRM [id number]. Topic: Clinical - Medical Advice >> Feb 04, 2024  1:46 PM Carlatta H wrote: Reason for CRM: Patient has questions regarding a follow up appointment//Please return call [phone]

## 2024-02-05 NOTE — Progress Notes (Signed)
 Cardiology Office Note    Date:  02/08/2024  ID:  [name], Schamp Mar 28, 1966, MRN [id number] PCP:  Eliodoro Guerin, DO  Cardiologist:  Eilleen Grates, MD  Electrophysiologist:  None   Chief Complaint: Follow up for palpitations   History of Present Illness: Kenneth [name]    Kenneth [name] is a 58 y.o. male with visit-pertinent history of hypertension, hyperlipidemia, family history of CAD and HOCM, anxiety attacks and hypothyroidism.  Patient was previously seen by [name] in 04/2020 for evaluation of possible carotid stenosis family history of hypertrophic cardiomyopathy and CAD.  Carotid ultrasounds on 04/27/2020 showed no hemodynamically significant stenosis by duplex criteria and extracranial cerebrovascular circulation.  CT calcium  scoring on 05/17/2020 showed coronary calcium  score of 0 agitation units.  Echocardiogram 05/17/2020 showed no evidence of HOCM, EF 60 to 65%, no regional wall motion abnormalities, normal RV systolic function no significant valvular abnormalities.  On 5/14 patient presented to [facility] with complaints of chest pain.  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  Pain describes a burning feeling located in the sternal region radiated to the right side of his chest.  Associated with dyspnea, dizziness and palpitations.  High sensitive troponin 10>38>81.  D-dimer within normal limits.  EKG was without specific ischemic changes.  Patient was transferred to [facility] for cardiac catheterization.  Echocardiogram on 5/14 showed EF 60 to 65%, no RWMA, grade 1 DD, normal RV systolic function, no significant valvular abnormalities.  Patient underwent LHC on 5/14 that showed no coronary artery disease, LVEDP 1 mmHg.  Today he presents for follow up. He reports that he has been doing well, he denies chest pain, shortness of breath, lower extremity edema, orthopnea or PND.  Patient reports that since  starting on metoprolol  he has felt significantly better, he feels that he is sleeping better at night and has more energy throughout the day.  He denies any further palpitations or chest pain.  He does note a slight dizziness with fast position changes or when bending over then standing, reports this resolves after a few seconds, does not feel this is overly bothersome.  He notes that he has resumed regular activity and is tolerating well. ROS: .   Today he denies chest pain, shortness of breath, lower extremity edema, fatigue, palpitations, melena, hematuria, hemoptysis, diaphoresis, weakness, presyncope, syncope, orthopnea, and PND.  All other systems are reviewed and otherwise negative. Studies Reviewed: Kenneth [name]   EKG:  EKG is not ordered today.  CV Studies: Cardiac studies reviewed are outlined and summarized above. Otherwise please see EMR for full report. Cardiac Studies & Procedures   ______________________________________________________________________________________________ CARDIAC CATHETERIZATION  CARDIAC CATHETERIZATION 01/22/2024  Conclusion Coronary angiography 01/22/2024: LM: Normal LAD: Normal. No significant disease. Ramus: Normal. No significant disease. Lcx: Normal. No significant disease. RCA: Normal. No significant disease.  LVEDP 1 mmHg  No coronary artery disease   Manish Corliss Dies, MD  Findings Coronary Findings Diagnostic  Dominance: Right  Left Main Vessel is normal in caliber. Vessel is angiographically normal.  Ramus Intermedius Vessel is normal in caliber. Vessel is angiographically normal.  Left Circumflex Vessel is normal in caliber. Vessel is angiographically normal.  Right Coronary Artery Vessel is normal in caliber. Vessel is angiographically normal.  Intervention  No interventions have been documented.     ECHOCARDIOGRAM  ECHOCARDIOGRAM COMPLETE 01/22/2024  Narrative ECHOCARDIOGRAM REPORT    Patient Name:   Kenneth  James [name] Date  of Exam: 01/22/2024 Medical Rec #:  [id number]        Height:       70.0 in Accession #:    [id number]       Weight:       190.9 lb Date of Birth:  02/18/1966        BSA:          2.047 m Patient Age:    57 years         BP:           109/80 mmHg Patient Gender: M                HR:           69 bpm. Exam Location:  [facility]  Procedure: 2D Echo, 3D Echo, Cardiac Doppler and Color Doppler (Both Spectral and Color Flow Doppler were utilized during procedure).  Indications:    Elevated Troponin  History:        Patient has prior history of Echocardiogram examinations, most recent 05/17/2020. NSTEMI; Risk Factors:Hypertension and Dyslipidemia.  Sonographer:    Denese Finn RCS Referring Phys: [id number] Metta Actis  IMPRESSIONS   1. Left ventricular ejection fraction, by estimation, is 60 to 65%. The left ventricle has normal function. The left ventricle has no regional wall motion abnormalities. Left ventricular diastolic parameters are consistent with Grade I diastolic dysfunction (impaired relaxation). 2. Right ventricular systolic function is normal. The right ventricular size is normal. 3. The mitral valve is normal in structure. Trivial mitral valve regurgitation. No evidence of mitral stenosis. 4. The aortic valve is tricuspid. Aortic valve regurgitation is not visualized. No aortic stenosis is present. 5. The inferior vena cava is normal in size with greater than 50% respiratory variability, suggesting right atrial pressure of 3 mmHg.  FINDINGS Left Ventricle: Left ventricular ejection fraction, by estimation, is 60 to 65%. The left ventricle has normal function. The left ventricle has no regional wall motion abnormalities. The left ventricular internal cavity size was normal in size. There is no left ventricular hypertrophy. Left ventricular diastolic parameters are consistent with Grade I diastolic dysfunction (impaired relaxation).  Right Ventricle: The right ventricular  size is normal. Right vetricular wall thickness was not well visualized. Right ventricular systolic function is normal.  Left Atrium: Left atrial size was normal in size.  Right Atrium: Right atrial size was normal in size.  Pericardium: There is no evidence of pericardial effusion.  Mitral Valve: The mitral valve is normal in structure. Trivial mitral valve regurgitation. No evidence of mitral valve stenosis.  Tricuspid Valve: The tricuspid valve is normal in structure. Tricuspid valve regurgitation is not demonstrated. No evidence of tricuspid stenosis.  Aortic Valve: The aortic valve is tricuspid. Aortic valve regurgitation is not visualized. No aortic stenosis is present. Aortic valve mean gradient measures 3.7 mmHg. Aortic valve peak gradient measures 8.4 mmHg. Aortic valve area, by VTI measures 2.08 cm.  Pulmonic Valve: The pulmonic valve was not well visualized. Pulmonic valve regurgitation is not visualized. No evidence of pulmonic stenosis.  Aorta: The aortic root is normal in size and structure.  Venous: The inferior vena cava is normal in size with greater than 50% respiratory variability, suggesting right atrial pressure of 3 mmHg.  IAS/Shunts: No atrial level shunt detected by color flow Doppler.  Additional Comments: 3D was performed not requiring image post processing on an independent workstation and was normal.   LEFT VENTRICLE PLAX 2D LVIDd:  3.80 cm     Diastology LVIDs:         1.90 cm     LV e' medial:    9.14 cm/s LV PW:         0.90 cm     LV E/e' medial:  7.5 LV IVS:        0.90 cm     LV e' lateral:   9.57 cm/s LVOT diam:     1.90 cm     LV E/e' lateral: 7.2 LV SV:         58 LV SV Index:   29 LVOT Area:     2.84 cm  3D Volume EF: LV Volumes (MOD)           3D EF:        62 % LV vol d, MOD A2C: 71.0 ml LV EDV:       117 ml LV vol d, MOD A4C: 61.0 ml LV ESV:       44 ml LV vol s, MOD A2C: 26.1 ml LV SV:        72 ml LV vol s, MOD A4C: 26.0  ml LV SV MOD A2C:     44.9 ml LV SV MOD A4C:     61.0 ml LV SV MOD BP:      38.8 ml  RIGHT VENTRICLE RV S prime:     13.50 cm/s TAPSE (M-mode): 2.2 cm  LEFT ATRIUM             Index        RIGHT ATRIUM           Index LA diam:        2.80 cm 1.37 cm/m   RA Area:     13.90 cm LA Vol (A2C):   32.5 ml 15.88 ml/m  RA Volume:   34.60 ml  16.91 ml/m LA Vol (A4C):   29.3 ml 14.32 ml/m LA Biplane Vol: 30.9 ml 15.10 ml/m AORTIC VALVE AV Area (Vmax):    2.37 cm AV Area (Vmean):   2.38 cm AV Area (VTI):     2.08 cm AV Vmax:           144.77 cm/s AV Vmean:          87.635 cm/s AV VTI:            0.281 m AV Peak Grad:      8.4 mmHg AV Mean Grad:      3.7 mmHg LVOT Vmax:         121.00 cm/s LVOT Vmean:        73.500 cm/s LVOT VTI:          0.206 m LVOT/AV VTI ratio: 0.73  AORTA Ao Root diam: 3.60 cm  MITRAL VALVE MV Area (PHT): 2.50 cm    SHUNTS MV Decel Time: 303 msec    Systemic VTI:  0.21 m MV E velocity: 68.60 cm/s  Systemic Diam: 1.90 cm MV A velocity: 85.70 cm/s MV E/A ratio:  0.80  Armida Lander MD Electronically signed by Armida Lander MD Signature Date/Time: 01/22/2024/1:10:14 PM    Final      CT SCANS  CT CARDIAC SCORING (SELF PAY ONLY) 05/17/2020  Addendum 05/18/2020  1:48 PM ADDENDUM REPORT: 05/18/2020 13:46  CLINICAL DATA:  Risk stratification  EXAM: Coronary Calcium  Score  TECHNIQUE: The patient was scanned on a Siemens Sensation 16 slice scanner. Axial non-contrast 3mm slices were carried out through the heart.  The data set was analyzed on a dedicated work station and scored using the Agatson method.  FINDINGS: Non-cardiac: See separate report from [facility].  Ascending Aorta: Normal caliber  Pericardium: Normal  IMPRESSION: Coronary calcium  score of 0 Agatston units. This suggests low risk for future cardiac events.  Dalton Mclean   Electronically Signed By: Peder Bourdon M.D. On: 05/18/2020  13:46  Narrative EXAM: OVER-READ INTERPRETATION  CT CHEST  The following report is an over-read performed by radiologist Dr. Alexandria Angel of [facility] on 05/17/2020. This over-read does not include interpretation of cardiac or coronary anatomy or pathology. The coronary calcium  score interpretation by the cardiologist is attached.  COMPARISON:  None.  FINDINGS: Within the visualized portions of the thorax there are no suspicious appearing pulmonary nodules or masses, there is no acute consolidative airspace disease, no pleural effusions, no pneumothorax and no lymphadenopathy. Visualized portions of the upper abdomen demonstrates diffuse low attenuation throughout the visualized hepatic parenchyma. There are no aggressive appearing lytic or blastic lesions noted in the visualized portions of the skeleton.  IMPRESSION: 1. Hepatic steatosis.  Electronically Signed: By: Alexandria Angel M.D. On: 05/17/2020 14:06     ______________________________________________________________________________________________       Current Reported Medications:.    Current Meds  Medication Sig   ALPRAZolam  (XANAX ) 1 MG tablet Take 0.5-1 tablets (0.5-1 mg total) by mouth 2 (two) times daily as needed for anxiety (place on hold).   celecoxib  (CELEBREX ) 200 MG capsule Take 1 capsule (200 mg total) by mouth 2 (two) times daily.   Cholecalciferol (VITAMIN D3) 50 MCG (2000 UT) TABS Take by mouth.   desvenlafaxine  (PRISTIQ ) 50 MG 24 hr tablet Take 1 tablet (50 mg total) by mouth daily.   fenofibrate  160 MG tablet TAKE ONE TABLET BY MOUTH EVERY DAY   hydrocortisone  (ANUSOL -HC) 2.5 % rectal cream PLACE 1 APPLICATORFUL IN RECTUM 2 TIMES A DAY AS NEEDED FOR HEMORRHOIDS   ibuprofen (ADVIL) 200 MG tablet Take 200 mg by mouth every 6 (six) hours as needed.   ketoconazole (NIZORAL) 2 % cream Apply 1 Application topically daily as needed for irritation.   levothyroxine  (SYNTHROID ) 137 MCG  tablet TAKE ONE TABLET DAILY EXCEPT ON SATURDAY AND SUNDAY (Patient taking differently: Take 137 mcg by mouth daily before breakfast. TAKE ONE TABLET DAILY EXCEPT ON SATURDAY AND SUNDAY)   losartan -hydrochlorothiazide  (HYZAAR) 50-12.5 MG tablet Take 1 tablet by mouth daily.   metoprolol  succinate (TOPROL -XL) 25 MG 24 hr tablet Take 1 tablet (25 mg total) by mouth daily.   pantoprazole  (PROTONIX ) 40 MG tablet Take 1 tablet (40 mg total) by mouth daily.   rosuvastatin  (CRESTOR ) 40 MG tablet Take 1 tablet (40 mg total) by mouth daily.   triamcinolone  (KENALOG ) 0.025 % cream Apply topically daily.   zolpidem  (AMBIEN ) 10 MG tablet TAKE ONE-HALF TO 1 TABLET AT BEDTIME AS NEEDED FOR SLEEP    Physical Exam:    VS:  BP 116/80 (BP Location: Left Arm, Patient Position: Sitting, Cuff Size: Normal)   Pulse 75   Ht 5\' 11"  (1.803 m)   Wt 186 lb 3.2 oz (84.5 kg)   SpO2 97%   BMI 25.97 kg/m    Wt Readings from Last 3 Encounters:  02/07/24 186 lb 3.2 oz (84.5 kg)  01/29/24 182 lb 4.8 oz (82.7 kg)  01/22/24 181 lb 14.1 oz (82.5 kg)    GEN: Well nourished, well developed in no acute distress NECK: No JVD; No carotid bruits CARDIAC: RRR,  no murmurs, rubs, gallops, right radial cath site clean and intact without evidence of hematoma.  RESPIRATORY:  Clear to auscultation without rales, wheezing or rhonchi  ABDOMEN: Soft, non-tender, non-distended EXTREMITIES:  No edema; No acute deformity     Asessement and Plan:.    Chest pain: hsTN 38>81>81>57.  Cardiac catheterization showed no coronary artery disease.  Echo showed EF 66 5%, normal RV systolic function.  D-dimer within normal limits.  Today he denies any further chest pain, denies shortness of breath.  He reports that he is feeling very well and and feels improved since starting on metoprolol .  His right radial cath site is clean and intact without evidence of hematoma.  Continue fenofibrate , losartan  hydrochlorothiazide , metoprolol  succinate and Crestor   40 mg daily.  Tachycardia: Patient reported having heart rate of 200 bpm and palpitations during episode of chest pain prior admission.  EKG captured via Apple Watch with suspicious for SVT.  Patient was started on metoprolol  succinate 25 mg daily.  Patient reports that he feels very well overall, feels that his fatigue has improved on metoprolol  and that he is sleeping better at night.  He denies any further palpitations or feelings of chest pain that sent him to the ED previously.  He is currently wearing his cardiac monitor will return on Tuesday.  Continue metoprolol  succinate 25 mg daily.  Hypertension: Blood pressure today 116/80.  Patient notes some slight dizziness and lightheadedness with fast position changes or when going from bending over to standing, notes this resolves within a few seconds.  Denies any presyncope or syncope.  He notes this is not overall bothersome to him.  Encouraged patient to continue monitoring his symptoms and notify us  of any worsening, reviewed orthostatic precautions.  Continue current antihypertensive regimen.  Hyperlipidemia: Lipid panel on admission indicated LDL 97, HDL 41, Josias 171 and total cholesterol 172.  Continue fenofibrate  160 mg daily and Crestor  40 mg daily.   Disposition: F/u with Damontre Millea, NP in three months or sooner if needed.   Signed, Nyia Tsao D Raevon Broom, NP

## 2024-02-07 ENCOUNTER — Encounter: Payer: Self-pay | Admitting: Cardiology

## 2024-02-07 ENCOUNTER — Ambulatory Visit: Attending: Cardiology | Admitting: Cardiology

## 2024-02-07 VITALS — BP 116/80 | HR 75 | Ht 71.0 in | Wt 186.2 lb

## 2024-02-07 DIAGNOSIS — I214 Non-ST elevation (NSTEMI) myocardial infarction: Secondary | ICD-10-CM | POA: Diagnosis not present

## 2024-02-07 DIAGNOSIS — R002 Palpitations: Secondary | ICD-10-CM | POA: Diagnosis not present

## 2024-02-07 DIAGNOSIS — I10 Essential (primary) hypertension: Secondary | ICD-10-CM | POA: Diagnosis not present

## 2024-02-07 DIAGNOSIS — E782 Mixed hyperlipidemia: Secondary | ICD-10-CM | POA: Diagnosis not present

## 2024-02-07 NOTE — Patient Instructions (Signed)
 Medication Instructions:  No changes *If you need a refill on your cardiac medications before your next appointment, please call your pharmacy*  Lab Work: No labs  Testing/Procedures: No testing  Follow-Up: At [facility], you and your health needs are our priority.  As part of our continuing mission to provide you with exceptional heart care, our providers are all part of one team.  This team includes your primary Cardiologist (physician) and Advanced Practice Providers or APPs (Physician Assistants and Nurse Practitioners) who all work together to provide you with the care you need, when you need it.  Your next appointment:   3 month(s)  Provider:   Katlyn West, NP  We recommend signing up for the patient portal called "MyChart".  Sign up information is provided on this After Visit Summary.  MyChart is used to connect with patients for Virtual Visits (Telemedicine).  Patients are able to view lab/test results, encounter notes, upcoming appointments, etc.  Non-urgent messages can be sent to your provider as well.   To learn more about what you can do with MyChart, go to [email or web address].

## 2024-02-08 ENCOUNTER — Encounter: Payer: Self-pay | Admitting: Cardiology

## 2024-02-19 ENCOUNTER — Other Ambulatory Visit: Payer: Self-pay | Admitting: Family Medicine

## 2024-02-19 DIAGNOSIS — M130 Polyarthritis, unspecified: Secondary | ICD-10-CM

## 2024-02-19 DIAGNOSIS — R002 Palpitations: Secondary | ICD-10-CM | POA: Diagnosis not present

## 2024-02-19 DIAGNOSIS — R768 Other specified abnormal immunological findings in serum: Secondary | ICD-10-CM

## 2024-02-20 DIAGNOSIS — R002 Palpitations: Secondary | ICD-10-CM

## 2024-02-21 ENCOUNTER — Ambulatory Visit: Payer: Self-pay | Admitting: Cardiology

## 2024-02-25 NOTE — Telephone Encounter (Signed)
-----   Message from Debria Fang sent at 02/21/2024  7:33 AM EDT ----- Please tell patient that his recent monitor showed predominantly normal sinus rhythm. There were a few runs of atrial tachycardia (fast HR), with the longest only lasting 13 beats. No sustained  arrhythmias   When he saw Katlyn West on 5/30, he reported feeling well without palpitations.   Continue current meds and follow up as arranged  ----- Message ----- From: Eilleen Grates, MD Sent: 02/20/2024   8:04 PM EDT To: Debria Fang, PA-C

## 2024-02-25 NOTE — Telephone Encounter (Signed)
 Left message to call back

## 2024-02-27 NOTE — Telephone Encounter (Signed)
 Pt returning call, requesting cb

## 2024-02-28 NOTE — Telephone Encounter (Signed)
-----   Message from Nurse Aneta Bar sent at 02/28/2024  9:08 AM EDT -----  ----- Message ----- From: Starr Eddy L Sent: 02/27/2024   4:06 PM EDT To: [facility] Triage  ----- Message from April L Harrington sent at 02/27/2024  4:06 PM EDT -----

## 2024-02-28 NOTE — Telephone Encounter (Signed)
 Left message to call back

## 2024-03-03 DIAGNOSIS — M25562 Pain in left knee: Secondary | ICD-10-CM | POA: Diagnosis not present

## 2024-03-04 NOTE — Telephone Encounter (Signed)
 Left message to call back

## 2024-03-05 NOTE — Telephone Encounter (Signed)
 Left message to call back

## 2024-03-05 NOTE — Telephone Encounter (Signed)
-----   Message from Debria Fang sent at 02/21/2024  7:33 AM EDT ----- Please tell patient that his recent monitor showed predominantly normal sinus rhythm. There were a few runs of atrial tachycardia (fast HR), with the longest only lasting 13 beats. No sustained  arrhythmias   When he saw Katlyn West on 5/30, he reported feeling well without palpitations.   Continue current meds and follow up as arranged  ----- Message ----- From: Eilleen Grates, MD Sent: 02/20/2024   8:04 PM EDT To: Debria Fang, PA-C

## 2024-03-10 DIAGNOSIS — M25562 Pain in left knee: Secondary | ICD-10-CM | POA: Diagnosis not present

## 2024-03-17 NOTE — Telephone Encounter (Signed)
-----   Message from Debria Fang sent at 02/21/2024  7:33 AM EDT ----- Please tell patient that his recent monitor showed predominantly normal sinus rhythm. There were a few runs of atrial tachycardia (fast HR), with the longest only lasting 13 beats. No sustained  arrhythmias   When he saw Katlyn West on 5/30, he reported feeling well without palpitations.   Continue current meds and follow up as arranged  ----- Message ----- From: Eilleen Grates, MD Sent: 02/20/2024   8:04 PM EDT To: Debria Fang, PA-C

## 2024-03-17 NOTE — Telephone Encounter (Signed)
 Left message to call back

## 2024-03-19 NOTE — Telephone Encounter (Signed)
 Patient is returning call.

## 2024-03-27 ENCOUNTER — Other Ambulatory Visit: Payer: Self-pay | Admitting: Family Medicine

## 2024-03-27 DIAGNOSIS — F411 Generalized anxiety disorder: Secondary | ICD-10-CM

## 2024-04-06 ENCOUNTER — Ambulatory Visit: Admitting: Neurology

## 2024-04-06 ENCOUNTER — Encounter: Payer: Self-pay | Admitting: Neurology

## 2024-04-06 VITALS — BP 134/81 | HR 110 | Ht 71.0 in | Wt 191.5 lb

## 2024-04-06 DIAGNOSIS — G3184 Mild cognitive impairment, so stated: Secondary | ICD-10-CM | POA: Insufficient documentation

## 2024-04-06 DIAGNOSIS — F419 Anxiety disorder, unspecified: Secondary | ICD-10-CM | POA: Diagnosis not present

## 2024-04-06 DIAGNOSIS — M542 Cervicalgia: Secondary | ICD-10-CM

## 2024-04-06 DIAGNOSIS — R269 Unspecified abnormalities of gait and mobility: Secondary | ICD-10-CM | POA: Diagnosis not present

## 2024-04-06 DIAGNOSIS — G8929 Other chronic pain: Secondary | ICD-10-CM | POA: Insufficient documentation

## 2024-04-06 NOTE — Progress Notes (Signed)
 Chief Complaint  Patient presents with   Memory Loss    Room 13  New patient for memory loss ,pt notice some change in his memory about  1 yr ago, pt had positive ANA , having body aches and weaknees that comes of goes, pt stated that he stop and think before specking    ASSESSMENT AND PLAN  Kenneth [name] is a 58 y.o. [name]   Mild cognitive Impairment  MOCA 28/30  MRI brain  Lab evaluation to rule out of etiology  Gait abnormality, chronic neck pain Chronic low back pain, history of right lumbar radiculopathy  Hyperreflexia, bilateral plantar extensor bilaterally   MRI cervical  MRI lumbar    DIAGNOSTIC DATA (LABS, IMAGING, TESTING) - I reviewed patient records, labs, notes, testing and imaging myself where available.   MEDICAL HISTORY:  Kenneth [name] is a 58 year old [name], seen in request by his rheumatologist Dr. Dolphus Reiter, for evaluation of memory loss, his primary care is Dr. Jolinda, Norene HERO, initial evaluation was on April 06, 2024  History is obtained from the patient and review of electronic medical records. I personally reviewed pertinent available imaging films in PACS.   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, doing Office manager, reported some memory issues difficulty handling his job since 2024, he used to keep his customers' information well by mental memory, now taking frequent notes, often misplace members, when he  read clocks, he often read the time wrong,  There was no family history of memory loss, MoCA examination 28/30  He had history of right lumbar radiculopathy required surgery twice, since 2025, he reported frequent neck pain, wearing soft cervical collar when he felt neck pain, in addition he noticed after intercourse, he also had severe pain, urged to have a bowel movement, lasting 5 to 10 minutes, he has to hold a ice pack sometimes   He recently twisted his left knee  PHYSICAL EXAM:   Vitals:   04/06/24 1523  BP: 134/81  Pulse: (!) 110  Weight: 191 lb 8 oz (86.9 kg)  Height: 5' 11 (1.803 m)    Body mass index is 26.71 kg/m.  PHYSICAL EXAMNIATION:  Gen: NAD, conversant, well nourised, well groomed                     Cardiovascular: Regular rate rhythm, no peripheral edema, warm, nontender. Eyes: Conjunctivae clear without exudates or hemorrhage Neck: Supple, no carotid bruits. Pulmonary: Clear to auscultation bilaterally   NEUROLOGICAL EXAM:  MENTAL STATUS: Speech/cognition: Awake, alert, oriented to history taking and casual conversation    04/06/2024    3:33 PM  Montreal Cognitive Assessment   Visuospatial/ Executive (0/5) 5  Naming (0/3) 3  Attention: Read list of digits (0/2) 2  Attention: Read list of letters (0/1) 1  Attention: Serial 7 subtraction starting at 100 (0/3) 3  Language: Repeat phrase (0/2) 2  Language : Fluency (0/1) 0  Abstraction (0/2) 2  Delayed  Recall (0/5) 4  Orientation (0/6) 6  Total 28    CRANIAL NERVES: CN II: Visual fields are full to confrontation. Pupils are round equal and briskly reactive to light. CN III, IV, VI: extraocular movement are normal. No ptosis. CN V: Facial sensation is intact to light touch CN VII: Face is symmetric with normal eye closure  CN VIII: Hearing is normal to causal conversation. CN IX, X: Phonation is normal. CN XI: Head turning and shoulder shrug are intact  MOTOR: Mild toe flexion and extension  weakness  REFLEXES: Reflexes are 2+ and symmetric at the biceps, triceps, 3/3 knees, and ankles. Plantar responses are extensor bilaterally  SENSORY: Intact to light touch, pinprick and vibratory sensation are intact in fingers and toes.  COORDINATION: There is no trunk or limb dysmetria noted.  GAIT/STANCE: push up, antalgic due to left knee injury  REVIEW OF SYSTEMS:  Full 14 system review of systems performed and notable only for as above All other review of systems were negative.   ALLERGIES: No Known Allergies  HOME MEDICATIONS: Current Outpatient Medications  Medication Sig Dispense Refill   ALPRAZolam  (XANAX ) 1 MG tablet Take 0.5-1 tablets (0.5-1 mg total) by mouth 2 (two) times daily as needed for anxiety (place on hold). 60 tablet 2   Cholecalciferol (VITAMIN D3) 50 MCG (2000 UT) TABS Take by mouth.     desvenlafaxine  (PRISTIQ ) 50 MG 24 hr tablet Take 1 tablet (50 mg total) by mouth daily. 90 tablet 3   fenofibrate  160 MG tablet TAKE ONE TABLET BY MOUTH EVERY DAY 90 tablet 5   hydrocortisone  (ANUSOL -HC) 2.5 % rectal cream PLACE 1 APPLICATORFUL IN RECTUM 2 TIMES A DAY AS NEEDED FOR HEMORRHOIDS 30 g 0   ibuprofen (ADVIL) 200 MG tablet Take 200 mg by mouth every 6 (six) hours as needed.     ketoconazole (NIZORAL) 2 % cream Apply 1 Application topically daily as needed for irritation.     levothyroxine  (SYNTHROID ) 137 MCG tablet TAKE ONE TABLET DAILY EXCEPT ON SATURDAY AND SUNDAY (Patient taking differently: Take 137 mcg by mouth daily before breakfast. TAKE ONE TABLET DAILY EXCEPT ON SATURDAY AND SUNDAY) 90 tablet 5   losartan -hydrochlorothiazide  (HYZAAR) 50-12.5 MG tablet Take 1 tablet by mouth daily. 90 tablet 3   metoprolol  succinate (TOPROL -XL) 25 MG 24 hr tablet Take 1 tablet (25 mg total) by mouth daily. 90 tablet 3   pantoprazole  (PROTONIX ) 40 MG tablet Take 1 tablet (40 mg total) by mouth daily. 30 tablet 11   rosuvastatin  (CRESTOR ) 40 MG tablet Take 1 tablet (40 mg  total) by mouth daily. 90 tablet 3   triamcinolone  (KENALOG ) 0.025 % cream Apply topically daily.     zolpidem  (AMBIEN ) 10 MG tablet TAKE ONE-HALF TO 1 TABLET AT BEDTIME AS NEEDED FOR SLEEP 30 tablet 5   celecoxib  (CELEBREX ) 200 MG capsule TAKE ONE CAPSULE BY MOUTH TWICE DAILY 60 capsule 3   No current facility-administered medications for this visit.    PAST MEDICAL HISTORY: Past Medical History:  Diagnosis Date   Anxiety    Complication of anesthesia    combative waking up after back surgery   DDD (degenerative disc disease), lumbar    Depression    Hyperlipidemia    Hypertension    Hypothyroidism     PAST SURGICAL HISTORY: Past Surgical History:  Procedure Laterality Date   BACK SURGERY     disectomy   LEFT HEART CATH AND CORONARY ANGIOGRAPHY N/A 01/22/2024   Procedure: LEFT HEART  CATH AND CORONARY ANGIOGRAPHY;  Surgeon: Elmira Newman PARAS, MD;  Location: [facility];  Service: Cardiovascular;  Laterality: N/A;   MASS EXCISION Left 01/06/2021   Procedure: EXCISION, CYST, BUTTOCK;  Surgeon: Mavis Anes, MD;  Location: [facility];  Service: General;  Laterality: Left;   THYROIDECTOMY  2010    FAMILY HISTORY: Family History  Problem Relation Age of Onset   Diabetes Mother    Hypertension Mother    Stroke Mother    Hypertrophic cardiomyopathy Mother    Hyperlipidemia Father    Hypertension Father    Heart attack Father    Diabetes Father    Colon cancer Neg Hx    Stomach cancer Neg Hx     SOCIAL HISTORY: Social History   Socioeconomic History   Marital status: Married    Spouse name: Not on file   Number of children: 4   Years of education: Not on file   Highest education level: 12th grade  Occupational History   Occupation: self employeed  Tobacco Use   Smoking status: Never    Passive exposure: Past   Smokeless tobacco: Never  Vaping Use   Vaping status: Never Used  Substance and Sexual Activity   Alcohol use: No   Drug use: No   Sexual  activity: Not on file  Other Topics Concern   Not on file  Social History Narrative   Not on file   Social Drivers of Health   Financial Resource Strain: Low Risk  (10/04/2023)   Overall Financial Resource Strain (CARDIA)    Difficulty of Paying Living Expenses: Not very hard  Food Insecurity: No Food Insecurity (01/23/2024)   Hunger Vital Sign    Worried About Running Out of Food in the Last Year: Never true    Ran Out of Food in the Last Year: Never true  Transportation Needs: No Transportation Needs (01/23/2024)   PRAPARE - Administrator, Civil Service (Medical): No    Lack of Transportation (Non-Medical): No  Physical Activity: Insufficiently Active (10/04/2023)   Exercise Vital Sign    Days of Exercise per Week: 3 days    Minutes of Exercise per Session: 10 min  Stress: Stress Concern Present (10/04/2023)   [employer] of Occupational Health - Occupational Stress Questionnaire    Feeling of Stress : Rather much  Social Connections: Unknown (10/04/2023)   Social Connection and Isolation Panel    Frequency of Communication with Friends and Family: Three times a week    Frequency of Social Gatherings with Friends and Family: Once a week    Attends Religious Services: Patient declined    Database administrator or Organizations: Patient declined    Attends Banker Meetings: Not on file    Marital Status: Married  Intimate Partner Violence: Not At Risk (01/23/2024)   Humiliation, Afraid, Rape, and Kick questionnaire    Fear of Current or Ex-Partner: No    Emotionally Abused: No    Physically Abused: No    Sexually Abused: No      Modena Callander, M.D. Ph.D.  [facility] [address] [place], [place] [address] Ph: [phone] Fax: [phone]  CC:  Dolphus Reiter, MD [address] [place],  [place] [address]  [name], DO

## 2024-04-07 ENCOUNTER — Telehealth: Payer: Self-pay | Admitting: Neurology

## 2024-04-07 DIAGNOSIS — M25562 Pain in left knee: Secondary | ICD-10-CM | POA: Diagnosis not present

## 2024-04-07 LAB — THYROID PANEL WITH TSH
Free Thyroxine Index: 2.6 (ref 1.2–4.9)
T3 Uptake Ratio: 27 % (ref 24–39)
T4, Total: 9.5 ug/dL (ref 4.5–12.0)
TSH: 8.03 u[IU]/mL — ABNORMAL HIGH (ref 0.450–4.500)

## 2024-04-07 LAB — RPR: RPR Ser Ql: NONREACTIVE

## 2024-04-07 LAB — VITAMIN B12: Vitamin B-12: 545 pg/mL (ref 232–1245)

## 2024-04-07 NOTE — Telephone Encounter (Signed)
 sent to GI they obtain Drucie Opitz [phone]

## 2024-04-09 ENCOUNTER — Ambulatory Visit: Payer: Self-pay | Admitting: Neurology

## 2024-04-10 ENCOUNTER — Encounter: Payer: Self-pay | Admitting: Neurology

## 2024-04-13 ENCOUNTER — Encounter: Payer: Self-pay | Admitting: Neurology

## 2024-04-14 ENCOUNTER — Encounter: Payer: Self-pay | Admitting: Neurology

## 2024-04-14 ENCOUNTER — Other Ambulatory Visit: Payer: Self-pay | Admitting: Family Medicine

## 2024-04-14 DIAGNOSIS — F5101 Primary insomnia: Secondary | ICD-10-CM

## 2024-04-16 ENCOUNTER — Encounter: Payer: Self-pay | Admitting: Neurology

## 2024-04-16 NOTE — Telephone Encounter (Signed)
 His insurance approved the brain and lumbar MRI but they are denying the cervical MRI. He is currently scheduled for all three on 8/12 at [facility]. If you'd like to do a peer to peer for him please call [phone] opt. 1 case #[id number]

## 2024-04-17 NOTE — Telephone Encounter (Signed)
 Called and scheduled 04/20/24 for 4:45pm and they will call dr. Onita on her cell phone number

## 2024-04-20 ENCOUNTER — Telehealth: Payer: Self-pay | Admitting: Neurology

## 2024-04-20 NOTE — Telephone Encounter (Signed)
 I let GI know the MRI is approved and he is still on the schedule for tomorrow.

## 2024-04-20 NOTE — Telephone Encounter (Signed)
 I did peer to peer review, for his evaluation of gait abnormality, chronic neck pain, hyperreflexia on exam  Approval for MRI cervical [id number] until Feb 2026.

## 2024-04-20 NOTE — Telephone Encounter (Signed)
 Dr. Onita has a peer to peer scheduled for this afternoon and there's a note on the appt that GI is aware.

## 2024-04-20 NOTE — Telephone Encounter (Signed)
 Christi From [employer] called to inform that 3 MRI was placed for PT to get. However one of the order were denied ,Arline was calling  to follow up to see if order will be resent .  Called back is  [phone] ext.[phone]

## 2024-04-21 ENCOUNTER — Ambulatory Visit
Admission: RE | Admit: 2024-04-21 | Discharge: 2024-04-21 | Disposition: A | Discharge status: Home / Self Care | Source: Ambulatory Visit | Attending: Neurology | Admitting: Neurology

## 2024-04-21 DIAGNOSIS — F419 Anxiety disorder, unspecified: Secondary | ICD-10-CM

## 2024-04-21 DIAGNOSIS — G3184 Mild cognitive impairment, so stated: Secondary | ICD-10-CM | POA: Diagnosis not present

## 2024-04-21 DIAGNOSIS — R269 Unspecified abnormalities of gait and mobility: Secondary | ICD-10-CM

## 2024-04-21 MED ORDER — GADOPICLENOL 0.5 MMOL/ML IV SOLN
8.5000 mL | Freq: Once | INTRAVENOUS | Status: AC | PRN
  Administered 2024-04-21 (×2): 8.5 mL via INTRAVENOUS

## 2024-05-01 ENCOUNTER — Encounter (INDEPENDENT_AMBULATORY_CARE_PROVIDER_SITE_OTHER): Payer: Self-pay | Admitting: Family Medicine

## 2024-05-01 DIAGNOSIS — R768 Other specified abnormal immunological findings in serum: Secondary | ICD-10-CM

## 2024-05-01 DIAGNOSIS — M130 Polyarthritis, unspecified: Secondary | ICD-10-CM

## 2024-05-01 MED ORDER — PREDNISONE 10 MG (21) PO TBPK: ORAL_TABLET | ORAL | 0 refills | Status: DC

## 2024-05-01 NOTE — Telephone Encounter (Signed)
 Please see the MyChart message reply(ies) for my assessment and plan.    This patient gave consent for this Medical Advice Message and is aware that it may result in a bill to [employer], as well as the possibility of receiving a bill for a co-payment or deductible. They are an established patient, but are not seeking medical advice exclusively about a problem treated during an in person or video visit in the last seven days. I did not recommend an in person or video visit within seven days of my reply.    I spent a total of 7 minutes cumulative time within 7 days through [employer].  Norene Fielding, DO

## 2024-05-04 ENCOUNTER — Other Ambulatory Visit

## 2024-05-04 DIAGNOSIS — M130 Polyarthritis, unspecified: Secondary | ICD-10-CM | POA: Diagnosis not present

## 2024-05-04 DIAGNOSIS — R768 Other specified abnormal immunological findings in serum: Secondary | ICD-10-CM | POA: Diagnosis not present

## 2024-05-04 NOTE — Telephone Encounter (Signed)
 I had a detailed conversation with the patient this morning.  He states that he has been taking prednisone  for 3 days now and feels some better.  He states that shoulder improved after the cortisone injection at the last visit.  He states none of his joints are painful but he has muscle pain and fatigue.  He is also not been sleeping well.  He denies any history of joint swelling.  I would have preferred to get some labs prior to starting prednisone  but I advised him to go and get labs today.  I advised him to get following labs which he plans to get through Dr. Carney office.  I advised him to get sedimentation rate, CK, aldolase, ANA, double-stranded DNA, C3-C4.  His TSH was recently elevated which could be contributing to myalgias.  If his labs are normal I would recommend referral to pain management.

## 2024-05-04 NOTE — Telephone Encounter (Signed)
 Spoke with patient's wife Crystal (who is listed on DPR) and advised lab orders were placed to be drawn at Dr. Carney office. Advised her they are in the computer and a total of 6 tests. She verbalized understanding and patient will go today for labs.

## 2024-05-04 NOTE — Addendum Note (Signed)
 Addended by: YVONE DAVED BROCKS on: 05/04/2024 10:04 AM   Modules accepted: Orders

## 2024-05-06 ENCOUNTER — Ambulatory Visit: Payer: Self-pay | Admitting: Rheumatology

## 2024-05-06 LAB — SEDIMENTATION RATE: Sed Rate: 2 mm/h (ref 0–30)

## 2024-05-06 LAB — ALDOLASE: Aldolase: 7 U/L (ref 3.3–10.3)

## 2024-05-06 LAB — ANA: Anti Nuclear Antibody (ANA): NEGATIVE

## 2024-05-06 LAB — C3 AND C4
Complement C3, Serum: 142 mg/dL (ref 82–167)
Complement C4, Serum: 24 mg/dL (ref 12–38)

## 2024-05-06 LAB — ANTI-DNA ANTIBODY, DOUBLE-STRANDED: dsDNA Ab: 1 [IU]/mL (ref 0–9)

## 2024-05-06 LAB — CK: Total CK: 78 U/L (ref 41–331)

## 2024-05-06 NOTE — Progress Notes (Signed)
All the labs are within normal limits.  I will discuss results at the follow-up visit.

## 2024-05-15 ENCOUNTER — Encounter: Payer: Self-pay | Admitting: Cardiology

## 2024-05-15 ENCOUNTER — Ambulatory Visit: Attending: Cardiology | Admitting: Cardiology

## 2024-05-15 VITALS — BP 140/90 | HR 72 | Ht 71.0 in | Wt 193.4 lb

## 2024-05-15 DIAGNOSIS — R002 Palpitations: Secondary | ICD-10-CM

## 2024-05-15 DIAGNOSIS — I10 Essential (primary) hypertension: Secondary | ICD-10-CM | POA: Diagnosis not present

## 2024-05-15 DIAGNOSIS — E782 Mixed hyperlipidemia: Secondary | ICD-10-CM

## 2024-05-15 DIAGNOSIS — G4730 Sleep apnea, unspecified: Secondary | ICD-10-CM | POA: Diagnosis not present

## 2024-05-15 DIAGNOSIS — I214 Non-ST elevation (NSTEMI) myocardial infarction: Secondary | ICD-10-CM | POA: Diagnosis not present

## 2024-05-15 MED ORDER — METOPROLOL SUCCINATE ER 25 MG PO TB24: 12.5000 mg | ORAL_TABLET | Freq: Every day | ORAL | 3 refills | Status: AC

## 2024-05-15 NOTE — Patient Instructions (Signed)
 Medication Instructions:  Your physician has recommended you make the following change in your medication:   REDUCE the Metoprolol  to 1/2 tablet daily  *If you need a refill on your cardiac medications before your next appointment, please call your pharmacy*  Lab Work: None ordered  If you have labs (blood work) drawn today and your tests are completely normal, you will receive your results only by: MyChart Message (if you have MyChart) OR A paper copy in the mail If you have any lab test that is abnormal or we need to change your treatment, we will call you to review the results.  Testing/Procedures: Your physician has recommended that you have a sleep study. This test records several body functions during sleep, including: brain activity, eye movement, oxygen and carbon dioxide blood levels, heart rate and rhythm, breathing rate and rhythm, the flow of air through your mouth and nose, snoring, body muscle movements, and chest and belly movement.   Follow-Up: At [facility], you and your health needs are our priority.  As part of our continuing mission to provide you with exceptional heart care, our providers are all part of one team.  This team includes your primary Cardiologist (physician) and Advanced Practice Providers or APPs (Physician Assistants and Nurse Practitioners) who all work together to provide you with the care you need, when you need it.  Your next appointment:   6 month(s)  Provider:   Lynwood Schilling, MD or Katlyn West, NP          We recommend signing up for the patient portal called MyChart.  Sign up information is provided on this After Visit Summary.  MyChart is used to connect with patients for Virtual Visits (Telemedicine).  Patients are able to view lab/test results, encounter notes, upcoming appointments, etc.  Non-urgent messages can be sent to your provider as well.   To learn more about what you can do with MyChart, go to [email or web address].    Other Instructions

## 2024-05-15 NOTE — Progress Notes (Signed)
 Cardiology Office Note    Date:  05/17/2024  ID:  Kenneth [name], DOB 05-06-1966, MRN [id number] PCP:  [name], DO  Cardiologist:  Lynwood Schilling, MD  Electrophysiologist:  None   Chief Complaint: Follow up for SVT   History of Present Illness: Kenneth [name]    Kenneth [name] is a 59 y.o. male with visit-pertinent history of hypertension, hyperlipidemia, family history of CAD and HOCM, anxiety attacks and hypothyroidism.  Patient was previously seen by Dr. Schilling in 04/2020 for evaluation of possible carotid stenosis family history of hypertrophic cardiomyopathy and CAD.  Carotid ultrasounds on 04/27/2020 showed no hemodynamically significant stenosis by duplex criteria and extracranial cerebrovascular circulation.  CT calcium  scoring on 05/17/2020 showed coronary calcium  score of 0 agitation units.  Echocardiogram 05/17/2020 showed no evidence of HOCM, EF 60 to 65%, no regional wall motion abnormalities, normal RV systolic function no significant valvular abnormalities.  On 5/14 patient presented to [facility] with complaints of chest pain.  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  Pain describes a burning feeling located in the sternal region radiated to the right side of his chest.  Associated with dyspnea, dizziness and palpitations.  High sensitive troponin 10>38>81.  D-dimer within normal limits.  EKG was without specific ischemic changes.  Patient was transferred to [facility] for cardiac catheterization.  Echocardiogram on 5/14 showed EF 60 to 65%, no RWMA, grade 1 DD, normal RV systolic function, no significant valvular abnormalities.  Patient underwent LHC on 5/14 that showed no coronary artery disease, LVEDP 1 mmHg.  Patient was seen in clinic on 02/07/2024 for follow-up, he reported that he was feeling very well and felt improved since starting on metoprolol .  Patient denied any palpitations or feeling of  increased heart rates, was wearing cardiac monitor at that time.  Patient's cardiac monitor showed normal sinus rhythm with a few runs of atrial tachycardia, longest lasting 13 beats.  There were no sustained arrhythmias.  Today he presents for follow-up.  He reports that he has been doing well. He denies any chest pain or shortness of breath, lower extremity edema, orthopnea or pnd.  Patient notes a few short episodes of palpitations, he notes he is able to sit down and take a breath with resolution in symptoms, he notes these are not very similar to his prior episode of SVT.  Patient reports that he has decreased the amount of metoprolol  he is taking as he notes that his resting heart rate was consistently at 50 to 55 bpm, noted increased fatigue with this.  Patient reports significant improvement with decreased dose of metoprolol .  Patient reports that he did not take his blood pressure medication this morning as he forgot to take it.  Patient's wife notes that he has been having intermittent problems with increased joint swelling and episodes of increased fatigue.  She also reports that he snores loudly at night.  He has been evaluated by neurology with no significant findings, sleep study was not ordered.  ROS: .   Today he denies chest pain, shortness of breath, lower extremity edema, melena, hematuria, hemoptysis, diaphoresis, weakness, presyncope, syncope, orthopnea, and PND.  All other systems are reviewed and otherwise negative. Studies Reviewed: Kenneth [name]   EKG:  EKG is not ordered today.  CV Studies: Cardiac studies reviewed are outlined and summarized above. Otherwise please see EMR for full report. Cardiac Studies & Procedures  ______________________________________________________________________________________________ CARDIAC CATHETERIZATION  CARDIAC CATHETERIZATION 01/22/2024  Conclusion Coronary angiography 01/22/2024: LM: Normal LAD: Normal. No significant disease. Ramus: Normal. No  significant disease. Lcx: Normal. No significant disease. RCA: Normal. No significant disease.  LVEDP 1 mmHg  No coronary artery disease   Manish JINNY Lawrence, MD  Findings Coronary Findings Diagnostic  Dominance: Right  Left Main Vessel is normal in caliber. Vessel is angiographically normal.  Ramus Intermedius Vessel is normal in caliber. Vessel is angiographically normal.  Left Circumflex Vessel is normal in caliber. Vessel is angiographically normal.  Right Coronary Artery Vessel is normal in caliber. Vessel is angiographically normal.  Intervention  No interventions have been documented.     ECHOCARDIOGRAM  ECHOCARDIOGRAM COMPLETE 01/22/2024  Narrative ECHOCARDIOGRAM REPORT    [name] Date of Exam: 01/22/2024 Medical Rec #:  [id number]        Height:       70.0 in Accession #:    [id number]       Weight:       190.9 lb Date of Birth:  12-19-1965        BSA:          2.047 m Patient Age:    57 years         BP:           109/80 mmHg Patient Gender: M                HR:           69 bpm. Exam Location:  [facility]  Procedure: 2D Echo, 3D Echo, Cardiac Doppler and Color Doppler (Both Spectral and Color Flow Doppler were utilized during procedure).  Indications:    Elevated Troponin  History:        Patient has prior history of Echocardiogram examinations, most recent 05/17/2020. NSTEMI; Risk Factors:Hypertension and Dyslipidemia.  Sonographer:    Aida Pizza RCS Referring Phys: [id number] LORETTE CINDERELLA KAPUR  IMPRESSIONS   1. Left ventricular ejection fraction, by estimation, is 60 to 65%. The left ventricle has normal function. The left ventricle has no regional wall motion abnormalities. Left ventricular diastolic parameters are consistent with Grade I diastolic dysfunction (impaired relaxation). 2. Right ventricular systolic function is normal. The right ventricular size is normal. 3. The mitral valve is normal in structure.  Trivial mitral valve regurgitation. No evidence of mitral stenosis. 4. The aortic valve is tricuspid. Aortic valve regurgitation is not visualized. No aortic stenosis is present. 5. The inferior vena cava is normal in size with greater than 50% respiratory variability, suggesting right atrial pressure of 3 mmHg.  FINDINGS Left Ventricle: Left ventricular ejection fraction, by estimation, is 60 to 65%. The left ventricle has normal function. The left ventricle has no regional wall motion abnormalities. The left ventricular internal cavity size was normal in size. There is no left ventricular hypertrophy. Left ventricular diastolic parameters are consistent with Grade I diastolic dysfunction (impaired relaxation).  Right Ventricle: The right ventricular size is normal. Right vetricular wall thickness was not well visualized. Right ventricular systolic function is normal.  Left Atrium: Left atrial size was normal in size.  Right Atrium: Right atrial size was normal in size.  Pericardium: There is no evidence of pericardial effusion.  Mitral Valve: The mitral valve is normal in structure. Trivial mitral valve regurgitation. No evidence of mitral valve stenosis.  Tricuspid Valve: The tricuspid valve is normal in structure. Tricuspid valve regurgitation is not demonstrated. No evidence of  tricuspid stenosis.  Aortic Valve: The aortic valve is tricuspid. Aortic valve regurgitation is not visualized. No aortic stenosis is present. Aortic valve mean gradient measures 3.7 mmHg. Aortic valve peak gradient measures 8.4 mmHg. Aortic valve area, by VTI measures 2.08 cm.  Pulmonic Valve: The pulmonic valve was not well visualized. Pulmonic valve regurgitation is not visualized. No evidence of pulmonic stenosis.  Aorta: The aortic root is normal in size and structure.  Venous: The inferior vena cava is normal in size with greater than 50% respiratory variability, suggesting right atrial pressure of 3  mmHg.  IAS/Shunts: No atrial level shunt detected by color flow Doppler.  Additional Comments: 3D was performed not requiring image post processing on an independent workstation and was normal.   LEFT VENTRICLE PLAX 2D LVIDd:         3.80 cm     Diastology LVIDs:         1.90 cm     LV e' medial:    9.14 cm/s LV PW:         0.90 cm     LV E/e' medial:  7.5 LV IVS:        0.90 cm     LV e' lateral:   9.57 cm/s LVOT diam:     1.90 cm     LV E/e' lateral: 7.2 LV SV:         58 LV SV Index:   29 LVOT Area:     2.84 cm  3D Volume EF: LV Volumes (MOD)           3D EF:        62 % LV vol d, MOD A2C: 71.0 ml LV EDV:       117 ml LV vol d, MOD A4C: 61.0 ml LV ESV:       44 ml LV vol s, MOD A2C: 26.1 ml LV SV:        72 ml LV vol s, MOD A4C: 26.0 ml LV SV MOD A2C:     44.9 ml LV SV MOD A4C:     61.0 ml LV SV MOD BP:      38.8 ml  RIGHT VENTRICLE RV S prime:     13.50 cm/s TAPSE (M-mode): 2.2 cm  LEFT ATRIUM             Index        RIGHT ATRIUM           Index LA diam:        2.80 cm 1.37 cm/m   RA Area:     13.90 cm LA Vol (A2C):   32.5 ml 15.88 ml/m  RA Volume:   34.60 ml  16.91 ml/m LA Vol (A4C):   29.3 ml 14.32 ml/m LA Biplane Vol: 30.9 ml 15.10 ml/m AORTIC VALVE AV Area (Vmax):    2.37 cm AV Area (Vmean):   2.38 cm AV Area (VTI):     2.08 cm AV Vmax:           144.77 cm/s AV Vmean:          87.635 cm/s AV VTI:            0.281 m AV Peak Grad:      8.4 mmHg AV Mean Grad:      3.7 mmHg LVOT Vmax:         121.00 cm/s LVOT Vmean:        73.500 cm/s LVOT VTI:  0.206 m LVOT/AV VTI ratio: 0.73  AORTA Ao Root diam: 3.60 cm  MITRAL VALVE MV Area (PHT): 2.50 cm    SHUNTS MV Decel Time: 303 msec    Systemic VTI:  0.21 m MV E velocity: 68.60 cm/s  Systemic Diam: 1.90 cm MV A velocity: 85.70 cm/s MV E/A ratio:  0.80  Dorn Ross MD Electronically signed by Dorn Ross MD Signature Date/Time: 01/22/2024/1:10:14 PM    Final     MONITORS  LONG TERM MONITOR (3-14 DAYS) 02/20/2024  Narrative Normal sinus rhythm Few runs of multifocal atrial tachycardia with the longest being 13 beats. No sustained arrhythmias   CT SCANS  CT CARDIAC SCORING (SELF PAY ONLY) 05/17/2020  Addendum 05/18/2020  1:48 PM ADDENDUM REPORT: 05/18/2020 13:46  CLINICAL DATA:  Risk stratification  EXAM: Coronary Calcium  Score  TECHNIQUE: The patient was scanned on a Siemens Sensation 16 slice scanner. Axial non-contrast 3mm slices were carried out through the heart. The data set was analyzed on a dedicated work station and scored using the Agatson method.  FINDINGS: Non-cardiac: See separate report from [facility].  Ascending Aorta: Normal caliber  Pericardium: Normal  IMPRESSION: Coronary calcium  score of 0 Agatston units. This suggests low risk for future cardiac events.  Dalton Mclean   Electronically Signed By: Ezra Shuck M.D. On: 05/18/2020 13:46  Narrative EXAM: OVER-READ INTERPRETATION  CT CHEST  The following report is an over-read performed by radiologist Dr. Toribio Aye of [facility] on 05/17/2020. This over-read does not include interpretation of cardiac or coronary anatomy or pathology. The coronary calcium  score interpretation by the cardiologist is attached.  COMPARISON:  None.  FINDINGS: Within the visualized portions of the thorax there are no suspicious appearing pulmonary nodules or masses, there is no acute consolidative airspace disease, no pleural effusions, no pneumothorax and no lymphadenopathy. Visualized portions of the upper abdomen demonstrates diffuse low attenuation throughout the visualized hepatic parenchyma. There are no aggressive appearing lytic or blastic lesions noted in the visualized portions of the skeleton.  IMPRESSION: 1. Hepatic steatosis.  Electronically Signed: By: Toribio Aye M.D. On: 05/17/2020 14:06      ______________________________________________________________________________________________       Current Reported Medications:.    Current Meds  Medication Sig   ALPRAZolam  (XANAX ) 1 MG tablet Take 0.5-1 tablets (0.5-1 mg total) by mouth 2 (two) times daily as needed for anxiety (place on hold).   celecoxib  (CELEBREX ) 200 MG capsule TAKE ONE CAPSULE BY MOUTH TWICE DAILY   Cholecalciferol (VITAMIN D3) 50 MCG (2000 UT) TABS Take by mouth.   fenofibrate  160 MG tablet TAKE ONE TABLET BY MOUTH EVERY DAY   hydrocortisone  (ANUSOL -HC) 2.5 % rectal cream PLACE 1 APPLICATORFUL IN RECTUM 2 TIMES A DAY AS NEEDED FOR HEMORRHOIDS   ibuprofen (ADVIL) 200 MG tablet Take 200 mg by mouth every 6 (six) hours as needed.   ketoconazole (NIZORAL) 2 % cream Apply 1 Application topically daily as needed for irritation.   levothyroxine  (SYNTHROID ) 137 MCG tablet TAKE ONE TABLET DAILY EXCEPT ON SATURDAY AND SUNDAY (Patient taking differently: Take 137 mcg by mouth daily before breakfast.)   losartan -hydrochlorothiazide  (HYZAAR) 50-12.5 MG tablet Take 1 tablet by mouth daily.   metoprolol  succinate (TOPROL  XL) 25 MG 24 hr tablet Take 0.5 tablets (12.5 mg total) by mouth daily.   pantoprazole  (PROTONIX ) 40 MG tablet Take 1 tablet (40 mg total) by mouth daily.   rosuvastatin  (CRESTOR ) 40 MG tablet Take 1 tablet (40 mg total) by mouth daily.  triamcinolone  (KENALOG ) 0.025 % cream Apply topically daily.   zolpidem  (AMBIEN ) 10 MG tablet TAKE ONE-HALF TO 1 TABLET AT BEDTIME AS NEEDED FOR SLEEP   [DISCONTINUED] metoprolol  succinate (TOPROL -XL) 25 MG 24 hr tablet Take 1 tablet (25 mg total) by mouth daily.    Physical Exam:    VS:  BP (!) 140/90   Pulse 72   Ht 5' 11 (1.803 m)   Wt 193 lb 6.4 oz (87.7 kg)   SpO2 97%   BMI 26.97 kg/m    Wt Readings from Last 3 Encounters:  05/15/24 193 lb 6.4 oz (87.7 kg)  04/06/24 191 lb 8 oz (86.9 kg)  02/07/24 186 lb 3.2 oz (84.5 kg)    GEN: Well nourished, well  developed in no acute distress NECK: No JVD; No carotid bruits CARDIAC: RRR, no murmurs, rubs, gallops RESPIRATORY:  Clear to auscultation without rales, wheezing or rhonchi  ABDOMEN: Soft, non-tender, non-distended EXTREMITIES:  No edema; No acute deformity     Asessement and Plan:.    Chest pain: hsTN 38>81>81>57.  Cardiac catheterization showed no coronary artery disease.  Echo showed EF 66 5%, normal RV systolic function.  D-dimer within normal limits. Stable with no anginal symptoms. No indication for ischemic evaluation.  Heart healthy diet and regular cardiovascular exercise encouraged.  Continue Hyzaar 50-12.5 mg daily, metoprolol  succinate 12.5 mg daily and Crestor  40 mg daily.  Tachycardia: Patient reported having heart rate of 200 bpm and palpitations during episode of chest pain prior admission.  EKG captured via Apple Watch with suspicious for SVT.  Patient was started on metoprolol  succinate 25 mg daily.  Monitor reassuring as noted above.  Patient notes occasional episodes of palpitations that are infrequent and overall not bothersome, he notes he can sit for a few seconds and take some deep breaths with resolution in symptoms.  He denies any dizziness, lightheadedness, presyncope or syncope.  Patient reports that he has been cutting his metoprolol  in half as he noted that his resting heart rate would be in the low 50s and he was having difficulty with increasing heart rate during exercise.  He reports significant improvement with this reduction, will continue metoprolol  succinate at 12.5 mg daily.  Hypertension: Blood pressure today initially 130/90, on recheck was 140/90.  Patient reports that he has not yet taken his blood pressure medications today, reports he will take when he gets home.  Encouraged patient to monitor his blood pressure at home and notify the office if consistently elevated above 130/80.  Hyperlipidemia: Lipid panel on admission indicated LDL 97, HDL 41,  triglycerides 171 and total cholesterol 172.  Continue fenofibrate  160 mg daily and Crestor  40 mg daily.  Hypothyroidism: Last TSH 8.03, monitored and managed per PCP.   Sleep disordered breathing: Patients wife notes increased snoring at night and patient endorses increased daytime fatigue. STOP Bang score 7.  Patient is agreeable to split-night sleep study.    Disposition: F/u with Keymiah Lyles, NP or Dr. Lavona in 6 months or sooner if needed.   Signed, Christian Borgerding D Rodrigo Mcgranahan, NP

## 2024-05-17 ENCOUNTER — Encounter: Payer: Self-pay | Admitting: Cardiology

## 2024-05-18 ENCOUNTER — Ambulatory Visit (INDEPENDENT_AMBULATORY_CARE_PROVIDER_SITE_OTHER): Admitting: Family Medicine

## 2024-05-18 ENCOUNTER — Encounter: Payer: Self-pay | Admitting: Family Medicine

## 2024-05-18 VITALS — BP 118/78 | HR 90 | Temp 97.8°F | Ht 71.0 in | Wt 191.5 lb

## 2024-05-18 DIAGNOSIS — R5382 Chronic fatigue, unspecified: Secondary | ICD-10-CM

## 2024-05-18 DIAGNOSIS — F5101 Primary insomnia: Secondary | ICD-10-CM | POA: Diagnosis not present

## 2024-05-18 DIAGNOSIS — Z23 Encounter for immunization: Secondary | ICD-10-CM

## 2024-05-18 DIAGNOSIS — E890 Postprocedural hypothyroidism: Secondary | ICD-10-CM | POA: Diagnosis not present

## 2024-05-18 DIAGNOSIS — D352 Benign neoplasm of pituitary gland: Secondary | ICD-10-CM

## 2024-05-18 DIAGNOSIS — F411 Generalized anxiety disorder: Secondary | ICD-10-CM

## 2024-05-18 DIAGNOSIS — Z79899 Other long term (current) drug therapy: Secondary | ICD-10-CM

## 2024-05-18 MED ORDER — QUETIAPINE FUMARATE 50 MG PO TABS: 50.0000 mg | ORAL_TABLET | Freq: Every day | ORAL | 0 refills | Status: DC

## 2024-05-18 MED ORDER — ZOLPIDEM TARTRATE 10 MG PO TABS: 10.0000 mg | ORAL_TABLET | Freq: Every evening | ORAL | 0 refills | Status: DC | PRN

## 2024-05-18 MED ORDER — ALPRAZOLAM 1 MG PO TABS: 0.5000 mg | ORAL_TABLET | Freq: Two times a day (BID) | ORAL | 2 refills | Status: DC | PRN

## 2024-05-18 NOTE — Progress Notes (Signed)
 Subjective: RR:ryzrx up mood PCP: Kenneth Norene HERO, DO YEP:Fpryjzo [name] is a 58 y.o. male presenting to clinic today for:  Discussed the use of AI scribe software for clinical note transcription with the patient, who gave verbal consent to proceed.  History of Present Illness   Kenneth [name] is a 58 year old male who presents with fatigue, social withdrawal, and muscle twitching.  He has been experiencing significant fatigue and social withdrawal, describing a feeling of being 'zoned out' and avoiding social interactions. These symptoms began prior to discontinuing Pristiq , which he has been off for four days following a gradual taper over three weeks. He did not experience withdrawal symptoms during the tapering process.  He reports muscle twitching and cramping, particularly in his arms and legs, which started with the onset of his current symptoms. He has difficulty getting up from the floor due to a lack of strength and often requires assistance from his family.  He has seen both neurology and rheumatology for this issue with normal work up thus far except a small pituitary incidentaloma.   He has a history of thyroid  issues with fluctuating TSH levels. His TSH was high at the end of July. He takes 137 mcg of thyroid  medication daily, ensuring it is on an empty stomach.  No use of biotin containing products  He has been using Ambien  for sleep and has attempted to reduce its use. He feels more rested when he dreams about past events compared to when he uses Ambien . He also occasionally uses Xanax  for panic episodes.  He has a history of taking Lexapro  and Celexa  in the past. He has not been on Prozac , but his son, Kenneth [name], has used it. Kenneth [name] also takes Pristiq .         ROS: Per HPI  No Known Allergies Past Medical History:  Diagnosis Date   Anxiety    Complication of anesthesia    combative waking up after back surgery   DDD (degenerative disc disease), lumbar     Depression    Hyperlipidemia    Hypertension    Hypothyroidism    SVT (supraventricular tachycardia) (HCC)     Current Outpatient Medications:    celecoxib  (CELEBREX ) 200 MG capsule, TAKE ONE CAPSULE BY MOUTH TWICE DAILY, Disp: 60 capsule, Rfl: 3   Cholecalciferol (VITAMIN D3) 50 MCG (2000 UT) TABS, Take by mouth., Disp: , Rfl:    fenofibrate  160 MG tablet, TAKE ONE TABLET BY MOUTH EVERY DAY, Disp: 90 tablet, Rfl: 5   hydrocortisone  (ANUSOL -HC) 2.5 % rectal cream, PLACE 1 APPLICATORFUL IN RECTUM 2 TIMES A DAY AS NEEDED FOR HEMORRHOIDS, Disp: 30 g, Rfl: 0   ibuprofen (ADVIL) 200 MG tablet, Take 200 mg by mouth every 6 (six) hours as needed., Disp: , Rfl:    ketoconazole (NIZORAL) 2 % cream, Apply 1 Application topically daily as needed for irritation., Disp: , Rfl:    levothyroxine  (SYNTHROID ) 137 MCG tablet, TAKE ONE TABLET DAILY EXCEPT ON SATURDAY AND SUNDAY (Patient taking differently: Take 137 mcg by mouth daily before breakfast.), Disp: 90 tablet, Rfl: 5   losartan -hydrochlorothiazide  (HYZAAR) 50-12.5 MG tablet, Take 1 tablet by mouth daily., Disp: 90 tablet, Rfl: 3   metoprolol  succinate (TOPROL  XL) 25 MG 24 hr tablet, Take 0.5 tablets (12.5 mg total) by mouth daily., Disp: 45 tablet, Rfl: 3   pantoprazole  (PROTONIX ) 40 MG tablet, Take 1 tablet (40 mg total) by mouth daily., Disp: 30 tablet, Rfl: 11   QUEtiapine  (  SEROQUEL ) 50 MG tablet, Take 1 tablet (50 mg total) by mouth at bedtime., Disp: 90 tablet, Rfl: 0   rosuvastatin  (CRESTOR ) 40 MG tablet, Take 1 tablet (40 mg total) by mouth daily., Disp: 90 tablet, Rfl: 3   triamcinolone  (KENALOG ) 0.025 % cream, Apply topically daily., Disp: , Rfl:    ALPRAZolam  (XANAX ) 1 MG tablet, Take 0.5-1 tablets (0.5-1 mg total) by mouth 2 (two) times daily as needed for anxiety (place on hold)., Disp: 60 tablet, Rfl: 2   zolpidem  (AMBIEN ) 10 MG tablet, Take 1 tablet (10 mg total) by mouth at bedtime as needed for sleep., Disp: 30 tablet, Rfl: 0 Social  History   Socioeconomic History   Marital status: Married    Spouse name: Not on file   Number of children: 4   Years of education: Not on file   Highest education level: 12th grade  Occupational History   Occupation: self employeed  Tobacco Use   Smoking status: Never    Passive exposure: Past   Smokeless tobacco: Never  Vaping Use   Vaping status: Never Used  Substance and Sexual Activity   Alcohol use: No   Drug use: No   Sexual activity: Not on file  Other Topics Concern   Not on file  Social History Narrative   Not on file   Social Drivers of Health   Financial Resource Strain: Low Risk  (10/04/2023)   Overall Financial Resource Strain (CARDIA)    Difficulty of Paying Living Expenses: Not very hard  Food Insecurity: No Food Insecurity (01/23/2024)   Hunger Vital Sign    Worried About Running Out of Food in the Last Year: Never true    Ran Out of Food in the Last Year: Never true  Transportation Needs: No Transportation Needs (01/23/2024)   PRAPARE - Administrator, Civil Service (Medical): No    Lack of Transportation (Non-Medical): No  Physical Activity: Insufficiently Active (10/04/2023)   Exercise Vital Sign    Days of Exercise per Week: 3 days    Minutes of Exercise per Session: 10 min  Stress: Stress Concern Present (10/04/2023)   [employer] of Occupational Health - Occupational Stress Questionnaire    Feeling of Stress : Rather much  Social Connections: Unknown (10/04/2023)   Social Connection and Isolation Panel    Frequency of Communication with Friends and Family: Three times a week    Frequency of Social Gatherings with Friends and Family: Once a week    Attends Religious Services: Patient declined    Database administrator or Organizations: Patient declined    Attends Banker Meetings: Not on file    Marital Status: Married  Intimate Partner Violence: Not At Risk (01/23/2024)   Humiliation, Afraid, Rape, and Kick  questionnaire    Fear of Current or Ex-Partner: No    Emotionally Abused: No    Physically Abused: No    Sexually Abused: No   Family History  Problem Relation Age of Onset   Diabetes Mother    Hypertension Mother    Stroke Mother    Hypertrophic cardiomyopathy Mother    Hyperlipidemia Father    Hypertension Father    Heart attack Father    Diabetes Father    Colon cancer Neg Hx    Stomach cancer Neg Hx     Objective: Office vital signs reviewed. BP 118/78   Pulse 90   Temp 97.8 F (36.6 C)   Ht 5' 11 (1.803 m)  Wt 191 lb 8 oz (86.9 kg)   SpO2 98%   BMI 26.71 kg/m   Physical Examination:  General: Awake, alert, well nourished, No acute distress HEENT: sclera white, MMM Cardio: regular rate and rhythm, S1S2 heard, no murmurs appreciated Pulm: clear to auscultation bilaterally, no wheezes, rhonchi or rales; normal work of breathing on room air MSK: no joint swelling/ deformity or warmth  Assessment/ Plan: 58 y.o. male   GAD (generalized anxiety disorder) - Plan: ALPRAZolam  (XANAX ) 1 MG tablet, QUEtiapine  (SEROQUEL ) 50 MG tablet, ToxASSURE Select 13 (MW), Urine, Testosterone , CANCELED: ToxASSURE Select 13 (MW), Urine  Primary insomnia - Plan: zolpidem  (AMBIEN ) 10 MG tablet, QUEtiapine  (SEROQUEL ) 50 MG tablet, ToxASSURE Select 13 (MW), Urine, CANCELED: ToxASSURE Select 13 (MW), Urine  Controlled substance agreement signed - Plan: ToxASSURE Select 13 (MW), Urine, CANCELED: ToxASSURE Select 13 (MW), Urine  Postoperative hypothyroidism - Plan: TSH + free T4, CANCELED: TSH + free T4  Chronic fatigue - Plan: Testosterone   Encounter for immunization - Plan: Flu vaccine trivalent PF, 6mos and older(Flulaval,Afluria,Fluarix,Fluzone)  Pituitary microadenoma (HCC)  Assessment and Plan    Hypothyroidism with suboptimal control TSH levels elevated, indicating inadequate levothyroxine  dose. Symptoms of fatigue and mood disturbances likely related to thyroid  control. -  Recheck thyroid  levels. - Consider adding Cytomel if thyroid  levels remain suboptimal. - Consider adjusting levothyroxine  dose if TSH remains high.  Major depressive disorder with anxiety features Persistent symptoms of social avoidance, fatigue, and mood disturbances. Pristiq  discontinued without withdrawal. Discussed mood stabilizers; Seroquel  considered for mood stabilization and sleep, with potential weight gain. - Discontinue Pristiq . - Initiate Seroquel  for mood stabilization and sleep. - Monitor response to Seroquel  and adjust as needed. - Check testosterone  levels  Primary insomnia Difficulty with sleep continuity and quality. Concerns about long-term Ambien  use. Seroquel  considered as alternative. - Initiate Seroquel  for sleep improvement. - Maintain a small supply of Ambien  for occasional use if needed.  Osteoarthritis of spine Chronic condition with symptoms possibly exacerbated by stress and mood disturbances. ? impact on strength and easy fatiguability.  so far rheumatologic and neurologic processes ruled out by specialists.  Pituitary microadenoma, stable, under surveillance Pituitary microadenoma stable under regular surveillance. - Continue regular surveillance.        Norene CHRISTELLA Fielding, DO [facility] [phone]

## 2024-05-19 ENCOUNTER — Other Ambulatory Visit

## 2024-05-19 ENCOUNTER — Ambulatory Visit: Payer: Self-pay | Admitting: Family Medicine

## 2024-05-19 DIAGNOSIS — F411 Generalized anxiety disorder: Secondary | ICD-10-CM

## 2024-05-19 DIAGNOSIS — Z79899 Other long term (current) drug therapy: Secondary | ICD-10-CM

## 2024-05-19 DIAGNOSIS — F5101 Primary insomnia: Secondary | ICD-10-CM | POA: Diagnosis not present

## 2024-05-19 LAB — TSH+FREE T4
Free T4: 1.59 ng/dL (ref 0.82–1.77)
TSH: 2.23 u[IU]/mL (ref 0.450–4.500)

## 2024-05-20 ENCOUNTER — Other Ambulatory Visit

## 2024-05-21 DIAGNOSIS — L814 Other melanin hyperpigmentation: Secondary | ICD-10-CM | POA: Diagnosis not present

## 2024-05-21 DIAGNOSIS — L821 Other seborrheic keratosis: Secondary | ICD-10-CM | POA: Diagnosis not present

## 2024-05-22 LAB — TOXASSURE SELECT 13 (MW), URINE

## 2024-06-19 ENCOUNTER — Ambulatory Visit (INDEPENDENT_AMBULATORY_CARE_PROVIDER_SITE_OTHER): Admitting: Family Medicine

## 2024-06-19 ENCOUNTER — Other Ambulatory Visit: Payer: Self-pay | Admitting: Internal Medicine

## 2024-06-19 ENCOUNTER — Other Ambulatory Visit: Payer: Self-pay | Admitting: Family Medicine

## 2024-06-19 VITALS — BP 116/67 | HR 81 | Temp 97.4°F | Ht 71.0 in | Wt 190.0 lb

## 2024-06-19 DIAGNOSIS — R6882 Decreased libido: Secondary | ICD-10-CM | POA: Diagnosis not present

## 2024-06-19 DIAGNOSIS — F411 Generalized anxiety disorder: Secondary | ICD-10-CM

## 2024-06-19 DIAGNOSIS — F5101 Primary insomnia: Secondary | ICD-10-CM

## 2024-06-19 DIAGNOSIS — M130 Polyarthritis, unspecified: Secondary | ICD-10-CM

## 2024-06-19 DIAGNOSIS — R5383 Other fatigue: Secondary | ICD-10-CM

## 2024-06-19 DIAGNOSIS — Z1211 Encounter for screening for malignant neoplasm of colon: Secondary | ICD-10-CM

## 2024-06-19 DIAGNOSIS — R7689 Other specified abnormal immunological findings in serum: Secondary | ICD-10-CM

## 2024-06-19 MED ORDER — QUETIAPINE FUMARATE 25 MG PO TABS: 12.5000 mg | ORAL_TABLET | Freq: Every day | ORAL | 3 refills | Status: DC

## 2024-06-19 NOTE — Progress Notes (Signed)
 Subjective: CC:f/u GAD PCP: [name], DO YEP:Kenneth [name] is a 58 y.o. male presenting to clinic today for:  GAD/ sleep Pristiq  discontinued.  Seroquel  started 1 month ago he notes that he has had an excellent improvement in sleep and feels like he is getting solid sleep now.  Only taking 12.5mg . He is not needed his Ambien  at all.  He has had some improvement in mood and he is not sure if that is due to the medication or if it is due to improvement in sleep but it is not still fully controlled he continues to have quite a bit of anxiety.  Most of this is situational stress and he is trying to reduce his stressors.  He still wonders if he has an underlying testosterone  issue.  His testosterone  level was checked last time and it was on the low end of normal.  He wants to go ahead and pursue a possible subclinical diagnosis and is asking for referral today.  He continues to have less muscle strength and he thinks he should have, citing that he has difficulty even getting up from the ground easily.  He continues to have quite a bit of fatigue and decreased libido.  He has been evaluated by rheumatology and had a negative workup there   ROS: Per HPI  No Known Allergies Past Medical History:  Diagnosis Date   Anxiety    Complication of anesthesia    combative waking up after back surgery   DDD (degenerative disc disease), lumbar    Depression    Hyperlipidemia    Hypertension    Hypothyroidism    SVT (supraventricular tachycardia)     Current Outpatient Medications:    ALPRAZolam  (XANAX ) 1 MG tablet, Take 0.5-1 tablets (0.5-1 mg total) by mouth 2 (two) times daily as needed for anxiety (place on hold)., Disp: 60 tablet, Rfl: 2   celecoxib  (CELEBREX ) 200 MG capsule, TAKE ONE CAPSULE BY MOUTH TWICE DAILY, Disp: 60 capsule, Rfl: 3   Cholecalciferol (VITAMIN D3) 50 MCG (2000 UT) TABS, Take by mouth., Disp: , Rfl:    fenofibrate  160 MG tablet, TAKE ONE TABLET BY MOUTH EVERY DAY,  Disp: 90 tablet, Rfl: 5   hydrocortisone  (ANUSOL -HC) 2.5 % rectal cream, PLACE 1 APPLICATORFUL IN RECTUM 2 TIMES A DAY AS NEEDED FOR HEMORRHOIDS, Disp: 30 g, Rfl: 0   ibuprofen (ADVIL) 200 MG tablet, Take 200 mg by mouth every 6 (six) hours as needed., Disp: , Rfl:    ketoconazole (NIZORAL) 2 % cream, Apply 1 Application topically daily as needed for irritation., Disp: , Rfl:    levothyroxine  (SYNTHROID ) 137 MCG tablet, TAKE ONE TABLET DAILY EXCEPT ON SATURDAY AND SUNDAY (Patient taking differently: Take 137 mcg by mouth daily before breakfast.), Disp: 90 tablet, Rfl: 5   losartan -hydrochlorothiazide  (HYZAAR) 50-12.5 MG tablet, Take 1 tablet by mouth daily., Disp: 90 tablet, Rfl: 3   metoprolol  succinate (TOPROL  XL) 25 MG 24 hr tablet, Take 0.5 tablets (12.5 mg total) by mouth daily., Disp: 45 tablet, Rfl: 3   pantoprazole  (PROTONIX ) 40 MG tablet, Take 1 tablet (40 mg total) by mouth daily., Disp: 30 tablet, Rfl: 11   QUEtiapine  (SEROQUEL ) 50 MG tablet, Take 1 tablet (50 mg total) by mouth at bedtime., Disp: 90 tablet, Rfl: 0   rosuvastatin  (CRESTOR ) 40 MG tablet, Take 1 tablet (40 mg total) by mouth daily., Disp: 90 tablet, Rfl: 3   triamcinolone  (KENALOG ) 0.025 % cream, Apply topically daily., Disp: , Rfl:  zolpidem  (AMBIEN ) 10 MG tablet, Take 1 tablet (10 mg total) by mouth at bedtime as needed for sleep., Disp: 30 tablet, Rfl: 0 Social History   Socioeconomic History   Marital status: Married    Spouse name: Not on file   Number of children: 4   Years of education: Not on file   Highest education level: 12th grade  Occupational History   Occupation: self employeed  Tobacco Use   Smoking status: Never    Passive exposure: Past   Smokeless tobacco: Never  Vaping Use   Vaping status: Never Used  Substance and Sexual Activity   Alcohol use: No   Drug use: No   Sexual activity: Not on file  Other Topics Concern   Not on file  Social History Narrative   Not on file   Social  Drivers of Health   Financial Resource Strain: Low Risk  (06/19/2024)   Overall Financial Resource Strain (CARDIA)    Difficulty of Paying Living Expenses: Not hard at all  Food Insecurity: No Food Insecurity (06/19/2024)   Hunger Vital Sign    Worried About Running Out of Food in the Last Year: Never true    Ran Out of Food in the Last Year: Never true  Transportation Needs: No Transportation Needs (06/19/2024)   PRAPARE - Administrator, Civil Service (Medical): No    Lack of Transportation (Non-Medical): No  Physical Activity: Insufficiently Active (06/19/2024)   Exercise Vital Sign    Days of Exercise per Week: 2 days    Minutes of Exercise per Session: 20 min  Stress: Stress Concern Present (06/19/2024)   [employer] of Occupational Health - Occupational Stress Questionnaire    Feeling of Stress: Very much  Social Connections: Unknown (06/19/2024)   Social Connection and Isolation Panel    Frequency of Communication with Friends and Family: Once a week    Frequency of Social Gatherings with Friends and Family: Once a week    Attends Religious Services: Patient declined    Database administrator or Organizations: Patient declined    Attends Banker Meetings: Not on file    Marital Status: Married  Intimate Partner Violence: Not At Risk (01/23/2024)   Humiliation, Afraid, Rape, and Kick questionnaire    Fear of Current or Ex-Partner: No    Emotionally Abused: No    Physically Abused: No    Sexually Abused: No   Family History  Problem Relation Age of Onset   Diabetes Mother    Hypertension Mother    Stroke Mother    Hypertrophic cardiomyopathy Mother    Hyperlipidemia Father    Hypertension Father    Heart attack Father    Diabetes Father    Colon cancer Neg Hx    Stomach cancer Neg Hx     Objective: Office vital signs reviewed. BP 116/67   Pulse 81   Temp (!) 97.4 F (36.3 C)   Ht 5' 11 (1.803 m)   Wt 190 lb (86.2 kg)   SpO2  97%   BMI 26.50 kg/m   Physical Examination:  General: Awake, alert, well nourished, No acute distress HEENT: sclera white, MMM Cardio: regular rate and rhythm, S1S2 heard, no murmurs appreciated Pulm: clear to auscultation bilaterally, no wheezes, rhonchi or rales; normal work of breathing on room air Psych: mood stable, speech normal     06/19/2024   11:34 AM 05/18/2024    2:52 PM 01/29/2024    1:08 PM  Depression screen PHQ 2/9  Decreased Interest 2 2 1   Down, Depressed, Hopeless 2 1 1   PHQ - 2 Score 4 3 2   Altered sleeping 1 2 2   Tired, decreased energy 2 2 3   Change in appetite 1 1 0  Feeling bad or failure about yourself  2 0 1  Trouble concentrating 1 0 0  Moving slowly or fidgety/restless 1 1 0  Suicidal thoughts 0 0 0  PHQ-9 Score 12 9 8   Difficult doing work/chores Very difficult Very difficult Somewhat difficult      06/19/2024   11:35 AM 05/18/2024    2:53 PM 01/29/2024    1:08 PM 10/04/2023   10:16 AM  GAD 7 : Generalized Anxiety Score  Nervous, Anxious, on Edge 2 2 2 1   Control/stop worrying 2 3 3 1   Worry too much - different things 2 3 3 1   Trouble relaxing 2 3 2  0  Restless 1 0 0 0  Easily annoyed or irritable 1 1 0 0  Afraid - awful might happen 2 2 2 1   Total GAD 7 Score 12 14 12 4   Anxiety Difficulty Very difficult Very difficult Somewhat difficult Somewhat difficult   Recent Results (from the past 2160 hours)  Vitamin B12     Status: None   Collection Time: 04/06/24  4:21 PM  Result Value Ref Range   Vitamin B-12 545 232 - 1,245 pg/mL  Thyroid  Panel With TSH     Status: Abnormal   Collection Time: 04/06/24  4:21 PM  Result Value Ref Range   TSH 8.030 (H) 0.450 - 4.500 uIU/mL   T4, Total 9.5 4.5 - 12.0 ug/dL   T3 Uptake Ratio 27 24 - 39 %   Free Thyroxine Index 2.6 1.2 - 4.9  RPR     Status: None   Collection Time: 04/06/24  4:21 PM  Result Value Ref Range   RPR Ser Ql Non Reactive Non Reactive  Aldolase     Status: None   Collection Time:  05/04/24  1:28 PM  Result Value Ref Range   Aldolase 7.0 3.3 - 10.3 U/L  Anti-DNA antibody, double-stranded     Status: None   Collection Time: 05/04/24  1:28 PM  Result Value Ref Range   dsDNA Ab 1 0 - 9 IU/mL    Comment:                                    Negative      <5                                    Equivocal  5 - 9                                    Positive      >9   ANA     Status: None   Collection Time: 05/04/24  1:28 PM  Result Value Ref Range   Anti Nuclear Antibody (ANA) Negative Negative  C3 and C4     Status: None   Collection Time: 05/04/24  1:28 PM  Result Value Ref Range   Complement C3, Serum 142 82 - 167 mg/dL   Complement C4, Serum 24 12 - 38 mg/dL  Sedimentation rate     Status: None   Collection Time: 05/04/24  1:28 PM  Result Value Ref Range   Sed Rate 2 0 - 30 mm/hr  CK     Status: None   Collection Time: 05/04/24  1:28 PM  Result Value Ref Range   Total CK 78 41 - 331 U/L  TSH + free T4     Status: None   Collection Time: 05/18/24  3:10 PM  Result Value Ref Range   TSH 2.230 0.450 - 4.500 uIU/mL   Free T4 1.59 0.82 - 1.77 ng/dL  ToxASSURE Select 13 (MW), Urine     Status: None   Collection Time: 05/19/24  9:45 AM  Result Value Ref Range   Summary FINAL     Comment: ==================================================================== ToxASSURE Select 13 (MW) ==================================================================== Test                             Result       Flag       Units  Drug Present and Declared for Prescription Verification   Alprazolam                      108          EXPECTED   ng/mg creat   Alpha-hydroxyalprazolam        103          EXPECTED   ng/mg creat    Source of alprazolam  is a scheduled prescription medication. Alpha-    hydroxyalprazolam is an expected metabolite of alprazolam .  ==================================================================== Test                      Result    Flag   Units      Ref  Range   Creatinine              88               mg/dL      >=79 ==================================================================== Declared Medications:  The flagging and interpretation on this report are based on the  following declared medications.  Unexpected results may arise from  inaccuraci es in the declared medications.   **Note: The testing scope of this panel includes these medications:   Alprazolam  (Xanax )   **Note: The testing scope of this panel does not include the  following reported medications:   Celecoxib  (Celebrex )  Fenofibrate   Hydrochlorothiazide  (Hyzaar)  Hydrocortisone   Ibuprofen (Advil)  Ketoconazole (Nizoral)  Levothyroxine  (Synthroid )  Losartan  (Hyzaar)  Metoprolol  (Toprol )  Pantoprazole  (Protonix )  Rosuvastatin  (Crestor )  Triamcinolone  (Kenalog )  Vitamin D3  Zolpidem  (Ambien ) ==================================================================== For clinical consultation, please call [phone]. ====================================================================       Assessment/ Plan: 58 y.o. male   GAD (generalized anxiety disorder) - Plan: QUEtiapine  (SEROQUEL ) 25 MG tablet  Primary insomnia - Plan: QUEtiapine  (SEROQUEL ) 25 MG tablet  Screen for colon cancer - Plan: Cologuard  Low energy - Plan: Ambulatory referral to Urology  Low libido - Plan: Ambulatory referral to Urology   Seroquel  dose changed. Advised him to challenge himself with full 25mg  to see if mood would improve.  I'd like him to limit his use of benzo if bale.  Referral to urology to eval for possible subclinical hypogonadism.  Cologuard reordered   Norene CHRISTELLA Fielding, DO [facility] [phone]

## 2024-06-22 NOTE — Telephone Encounter (Signed)
 Last OV 06/19/24. Last RF 02/19/24 #60 3RF. Next OV no future OV scheduled at this time

## 2024-06-26 NOTE — Progress Notes (Signed)
 Office Visit Note  Patient: Kenneth [name]             Date of Birth: [birth year]           MRN: [id number]             PCP: Kenneth Norene HERO, DO Referring: Kenneth Norene HERO, DO Visit Date: 07/09/2024 Occupation: Data Unavailable  Subjective:  Discuss lab results  History of Present Illness: Kenneth [name] is a 58 y.o. male with polyarthralgia and positive ANA.  He returns today after his last visit in April 2025.  He had left glenohumeral joint injection on January 08, 2024.  He had some improvement in his left shoulder discomfort.  He has been doing exercises intermittently.  He states he still has some discomfort in his left shoulder when he sleeps on the left side.  He states he has had patellar dislocation in the past and recently had another episode for which she was seen by the orthopedic surgeon.  He was also evaluated by neurology and had MRI of his  which showed possible pituitary microadenoma.  He was also diagnosed with low testosterone .  He continues to have extreme fatigue.MRI of the cervical and lumbar spine showed degenerative changes.  He has chronic discomfort.    Activities of Daily Living:  Patient reports morning stiffness for 3-4 minutes.   Patient Reports nocturnal pain.  Difficulty dressing/grooming: Denies Difficulty climbing stairs: Denies Difficulty getting out of chair: Denies Difficulty using hands for taps, buttons, cutlery, and/or writing: Reports  Review of Systems  Constitutional:  Positive for fatigue.  HENT:  Positive for mouth dryness. Negative for mouth sores.   Eyes:  Negative for dryness.  Respiratory:  Negative for shortness of breath.   Cardiovascular:  Negative for chest pain and palpitations.  Gastrointestinal:  Negative for blood in stool, constipation and diarrhea.  Endocrine: Negative for increased urination.  Genitourinary:  Negative for involuntary urination.  Musculoskeletal:  Positive for joint pain, joint pain, joint  swelling, myalgias, muscle weakness, morning stiffness, muscle tenderness and myalgias. Negative for gait problem.  Skin:  Positive for sensitivity to sunlight. Negative for color change, rash and hair loss.  Allergic/Immunologic: Negative for susceptible to infections.  Neurological:  Negative for dizziness and headaches.  Hematological:  Negative for swollen glands.  Psychiatric/Behavioral:  Positive for depressed mood and sleep disturbance. The patient is nervous/anxious.     PMFS History:  Patient Active Problem List   Diagnosis Date Noted   Mild cognitive impairment 04/06/2024   Anxiety 04/06/2024   Gait abnormality 04/06/2024   Chronic neck pain 04/06/2024   NSTEMI (non-ST elevated myocardial infarction) (HCC) 01/21/2024   Epidermoid cyst    Educated about COVID-19 virus infection 05/03/2020   Stenosis of carotid artery 05/03/2020   Essential hypertension 03/08/2015   GAD (generalized anxiety disorder) 01/28/2014   Depression 01/28/2014   Insomnia 01/28/2014   Hyperlipidemia 01/26/2013   Hypothyroidism 01/26/2013   DDD (degenerative disc disease), lumbar 01/26/2013    Past Medical History:  Diagnosis Date   Anxiety    Complication of anesthesia    combative waking up after back surgery   DDD (degenerative disc disease), lumbar    Depression    Hyperlipidemia    Hypertension    Hypothyroidism    SVT (supraventricular tachycardia)     Family History  Problem Relation Age of Onset   Diabetes Mother    Hypertension Mother    Stroke Mother    Hypertrophic cardiomyopathy  Mother    Hyperlipidemia Father    Hypertension Father    Heart attack Father    Diabetes Father    Colon cancer Neg Hx    Stomach cancer Neg Hx    Past Surgical History:  Procedure Laterality Date   BACK SURGERY     disectomy   LEFT HEART CATH AND CORONARY ANGIOGRAPHY N/A 01/22/2024   Procedure: LEFT HEART CATH AND CORONARY ANGIOGRAPHY;  Surgeon: Elmira Newman PARAS, MD;  Location: [facility]  INVASIVE CV LAB;  Service: Cardiovascular;  Laterality: N/A;   MASS EXCISION Left 01/06/2021   Procedure: EXCISION, CYST, BUTTOCK;  Surgeon: Mavis Anes, MD;  Location: [facility];  Service: General;  Laterality: Left;   THYROIDECTOMY  2010   Social History   Tobacco Use   Smoking status: Never    Passive exposure: Past   Smokeless tobacco: Never  Vaping Use   Vaping status: Never Used  Substance Use Topics   Alcohol use: No   Drug use: No   Social History   Social History Narrative   Not on file     Immunization History  Administered Date(s) Administered   Influenza Split 06/18/2013   Influenza, Seasonal, Injecte, Preservative Fre 05/18/2024   Influenza,inj,Quad PF,6+ Mos 06/05/2016, 06/11/2017, 08/22/2018, 06/23/2021, 07/17/2022   Influenza,inj,quad, With Preservative 06/08/2019   Influenza-Unspecified 08/02/2014, 08/15/2015, 06/08/2019   Moderna Sars-Covid-2 Vaccination 01/04/2020, 02/01/2020, 08/30/2020, 04/11/2021, 11/28/2021   PNEUMOCOCCAL CONJUGATE-20 02/02/2024   Tdap 09/19/2022   Zoster Recombinant(Shingrix) 04/25/2021, 06/23/2021     Objective: Vital Signs: BP (!) 148/91 (BP Location: Left Arm, Patient Position: Sitting, Cuff Size: Small)   Pulse 71   Resp 14   Ht 5' 10 (1.778 m)   Wt 189 lb (85.7 kg)   BMI 27.12 kg/m    Physical Exam Vitals and nursing note reviewed.  Constitutional:      Appearance: He is well-developed.  HENT:     Head: Normocephalic and atraumatic.  Eyes:     Conjunctiva/sclera: Conjunctivae normal.     Pupils: Pupils are equal, round, and reactive to light.  Cardiovascular:     Rate and Rhythm: Normal rate and regular rhythm.     Heart sounds: Normal heart sounds.  Pulmonary:     Effort: Pulmonary effort is normal.     Breath sounds: Normal breath sounds.  Abdominal:     General: Bowel sounds are normal.     Palpations: Abdomen is soft.  Musculoskeletal:     Cervical back: Normal range of motion and neck supple.  Skin:     General: Skin is warm and dry.     Capillary Refill: Capillary refill takes less than 2 seconds.  Neurological:     Mental Status: He is alert and oriented to person, place, and time.     Comments: Bilateral lower extremity brisk DTRs were noted.  Psychiatric:        Behavior: Behavior normal.      Musculoskeletal Exam: He had limited range of motion of the cervical spine with discomfort.  He had painful limited range of motion of the lumbar spine.  There was no SI joint tenderness.  Shoulder joints, elbow joints, wrist joints, MCPs, PIPs and DIPs were in good range of motion with no synovitis.  Hip joints and knee joints were in good range of motion without any warmth swelling or effusion.  There was no tenderness over ankles or MTPs.   CDAI Exam: CDAI Score: -- Patient Global: --; Provider Global: -- Swollen: --;  Tender: -- Joint Exam 07/09/2024   No joint exam has been documented for this visit   There is currently no information documented on the homunculus. Go to the Rheumatology activity and complete the homunculus joint exam.  Investigation: No additional findings.  Imaging: No results found.  Recent Labs: Lab Results  Component Value Date   WBC 10.4 07/07/2024   HGB 17.4 (H) 07/07/2024   PLT 340 07/07/2024   NA 138 07/07/2024   K 3.7 07/07/2024   CL 97 (L) 07/07/2024   CO2 30 07/07/2024   GLUCOSE 96 07/07/2024   BUN 14 07/07/2024   CREATININE 1.07 07/07/2024   BILITOT 1.4 (H) 07/07/2024   ALKPHOS 58 06/25/2023   AST 26 07/07/2024   ALT 42 07/07/2024   PROT 7.2 07/07/2024   ALBUMIN 4.5 06/25/2023   CALCIUM  9.7 07/07/2024   GFRAA 86 08/26/2020   July 07, 2024 RNP negative, dsDNA negative, Smith negative, SSA negative, SSB negative, C3-C4 normal, RF negative  Speciality Comments: No specialty comments available.  Procedures:  No procedures performed Allergies: Patient has no known allergies.   Assessment / Plan:     Visit Diagnoses: Positive ANA  (antinuclear antibody) - Positive ANA low titer and not significant,  July 07, 2024 RNP negative, dsDNA negative, Smith negative, SSA negative, SSB negative, C3-C4 normal, RF negative No clinical features of lupus or related disease.  No further workup needed.  Sicca syndrome - Positive ANA, SSA negative, SSB negative.  Dry mouth and dry eyes related to medication use.  Polyarthralgia - Improved on Celebrex .  Chronic left shoulder pain - Left glenohumeral injection January 08, 2024.  He had good range of motion in his left shoulder today.  He did reports improvement in the shoulder pain.  A handout on exercises was given.  He has not been doing exercise on a regular basis.  Pain in both hands - History of intermittent swelling.  No synovitis was noted on the examination.  X-rays obtained in the past were suggestive of osteoarthritis.  Findings were reviewed.  Myalgia - CK and aldolase normal.  He has difficulty getting up from the squatting position due to lower back pain.  Other fatigue-he continues to have extreme fatigue.  DDD (degenerative disc disease), cervical-MRI done by neurology was reviewed which showed multilevel spondylosis and facet joint arthropathy.  No spinal stenosis was noted.  He has brisk DTRs in his lower extremities.  I advised him to follow-up with neurology due to lower extremity weakness.  Degeneration of intervertebral disc of lumbar region with discogenic back pain and lower extremity pain - Discectomy x 2.  He continues to have lower back pain.  Vitamin D  deficiency  Testosterone  deficiency-patient is concerned about the testosterone  deficiency.  I will refer him to endocrinology.  Pituitary microadenoma (HCC)-was noted on the MRI of the brain.  I will refer him to endocrinology.  Elevated hemoglobin-advised him to have repeat CBC in 1 month with his PCP.  Other medical problems are listed as follows:  NSTEMI (non-ST elevated myocardial infarction) [facility]) -  May 2025  Essential hypertension-blood pressure was elevated at 148/91.  Patient was advised to monitor blood pressure closely and follow-up with his PCP.  Mixed hyperlipidemia  Gastroesophageal reflux disease without esophagitis  Postoperative hypothyroidism  Primary insomnia-sleep hygiene was discussed.  Anxiety and depression  Memory loss  Orders: Orders Placed This Encounter  Procedures   Ambulatory referral to Endocrinology   No orders of the defined types were placed in  this encounter.    Follow-Up Instructions: Return if symptoms worsen or fail to improve, for Osteoarthritis.  MRI report of the brain, cervical spine and lumbar spine were reviewed.  Neurology records were reviewed.   Maya Nash, MD  Note - This record has been created using Animal nutritionist.  Chart creation errors have been sought, but may not always  have been located. Such creation errors do not reflect on  the standard of medical care.

## 2024-06-29 DIAGNOSIS — Z1211 Encounter for screening for malignant neoplasm of colon: Secondary | ICD-10-CM | POA: Diagnosis not present

## 2024-07-04 LAB — COLOGUARD: COLOGUARD: NEGATIVE

## 2024-07-06 ENCOUNTER — Telehealth: Payer: Self-pay

## 2024-07-06 ENCOUNTER — Ambulatory Visit: Payer: Self-pay | Admitting: Family Medicine

## 2024-07-06 NOTE — Telephone Encounter (Signed)
 Patient's wife Crystal (who is listed on DPR) called and states that patient is experiencing another episode like he did in August (for which PCP prescribed prednisone ). He is very fatigued and has worsening joint pain and weakness. Patient's wife states he was advised by Dr. Dolphus to contact our office when this happens again before taking prednisone  so labs could be ordered? Please advise on recommended labs if you would like to order.   Crystal's call back number is [phone].  Patient is scheduled to follow up with you on 07/09/2024.

## 2024-07-07 ENCOUNTER — Other Ambulatory Visit: Payer: Self-pay | Admitting: *Deleted

## 2024-07-07 ENCOUNTER — Other Ambulatory Visit: Payer: Self-pay | Admitting: Rheumatology

## 2024-07-07 DIAGNOSIS — R7689 Other specified abnormal immunological findings in serum: Secondary | ICD-10-CM | POA: Diagnosis not present

## 2024-07-07 NOTE — Telephone Encounter (Signed)
 I pended the labs.  Patient may come in and get lab work.

## 2024-07-07 NOTE — Telephone Encounter (Signed)
 Spoke with patient's wife and advised Dr. Dolphus has put in labs for him to have completed. Patient will go to the [facility] in [place] to have them done today. Orders released.

## 2024-07-09 ENCOUNTER — Ambulatory Visit: Attending: Rheumatology | Admitting: Rheumatology

## 2024-07-09 ENCOUNTER — Encounter: Payer: Self-pay | Admitting: Rheumatology

## 2024-07-09 VITALS — BP 148/91 | HR 71 | Resp 14 | Ht 70.0 in | Wt 189.0 lb

## 2024-07-09 DIAGNOSIS — M51362 Other intervertebral disc degeneration, lumbar region with discogenic back pain and lower extremity pain: Secondary | ICD-10-CM

## 2024-07-09 DIAGNOSIS — M2550 Pain in unspecified joint: Secondary | ICD-10-CM

## 2024-07-09 DIAGNOSIS — M5030 Other cervical disc degeneration, unspecified cervical region: Secondary | ICD-10-CM

## 2024-07-09 DIAGNOSIS — M79642 Pain in left hand: Secondary | ICD-10-CM

## 2024-07-09 DIAGNOSIS — I10 Essential (primary) hypertension: Secondary | ICD-10-CM

## 2024-07-09 DIAGNOSIS — R5383 Other fatigue: Secondary | ICD-10-CM

## 2024-07-09 DIAGNOSIS — D582 Other hemoglobinopathies: Secondary | ICD-10-CM

## 2024-07-09 DIAGNOSIS — R7989 Other specified abnormal findings of blood chemistry: Secondary | ICD-10-CM

## 2024-07-09 DIAGNOSIS — M791 Myalgia, unspecified site: Secondary | ICD-10-CM

## 2024-07-09 DIAGNOSIS — E559 Vitamin D deficiency, unspecified: Secondary | ICD-10-CM

## 2024-07-09 DIAGNOSIS — M25512 Pain in left shoulder: Secondary | ICD-10-CM

## 2024-07-09 DIAGNOSIS — M3500 Sicca syndrome, unspecified: Secondary | ICD-10-CM

## 2024-07-09 DIAGNOSIS — E782 Mixed hyperlipidemia: Secondary | ICD-10-CM

## 2024-07-09 DIAGNOSIS — R413 Other amnesia: Secondary | ICD-10-CM

## 2024-07-09 DIAGNOSIS — D352 Benign neoplasm of pituitary gland: Secondary | ICD-10-CM

## 2024-07-09 DIAGNOSIS — F5101 Primary insomnia: Secondary | ICD-10-CM

## 2024-07-09 DIAGNOSIS — I214 Non-ST elevation (NSTEMI) myocardial infarction: Secondary | ICD-10-CM

## 2024-07-09 DIAGNOSIS — F419 Anxiety disorder, unspecified: Secondary | ICD-10-CM

## 2024-07-09 DIAGNOSIS — M79641 Pain in right hand: Secondary | ICD-10-CM

## 2024-07-09 DIAGNOSIS — E890 Postprocedural hypothyroidism: Secondary | ICD-10-CM

## 2024-07-09 DIAGNOSIS — K219 Gastro-esophageal reflux disease without esophagitis: Secondary | ICD-10-CM

## 2024-07-09 DIAGNOSIS — G8929 Other chronic pain: Secondary | ICD-10-CM

## 2024-07-09 DIAGNOSIS — E349 Endocrine disorder, unspecified: Secondary | ICD-10-CM

## 2024-07-09 DIAGNOSIS — R7689 Other specified abnormal immunological findings in serum: Secondary | ICD-10-CM

## 2024-07-09 DIAGNOSIS — F32A Depression, unspecified: Secondary | ICD-10-CM

## 2024-07-09 NOTE — Patient Instructions (Signed)
 He is repeat CBC in 1 month for elevated hemoglobin

## 2024-07-10 LAB — CBC WITH DIFFERENTIAL/PLATELET
Absolute Lymphocytes: 3713 {cells}/uL (ref 850–3900)
Absolute Monocytes: 718 {cells}/uL (ref 200–950)
Basophils Absolute: 114 {cells}/uL (ref 0–200)
Basophils Relative: 1.1 %
Eosinophils Absolute: 104 {cells}/uL (ref 15–500)
Eosinophils Relative: 1 %
HCT: 51.7 % — ABNORMAL HIGH (ref 38.5–50.0)
Hemoglobin: 17.4 g/dL — ABNORMAL HIGH (ref 13.2–17.1)
MCH: 28.7 pg (ref 27.0–33.0)
MCHC: 33.7 g/dL (ref 32.0–36.0)
MCV: 85.3 fL (ref 80.0–100.0)
MPV: 11 fL (ref 7.5–12.5)
Monocytes Relative: 6.9 %
Neutro Abs: 5751 {cells}/uL (ref 1500–7800)
Neutrophils Relative %: 55.3 %
Platelets: 340 Thousand/uL (ref 140–400)
RBC: 6.06 Million/uL — ABNORMAL HIGH (ref 4.20–5.80)
RDW: 12.4 % (ref 11.0–15.0)
Total Lymphocyte: 35.7 %
WBC: 10.4 Thousand/uL (ref 3.8–10.8)

## 2024-07-10 LAB — COMPREHENSIVE METABOLIC PANEL WITH GFR
AG Ratio: 1.9 (calc) (ref 1.0–2.5)
ALT: 42 U/L (ref 9–46)
AST: 26 U/L (ref 10–35)
Albumin: 4.7 g/dL (ref 3.6–5.1)
Alkaline phosphatase (APISO): 81 U/L (ref 35–144)
BUN: 14 mg/dL (ref 7–25)
CO2: 30 mmol/L (ref 20–32)
Calcium: 9.7 mg/dL (ref 8.6–10.3)
Chloride: 97 mmol/L — ABNORMAL LOW (ref 98–110)
Creat: 1.07 mg/dL (ref 0.70–1.30)
Globulin: 2.5 g/dL (ref 1.9–3.7)
Glucose, Bld: 96 mg/dL (ref 65–139)
Potassium: 3.7 mmol/L (ref 3.5–5.3)
Sodium: 138 mmol/L (ref 135–146)
Total Bilirubin: 1.4 mg/dL — ABNORMAL HIGH (ref 0.2–1.2)
Total Protein: 7.2 g/dL (ref 6.1–8.1)
eGFR: 81 mL/min/1.73m2 (ref >=60)

## 2024-07-10 LAB — ANTI-DNA ANTIBODY, DOUBLE-STRANDED: ds DNA Ab: 1 [IU]/mL

## 2024-07-10 LAB — ANTI-SMITH ANTIBODY: ENA SM Ab Ser-aCnc: <1 AI

## 2024-07-10 LAB — ANTI-NUCLEAR AB-TITER (ANA TITER): ANA Titer 1: 1:320 {titer} — ABNORMAL HIGH

## 2024-07-10 LAB — RNP ANTIBODY: Ribonucleic Protein(ENA) Antibody, IgG: <1 AI

## 2024-07-10 LAB — CYCLIC CITRUL PEPTIDE ANTIBODY, IGG: Cyclic Citrullin Peptide Ab: <16 U

## 2024-07-10 LAB — SEDIMENTATION RATE: Sed Rate: 2 mm/h (ref 0–20)

## 2024-07-10 LAB — RHEUMATOID FACTOR: Rheumatoid fact SerPl-aCnc: <10 [IU]/mL (ref <14)

## 2024-07-10 LAB — SJOGRENS SYNDROME-B EXTRACTABLE NUCLEAR ANTIBODY: SSB (La) (ENA) Antibody, IgG: <1 AI

## 2024-07-10 LAB — C3 AND C4
C3 Complement: 153 mg/dL (ref 82–185)
C4 Complement: 35 mg/dL (ref 15–53)

## 2024-07-10 LAB — SJOGRENS SYNDROME-A EXTRACTABLE NUCLEAR ANTIBODY: SSA (Ro) (ENA) Antibody, IgG: <1 AI

## 2024-07-10 LAB — ANA: Anti Nuclear Antibody (ANA): POSITIVE — AB

## 2024-07-12 ENCOUNTER — Ambulatory Visit: Payer: Self-pay | Admitting: Rheumatology

## 2024-07-12 NOTE — Progress Notes (Signed)
 Hemoglobin is mildly elevated, chloride is low.  ANA remains positive.  All other labs are within normal limits.  Please forward results to his PCP.

## 2024-07-17 ENCOUNTER — Other Ambulatory Visit: Payer: Self-pay | Admitting: *Deleted

## 2024-07-17 DIAGNOSIS — F5101 Primary insomnia: Secondary | ICD-10-CM

## 2024-07-21 ENCOUNTER — Encounter: Payer: Self-pay | Admitting: Family Medicine

## 2024-07-21 ENCOUNTER — Ambulatory Visit (INDEPENDENT_AMBULATORY_CARE_PROVIDER_SITE_OTHER): Admitting: Family Medicine

## 2024-07-21 VITALS — BP 112/75 | HR 66 | Temp 97.4°F | Ht 70.0 in | Wt 189.4 lb

## 2024-07-21 DIAGNOSIS — F339 Major depressive disorder, recurrent, unspecified: Secondary | ICD-10-CM

## 2024-07-21 DIAGNOSIS — F411 Generalized anxiety disorder: Secondary | ICD-10-CM | POA: Diagnosis not present

## 2024-07-21 MED ORDER — SERTRALINE HCL 50 MG PO TABS: 50.0000 mg | ORAL_TABLET | Freq: Every day | ORAL | 0 refills | Status: DC

## 2024-07-21 NOTE — Progress Notes (Signed)
 Subjective: CC: Follow-up anxiety, depression PCP: [name], DO Kenneth [name] is a 58 y.o. male presenting to clinic today for:  Anxiety, depression has really not been modified much with advancing dose of Seroquel  to 25 mg but he does report it was causing excessive daytime sedation so he weaned himself off of the medication.  He has been off of it for several days now.  He has been doing some reading and is interested in maybe switching to SSRI, specifically Zoloft or Celexa .  He can take Benadryl to help with sleep so is not entirely worried about it not being sedating.  He reports ongoing symptoms of agoraphobia and avoidant behaviors in general when it comes to social interaction.   ROS: Per HPI  No Known Allergies Past Medical History:  Diagnosis Date   Anxiety    Complication of anesthesia    combative waking up after back surgery   DDD (degenerative disc disease), lumbar    Depression    Hyperlipidemia    Hypertension    Hypothyroidism    SVT (supraventricular tachycardia)     Current Outpatient Medications:    ALPRAZolam  (XANAX ) 1 MG tablet, Take 0.5-1 tablets (0.5-1 mg total) by mouth 2 (two) times daily as needed for anxiety (place on hold)., Disp: 60 tablet, Rfl: 2   celecoxib  (CELEBREX ) 200 MG capsule, TAKE ONE CAPSULE BY MOUTH TWICE DAILY, Disp: 60 capsule, Rfl: 3   Cholecalciferol (VITAMIN D3) 50 MCG (2000 UT) TABS, Take by mouth., Disp: , Rfl:    fenofibrate  160 MG tablet, TAKE ONE TABLET BY MOUTH EVERY DAY, Disp: 90 tablet, Rfl: 5   hydrocortisone  (ANUSOL -HC) 2.5 % rectal cream, PLACE 1 APPLICATORFUL IN RECTUM 2 TIMES A DAY AS NEEDED FOR HEMORRHOIDS, Disp: 30 g, Rfl: 0   ibuprofen (ADVIL) 200 MG tablet, Take 200 mg by mouth every 6 (six) hours as needed., Disp: , Rfl:    ketoconazole (NIZORAL) 2 % cream, Apply 1 Application topically daily as needed for irritation., Disp: , Rfl:    levothyroxine  (SYNTHROID ) 137 MCG tablet, TAKE ONE TABLET DAILY  EXCEPT ON SATURDAY AND SUNDAY, Disp: 90 tablet, Rfl: 5   losartan -hydrochlorothiazide  (HYZAAR) 50-12.5 MG tablet, Take 1 tablet by mouth daily., Disp: 90 tablet, Rfl: 3   metoprolol  succinate (TOPROL  XL) 25 MG 24 hr tablet, Take 0.5 tablets (12.5 mg total) by mouth daily., Disp: 45 tablet, Rfl: 3   pantoprazole  (PROTONIX ) 40 MG tablet, TAKE ONE TABLET BY MOUTH DAILY, Disp: 90 tablet, Rfl: 1   QUEtiapine  (SEROQUEL ) 25 MG tablet, Take 0.5-1 tablets (12.5-25 mg total) by mouth at bedtime., Disp: 90 tablet, Rfl: 3   rosuvastatin  (CRESTOR ) 40 MG tablet, Take 1 tablet (40 mg total) by mouth daily., Disp: 90 tablet, Rfl: 3   triamcinolone  (KENALOG ) 0.025 % cream, Apply topically daily., Disp: , Rfl:  Social History   Socioeconomic History   Marital status: Married    Spouse name: Not on file   Number of children: 4   Years of education: Not on file   Highest education level: 12th grade  Occupational History   Occupation: self employeed  Tobacco Use   Smoking status: Never    Passive exposure: Past   Smokeless tobacco: Never  Vaping Use   Vaping status: Never Used  Substance and Sexual Activity   Alcohol use: No   Drug use: No   Sexual activity: Not on file  Other Topics Concern   Not on file  Social History Narrative  Not on file   Social Drivers of Health   Financial Resource Strain: Low Risk  (06/19/2024)   Overall Financial Resource Strain (CARDIA)    Difficulty of Paying Living Expenses: Not hard at all  Food Insecurity: No Food Insecurity (06/19/2024)   Hunger Vital Sign    Worried About Running Out of Food in the Last Year: Never true    Ran Out of Food in the Last Year: Never true  Transportation Needs: No Transportation Needs (06/19/2024)   PRAPARE - Administrator, Civil Service (Medical): No    Lack of Transportation (Non-Medical): No  Physical Activity: Insufficiently Active (06/19/2024)   Exercise Vital Sign    Days of Exercise per Week: 2 days     Minutes of Exercise per Session: 20 min  Stress: Stress Concern Present (06/19/2024)   [employer] of Occupational Health - Occupational Stress Questionnaire    Feeling of Stress: Very much  Social Connections: Unknown (06/19/2024)   Social Connection and Isolation Panel    Frequency of Communication with Friends and Family: Once a week    Frequency of Social Gatherings with Friends and Family: Once a week    Attends Religious Services: Patient declined    Database Administrator or Organizations: Patient declined    Attends Banker Meetings: Not on file    Marital Status: Married  Intimate Partner Violence: Not At Risk (01/23/2024)   Humiliation, Afraid, Rape, and Kick questionnaire    Fear of Current or Ex-Partner: No    Emotionally Abused: No    Physically Abused: No    Sexually Abused: No   Family History  Problem Relation Age of Onset   Diabetes Mother    Hypertension Mother    Stroke Mother    Hypertrophic cardiomyopathy Mother    Hyperlipidemia Father    Hypertension Father    Heart attack Father    Diabetes Father    Colon cancer Neg Hx    Stomach cancer Neg Hx     Objective: Office vital signs reviewed. BP 112/75   Pulse 66   Temp (!) 97.4 F (36.3 C)   Ht 5' 10 (1.778 m)   Wt 189 lb 6 oz (85.9 kg)   SpO2 97%   BMI 27.17 kg/m   Physical Examination:  General: Awake, alert, well nourished, No acute distress HEENT: sclera white, MMM     07/21/2024   10:09 AM 06/19/2024   11:34 AM 05/18/2024    2:52 PM  Depression screen PHQ 2/9  Decreased Interest 1 2 2   Down, Depressed, Hopeless 1 2 1   PHQ - 2 Score 2 4 3   Altered sleeping 2 1 2   Tired, decreased energy 3 2 2   Change in appetite 0 1 1  Feeling bad or failure about yourself  3 2 0  Trouble concentrating 0 1 0  Moving slowly or fidgety/restless 0 1 1  Suicidal thoughts 0 0 0  PHQ-9 Score 10 12  9    Difficult doing work/chores Very difficult Very difficult Very difficult      Data saved with a previous flowsheet row definition      07/21/2024   10:09 AM 06/19/2024   11:35 AM 05/18/2024    2:53 PM 01/29/2024    1:08 PM  GAD 7 : Generalized Anxiety Score  Nervous, Anxious, on Edge 1 2 2 2   Control/stop worrying 3 2 3 3   Worry too much - different things 3 2 3  3  Trouble relaxing 2 2 3 2   Restless 0 1 0 0  Easily annoyed or irritable 0 1 1 0  Afraid - awful might happen 3 2 2 2   Total GAD 7 Score 12 12 14 12   Anxiety Difficulty Very difficult Very difficult Very difficult Somewhat difficult    Assessment/ Plan: 58 y.o. male   GAD (generalized anxiety disorder) - Plan: sertraline (ZOLOFT) 50 MG tablet  Depression, recurrent - Plan: sertraline (ZOLOFT) 50 MG tablet    Start Zoloft 50 mg daily.  Discussed potential side effects including GI side effects.  He will message me on MyChart should he have any adverse side effects before our follow-up visit in 5 weeks.  We discussed that may need to consider evaluation with psychiatry for at least medication recommendations if we are not successful as we have exhausted multiple modalities of treatment   Legion Discher CHRISTELLA Fielding, DO [facility] [phone]

## 2024-07-21 NOTE — Patient Instructions (Signed)
Taking the medicine as directed and not missing any doses is one of the best things you can do to treat your depression.  Here are some things to keep in mind:  Side effects (stomach upset, some increased anxiety) may happen before you notice a benefit.  These side effects typically go away over time. Changes to your dose of medicine or a change in medication all together is sometimes necessary Most people need to be on medication at least 12 months Many people will notice an improvement within two weeks but the full effect of the medication can take up to 4-6 weeks Stopping the medication when you start feeling better often results in a return of symptoms Never discontinue your medication without contacting a health care professional first.  Some medications require gradual discontinuation/ taper and can make you sick if you stop them abruptly.  If your symptoms worsen or you have thoughts of suicide/homicide, PLEASE SEEK IMMEDIATE MEDICAL ATTENTION.  You may always call:  National Suicide Hotline: 7542058722 [place]: [phone] [facility] in [place]: 2548116572   These are available 24 hours a day, 7 days a week.

## 2024-08-17 DIAGNOSIS — E890 Postprocedural hypothyroidism: Secondary | ICD-10-CM | POA: Diagnosis not present

## 2024-08-17 DIAGNOSIS — D352 Benign neoplasm of pituitary gland: Secondary | ICD-10-CM | POA: Diagnosis not present

## 2024-08-17 DIAGNOSIS — E7800 Pure hypercholesterolemia, unspecified: Secondary | ICD-10-CM | POA: Diagnosis not present

## 2024-08-17 DIAGNOSIS — I10 Essential (primary) hypertension: Secondary | ICD-10-CM | POA: Diagnosis not present

## 2024-08-17 DIAGNOSIS — N62 Hypertrophy of breast: Secondary | ICD-10-CM | POA: Diagnosis not present

## 2024-08-17 DIAGNOSIS — M791 Myalgia, unspecified site: Secondary | ICD-10-CM | POA: Diagnosis not present

## 2024-08-17 DIAGNOSIS — K760 Fatty (change of) liver, not elsewhere classified: Secondary | ICD-10-CM | POA: Diagnosis not present

## 2024-08-17 DIAGNOSIS — R945 Abnormal results of liver function studies: Secondary | ICD-10-CM | POA: Diagnosis not present

## 2024-08-19 DIAGNOSIS — D352 Benign neoplasm of pituitary gland: Secondary | ICD-10-CM | POA: Diagnosis not present

## 2024-08-19 DIAGNOSIS — N62 Hypertrophy of breast: Secondary | ICD-10-CM | POA: Diagnosis not present

## 2024-08-19 DIAGNOSIS — R945 Abnormal results of liver function studies: Secondary | ICD-10-CM | POA: Diagnosis not present

## 2024-08-19 DIAGNOSIS — M791 Myalgia, unspecified site: Secondary | ICD-10-CM | POA: Diagnosis not present

## 2024-08-19 DIAGNOSIS — E890 Postprocedural hypothyroidism: Secondary | ICD-10-CM | POA: Diagnosis not present

## 2024-08-19 DIAGNOSIS — E291 Testicular hypofunction: Secondary | ICD-10-CM | POA: Diagnosis not present

## 2024-08-25 ENCOUNTER — Encounter: Payer: Self-pay | Admitting: Family Medicine

## 2024-08-25 ENCOUNTER — Ambulatory Visit (INDEPENDENT_AMBULATORY_CARE_PROVIDER_SITE_OTHER): Admitting: Family Medicine

## 2024-08-25 VITALS — BP 118/70 | HR 68 | Temp 97.4°F | Ht 70.0 in | Wt 182.0 lb

## 2024-08-25 DIAGNOSIS — F339 Major depressive disorder, recurrent, unspecified: Secondary | ICD-10-CM

## 2024-08-25 DIAGNOSIS — E291 Testicular hypofunction: Secondary | ICD-10-CM | POA: Diagnosis not present

## 2024-08-25 DIAGNOSIS — F411 Generalized anxiety disorder: Secondary | ICD-10-CM | POA: Diagnosis not present

## 2024-08-25 NOTE — Progress Notes (Signed)
 Subjective: CC: Follow-up mood PCP: [name], DO YEP:Fpryjzo E Soltis is a 58 y.o. male presenting to clinic today for:  We saw her back in November and switched him to Zoloft  50 mg to replace Seroquel  which was causing too much sedation.  He has not noticed a huge difference in his mood but notes that he was recently diagnosed with hypogonadism with his endocrinologist recently and is going to be started on intramuscular testosterone  soon.  He is hoping that this will help with both energy, muscle fatigue and mood.  He does report pride in his son, who suffers from autism disorder, having ventured out on his own to do some social activities.  He is so excited for his son   ROS: Per HPI  Allergies[1] Past Medical History:  Diagnosis Date   Anxiety    Complication of anesthesia    combative waking up after back surgery   DDD (degenerative disc disease), lumbar    Depression    Hyperlipidemia    Hypertension    Hypothyroidism    SVT (supraventricular tachycardia)    Current Medications[2] Social History   Socioeconomic History   Marital status: Married    Spouse name: Not on file   Number of children: 4   Years of education: Not on file   Highest education level: 12th grade  Occupational History   Occupation: self employeed  Tobacco Use   Smoking status: Never    Passive exposure: Past   Smokeless tobacco: Never  Vaping Use   Vaping status: Never Used  Substance and Sexual Activity   Alcohol use: No   Drug use: No   Sexual activity: Not on file  Other Topics Concern   Not on file  Social History Narrative   Not on file   Social Drivers of Health   Tobacco Use: Low Risk (08/25/2024)   Patient History    Smoking Tobacco Use: Never    Smokeless Tobacco Use: Never    Passive Exposure: Past  Financial Resource Strain: Low Risk (06/19/2024)   Overall Financial Resource Strain (CARDIA)    Difficulty of Paying Living Expenses: Not hard at all  Food  Insecurity: No Food Insecurity (06/19/2024)   Epic    Worried About Programme Researcher, Broadcasting/film/video in the Last Year: Never true    Ran Out of Food in the Last Year: Never true  Transportation Needs: No Transportation Needs (06/19/2024)   Epic    Lack of Transportation (Medical): No    Lack of Transportation (Non-Medical): No  Physical Activity: Insufficiently Active (06/19/2024)   Exercise Vital Sign    Days of Exercise per Week: 2 days    Minutes of Exercise per Session: 20 min  Stress: Stress Concern Present (06/19/2024)   [employer] of Occupational Health - Occupational Stress Questionnaire    Feeling of Stress: Very much  Social Connections: Unknown (06/19/2024)   Social Connection and Isolation Panel    Frequency of Communication with Friends and Family: Once a week    Frequency of Social Gatherings with Friends and Family: Once a week    Attends Religious Services: Patient declined    Database Administrator or Organizations: Patient declined    Attends Banker Meetings: Not on file    Marital Status: Married  Intimate Partner Violence: Not At Risk (01/23/2024)   Humiliation, Afraid, Rape, and Kick questionnaire    Fear of Current or Ex-Partner: No    Emotionally Abused: No  Physically Abused: No    Sexually Abused: No  Depression (PHQ2-9): Medium Risk (07/21/2024)   Depression (PHQ2-9)    PHQ-2 Score: 10  Alcohol Screen: Not on file  Housing: Low Risk (06/19/2024)   Epic    Unable to Pay for Housing in the Last Year: No    Number of Times Moved in the Last Year: 0    Homeless in the Last Year: No  Utilities: Not At Risk (01/23/2024)   [employer] Utilities    Threatened with loss of utilities: No  Health Literacy: Not on file   Family History  Problem Relation Age of Onset   Diabetes Mother    Hypertension Mother    Stroke Mother    Hypertrophic cardiomyopathy Mother    Hyperlipidemia Father    Hypertension Father    Heart attack Father    Diabetes Father     Colon cancer Neg Hx    Stomach cancer Neg Hx     Objective: Office vital signs reviewed. BP 118/70   Pulse 68   Temp (!) 97.4 F (36.3 C)   Ht 5' 10 (1.778 m)   Wt 182 lb (82.6 kg)   SpO2 98%   BMI 26.11 kg/m   Physical Examination:  General: Awake, alert, well nourished, No acute distress HEENT: sclera white, MMM Psych: Mood stable, speech normal, affect appropriate.     08/25/2024    2:06 PM 07/21/2024   10:09 AM 06/19/2024   11:34 AM  Depression screen PHQ 2/9  Decreased Interest 3 1 2   Down, Depressed, Hopeless 3 1 2   PHQ - 2 Score 6 2 4   Altered sleeping 2 2 1   Tired, decreased energy 2 3 2   Change in appetite 0 0 1  Feeling bad or failure about yourself  2 3 2   Trouble concentrating 1 0 1  Moving slowly or fidgety/restless 0 0 1  Suicidal thoughts 0 0 0  PHQ-9 Score 13 10 12    Difficult doing work/chores Somewhat difficult Very difficult Very difficult     Data saved with a previous flowsheet row definition      08/25/2024    2:07 PM 07/21/2024   10:09 AM 06/19/2024   11:35 AM 05/18/2024    2:53 PM  GAD 7 : Generalized Anxiety Score  Nervous, Anxious, on Edge 2 1 2 2   Control/stop worrying 2 3 2 3   Worry too much - different things 2 3 2 3   Trouble relaxing 1 2 2 3   Restless 1 0 1 0  Easily annoyed or irritable 0 0 1 1  Afraid - awful might happen 1 3 2 2   Total GAD 7 Score 9 12 12 14   Anxiety Difficulty Somewhat difficult Very difficult Very difficult Very difficult    Assessment/ Plan: 58 y.o. male   GAD (generalized anxiety disorder)  Depression, recurrent  Hypogonadism in male   For now we will continue Zoloft  at current regimen.  However, we will be glad to increase dose.  I do question if perhaps this newly found low testosterone  has in fact been what has been mediating a lot of his mood disorder and generalized muscle fatigability.  I am very excited to see how he will respond to intramuscular testosterone  and so I have recommended  that we reconcede in about 8 to 12 weeks, sooner if concerns arise.   Norene CHRISTELLA Fielding, DO [facility] [phone]     [1] No Known Allergies [2]  Current Outpatient  Medications:    ALPRAZolam  (XANAX ) 1 MG tablet, Take 0.5-1 tablets (0.5-1 mg total) by mouth 2 (two) times daily as needed for anxiety (place on hold)., Disp: 60 tablet, Rfl: 2   Cholecalciferol (VITAMIN D3) 50 MCG (2000 UT) TABS, Take by mouth., Disp: , Rfl:    fenofibrate  160 MG tablet, TAKE ONE TABLET BY MOUTH EVERY DAY, Disp: 90 tablet, Rfl: 5   hydrocortisone  (ANUSOL -HC) 2.5 % rectal cream, PLACE 1 APPLICATORFUL IN RECTUM 2 TIMES A DAY AS NEEDED FOR HEMORRHOIDS, Disp: 30 g, Rfl: 0   ibuprofen (ADVIL) 200 MG tablet, Take 200 mg by mouth every 6 (six) hours as needed., Disp: , Rfl:    ketoconazole (NIZORAL) 2 % cream, Apply 1 Application topically daily as needed for irritation., Disp: , Rfl:    levothyroxine  (SYNTHROID ) 137 MCG tablet, TAKE ONE TABLET DAILY EXCEPT ON SATURDAY AND SUNDAY, Disp: 90 tablet, Rfl: 5   losartan -hydrochlorothiazide  (HYZAAR) 50-12.5 MG tablet, Take 1 tablet by mouth daily., Disp: 90 tablet, Rfl: 3   metoprolol  succinate (TOPROL  XL) 25 MG 24 hr tablet, Take 0.5 tablets (12.5 mg total) by mouth daily., Disp: 45 tablet, Rfl: 3   pantoprazole  (PROTONIX ) 40 MG tablet, TAKE ONE TABLET BY MOUTH DAILY, Disp: 90 tablet, Rfl: 1   sertraline  (ZOLOFT ) 50 MG tablet, Take 1 tablet (50 mg total) by mouth daily. To replace seroquel , Disp: 90 tablet, Rfl: 0   triamcinolone  (KENALOG ) 0.025 % cream, Apply topically daily., Disp: , Rfl:    rosuvastatin  (CRESTOR ) 40 MG tablet, Take 1 tablet (40 mg total) by mouth daily. (Patient not taking: Reported on 08/25/2024), Disp: 90 tablet, Rfl: 3

## 2024-09-14 ENCOUNTER — Other Ambulatory Visit: Payer: Self-pay | Admitting: Family Medicine

## 2024-09-14 DIAGNOSIS — F411 Generalized anxiety disorder: Secondary | ICD-10-CM

## 2024-10-13 ENCOUNTER — Other Ambulatory Visit: Payer: Self-pay | Admitting: Family Medicine

## 2024-10-13 DIAGNOSIS — F339 Major depressive disorder, recurrent, unspecified: Secondary | ICD-10-CM

## 2024-10-13 DIAGNOSIS — F411 Generalized anxiety disorder: Secondary | ICD-10-CM

## 2024-11-23 ENCOUNTER — Ambulatory Visit: Admitting: Family Medicine
